# Patient Record
Sex: Female | Born: 1970
Health system: Southern US, Community
[De-identification: ages and names within clinical notes are randomized; demographics above are authoritative.]

## PROBLEM LIST (undated history)

## (undated) DIAGNOSIS — N390 Urinary tract infection, site not specified: Secondary | ICD-10-CM

## (undated) DIAGNOSIS — I10 Essential (primary) hypertension: Secondary | ICD-10-CM

## (undated) HISTORY — DX: Essential (primary) hypertension: I10

---

## 1999-06-07 ENCOUNTER — Inpatient Hospital Stay (HOSPITAL_COMMUNITY): Admission: AD | Admit: 1999-06-07 | Discharge: 1999-06-07 | Payer: Self-pay | Admitting: Obstetrics and Gynecology

## 1999-06-12 ENCOUNTER — Inpatient Hospital Stay (HOSPITAL_COMMUNITY): Admission: AD | Admit: 1999-06-12 | Discharge: 1999-06-14 | Payer: Self-pay | Admitting: Obstetrics and Gynecology

## 1999-07-17 ENCOUNTER — Other Ambulatory Visit: Admission: RE | Admit: 1999-07-17 | Discharge: 1999-07-17 | Payer: Self-pay | Admitting: Obstetrics and Gynecology

## 2000-05-20 ENCOUNTER — Encounter: Admission: RE | Admit: 2000-05-20 | Discharge: 2000-05-20 | Payer: Self-pay | Admitting: *Deleted

## 2000-05-20 ENCOUNTER — Encounter: Payer: Self-pay | Admitting: *Deleted

## 2001-07-16 ENCOUNTER — Other Ambulatory Visit: Admission: RE | Admit: 2001-07-16 | Discharge: 2001-07-16 | Payer: Self-pay | Admitting: Obstetrics and Gynecology

## 2002-08-05 ENCOUNTER — Other Ambulatory Visit: Admission: RE | Admit: 2002-08-05 | Discharge: 2002-08-05 | Payer: Self-pay | Admitting: Obstetrics & Gynecology

## 2003-09-19 ENCOUNTER — Other Ambulatory Visit: Admission: RE | Admit: 2003-09-19 | Discharge: 2003-09-19 | Payer: Self-pay | Admitting: Obstetrics and Gynecology

## 2004-06-15 ENCOUNTER — Ambulatory Visit: Payer: Self-pay | Admitting: Nurse Practitioner

## 2004-07-13 ENCOUNTER — Ambulatory Visit: Payer: Self-pay | Admitting: Family Medicine

## 2004-07-13 ENCOUNTER — Ambulatory Visit: Payer: Self-pay | Admitting: *Deleted

## 2012-03-21 ENCOUNTER — Emergency Department (HOSPITAL_COMMUNITY)
Admission: EM | Admit: 2012-03-21 | Discharge: 2012-03-21 | Disposition: A | Discharge status: Home / Self Care | Payer: BC Managed Care – PPO | Source: Home / Self Care

## 2012-03-21 ENCOUNTER — Encounter (HOSPITAL_COMMUNITY): Payer: Self-pay | Admitting: *Deleted

## 2012-03-21 DIAGNOSIS — N390 Urinary tract infection, site not specified: Secondary | ICD-10-CM

## 2012-03-21 DIAGNOSIS — M65839 Other synovitis and tenosynovitis, unspecified forearm: Secondary | ICD-10-CM

## 2012-03-21 HISTORY — DX: Urinary tract infection, site not specified: N39.0

## 2012-03-21 LAB — POCT URINALYSIS DIP (DEVICE)
Bilirubin Urine: NEGATIVE
Glucose, UA: NEGATIVE mg/dL
Ketones, ur: NEGATIVE mg/dL
Nitrite: POSITIVE — AB
Protein, ur: 30 mg/dL — AB
Specific Gravity, Urine: 1.02 (ref 1.005–1.030)
Urobilinogen, UA: 1 mg/dL (ref 0.0–1.0)
pH: 6.5 (ref 5.0–8.0)

## 2012-03-21 MED ORDER — CEPHALEXIN 500 MG PO CAPS: 500.0000 mg | ORAL_CAPSULE | Freq: Four times a day (QID) | ORAL | Status: DC

## 2012-03-21 NOTE — ED Provider Notes (Signed)
History     CSN: [id number]  Arrival date & time 03/21/12  1217   First MD Initiated Contact with Patient 03/21/12 1343      Chief Complaint  Patient presents with  . Hand Pain  . Urinary Tract Infection    (Consider location/radiation/quality/duration/timing/severity/associated sxs/prior treatment) HPI Comments: 42 year old female is complaining of pain in the right thumb. 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 These movements are repetitive on an everyday basis. The patient is located on the extensor surface of the and extends to the wrist. There is no history of blunt or other trauma. The pain began approximately 8 days ago  She has complained of dysuria and urinary frequency. She does have a history of urinary tract infections. Denies fever, chills, abdominal pain or pelvic pain.   Past Medical History  Diagnosis Date  . Frequent UTI     History reviewed. No pertinent past surgical history.  No family history on file.  History  Substance Use Topics  . Smoking status: Never Smoker   . Smokeless tobacco: Not on file  . Alcohol Use: No    OB History   Grav Para Term Preterm Abortions TAB SAB Ect Mult Living                  Review of Systems  Constitutional: Negative for fever, chills and activity change.  HENT: Negative.   Respiratory: Negative.   Cardiovascular: Negative.   Genitourinary: Positive for dysuria and frequency. Negative for vaginal bleeding and vaginal discharge.  Musculoskeletal:       As per HPI  Skin: Negative for color change, pallor and rash.  Neurological: Negative.     Allergies  Cyclobenzaprine  Home Medications   Current Outpatient Rx  Name  Route  Sig  Dispense  Refill  . UNKNOWN TO PATIENT      BCPs         . cephALEXin (KEFLEX) 500 MG capsule   Oral   Take 1 capsule (500 mg total) by mouth 4 (four) times daily.   28 capsule   0     BP 146/80  Pulse 77  Temp(Src)  98.9 F (37.2 C) (Oral)  SpO2 100%  LMP 02/29/2012  Physical Exam  Nursing note and vitals reviewed. Constitutional: She is oriented to person, place, and time. She appears well-developed and well-nourished. No distress.  Eyes: EOM are normal.  Neck: Normal range of motion. Neck supple.  Cardiovascular: Normal rate.   Pulmonary/Chest: Effort normal. No respiratory distress.  Musculoskeletal:  Tenderness over the extensor surface of the thumb. There is no deformity or swelling. Positive Finkelstein sign. Distal neurovascular motor sensory is intact. No other digits involved.  Neurological: She is alert and oriented to person, place, and time. No cranial nerve deficit.  Skin: Skin is warm and dry.  Psychiatric: She has a normal mood and affect.    ED Course  Procedures (including critical care time)  Labs Reviewed  POCT URINALYSIS DIP (DEVICE) - Abnormal; Notable for the following:    Hgb urine dipstick SMALL (*)    Protein, ur 30 (*)    Nitrite POSITIVE (*)    Leukocytes, UA TRACE (*)    All other components within normal limits  URINE CULTURE   No results found.   1. Tendinitis of thumb   2. UTI (lower urinary tract infection)       MDM  Keflex 500 mg 4  times a day for 7 days Drink plenty of fluids and stay well-hydrated Apply a splint to the extensor aspect of the right thumb. She is to wear this during work and home activities. Apply ice off and on during the day, before and after work. May take at DL 454 mg every 6-8 hours when necessary pain. The combination of splinting, ice and NSAIDs should help with the pain. Followup with your primary care doctor as needed or if worse may return.        Hayden Rasmussen, NP 03/21/12 1424

## 2012-03-21 NOTE — ED Provider Notes (Signed)
Medical screening examination/treatment/procedure(s) were performed by resident physician or non-physician practitioner and as supervising physician I was immediately available for consultation/collaboration.   Barkley Bruns MD.   Linna Hoff, MD 03/21/12 1539

## 2012-03-21 NOTE — ED Notes (Signed)
C/O right thumb pain extending into hand started 9 days ago; denies injury; c/o swelling to area.  Describes pain as constant "burning" with sharp pains if any movement.  Has taken Advil without much relief.  Also started with urinary urgency 2 days ago with slight dysuria.  Denies any pain at present, but concerned about possible UTI - has hx of frequent UTIs.

## 2012-03-23 LAB — URINE CULTURE
Colony Count: >=100000
Special Requests: NORMAL

## 2012-03-24 NOTE — ED Notes (Signed)
Urine culture: >100,000 colonies E. Coli.  Pt. adequately treated with Keflex. Vassie Moselle 03/24/2012

## 2012-04-15 ENCOUNTER — Other Ambulatory Visit: Payer: Self-pay | Admitting: Obstetrics and Gynecology

## 2012-04-15 DIAGNOSIS — R928 Other abnormal and inconclusive findings on diagnostic imaging of breast: Secondary | ICD-10-CM

## 2012-04-23 ENCOUNTER — Ambulatory Visit
Admission: RE | Admit: 2012-04-23 | Discharge: 2012-04-23 | Disposition: A | Discharge status: Home / Self Care | Payer: BC Managed Care – PPO | Source: Ambulatory Visit | Attending: Obstetrics and Gynecology | Admitting: Obstetrics and Gynecology

## 2012-04-23 DIAGNOSIS — R928 Other abnormal and inconclusive findings on diagnostic imaging of breast: Secondary | ICD-10-CM

## 2012-04-23 IMAGING — MG MM DIAGNOSTIC UNILATERAL R
3 series · 3 of 3 positions shown · non-contrast
Comparison: None.

CLINICAL DATA: The patient returns for evaluation of a possible
masses in the right breast noted on recent baseline screening
mammogram from [HOSPITAL] [facility] dated [DATE].

DIGITAL DIAGNOSTIC RIGHT MAMMOGRAM  AND RIGHT BREAST ULTRASOUND:

[R CC (1 of 2)]
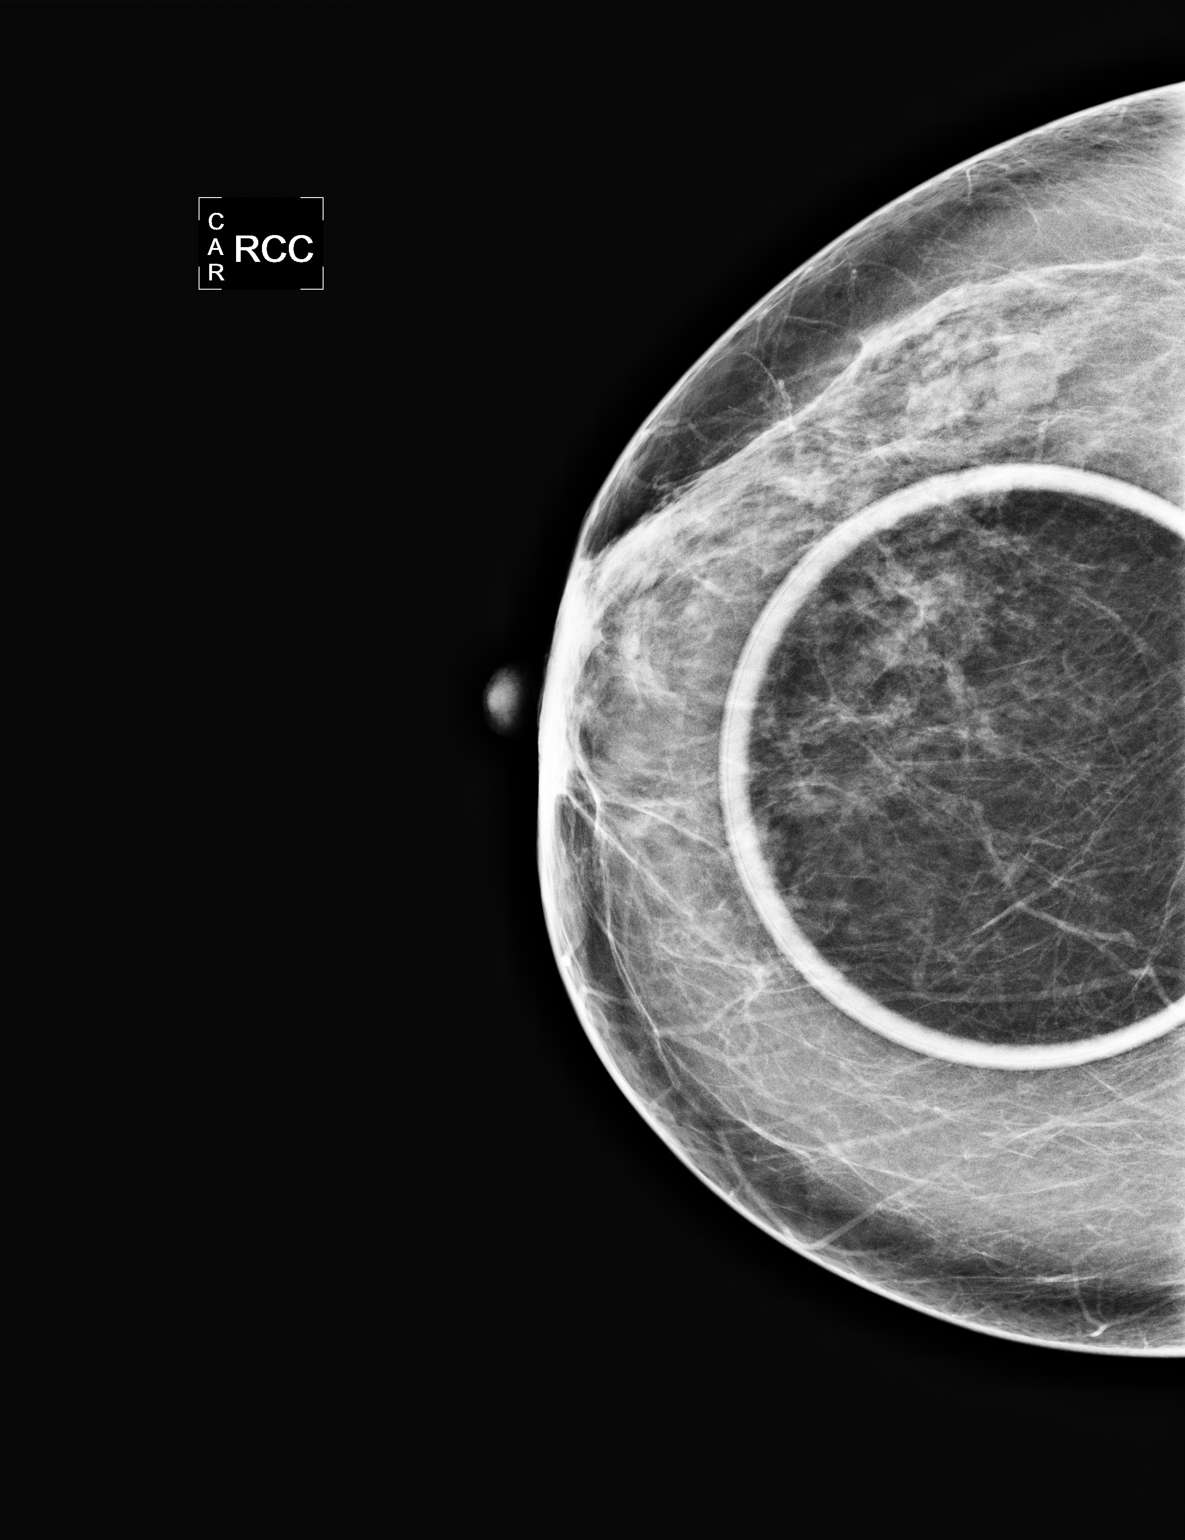

[R MLO]
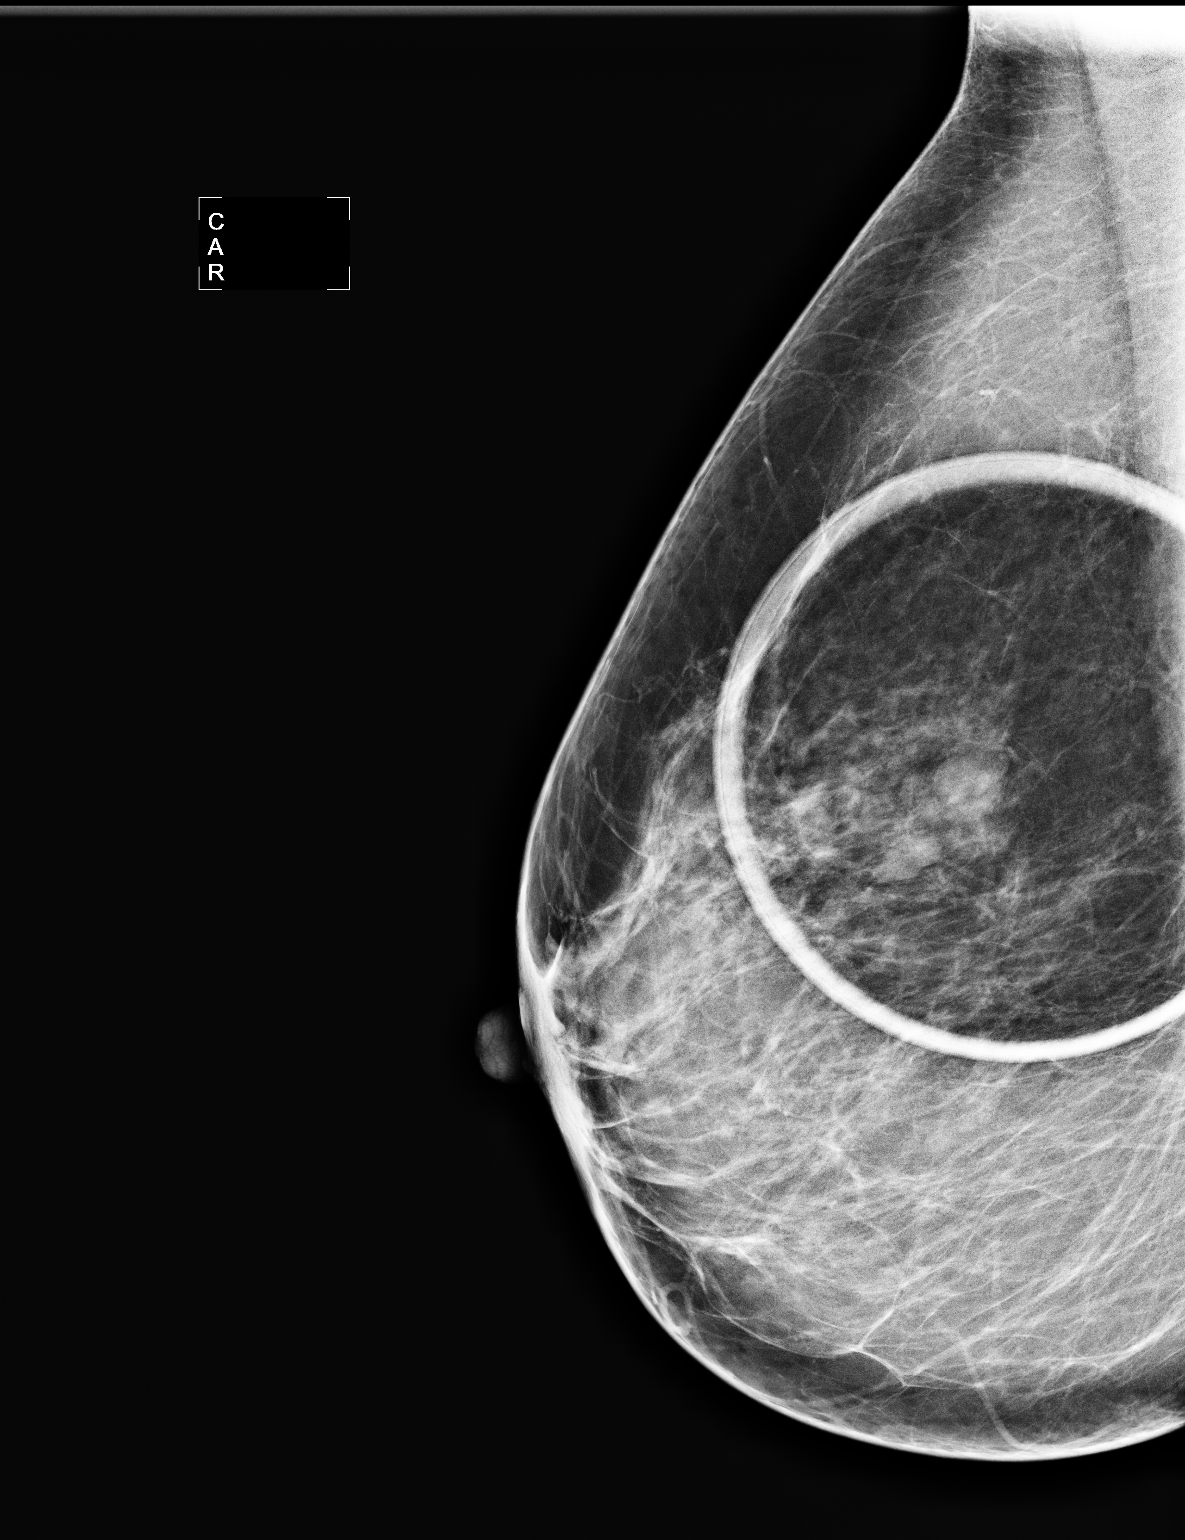

[R CC (2 of 2)]
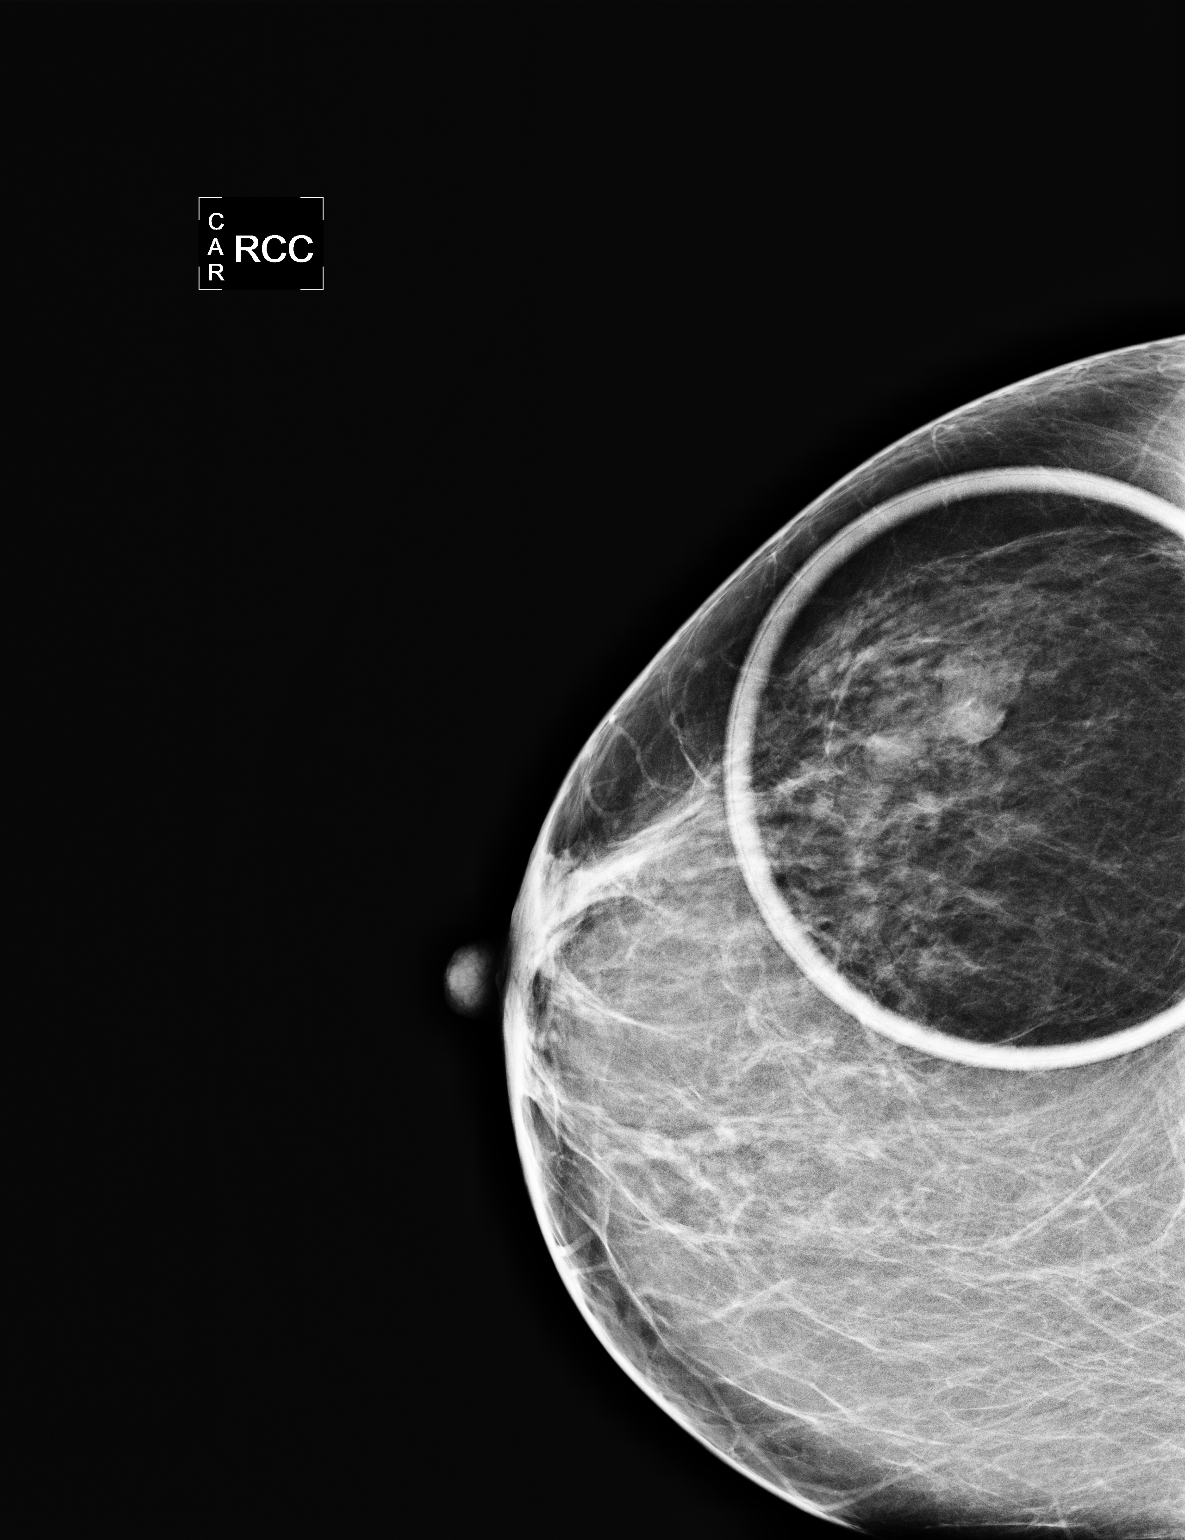

[3 of 3 positions shown; findings below may reference images not displayed]

FINDINGS: ACR Breast Density Category 2: There are scattered fibroglandular
densities.

No persistent worrisome mass is noted in the central portion of the
right breast on the CC view.  Additional views of the outer portion
of the right breast demonstrate multiple partially obscured
partially circumscribed round and oval masses in the mid to
posterior outer breast.

On physical exam, no mass is palpated in the outer portion of the
right breast.

Ultrasound is performed, showing multiple cysts at 9 o'clock in the
right breast with the largest being 6 cm from the nipple measuring
1.1 x 0.5 x 0.7 cm.
IMPRESSION: Right breast cysts.  No mammographic or sonographic evidence of
malignancy.

RECOMMENDATION:
Yearly screening mammography is suggested.

I have discussed the findings and recommendations with the patient.
Results were also provided in writing at the conclusion of the
visit.  If applicable, a reminder letter will be sent to the
patient regarding the next appointment.

BI-RADS CATEGORY 2:  Benign finding(s).

## 2012-04-23 IMAGING — US US BREAST*R*
1 series · 9 of 9 positions shown · non-contrast
Comparison: None.

CLINICAL DATA: The patient returns for evaluation of a possible
masses in the right breast noted on recent baseline screening
mammogram from [HOSPITAL] OB/[facility] dated [DATE].

DIGITAL DIAGNOSTIC RIGHT MAMMOGRAM  AND RIGHT BREAST ULTRASOUND:

[Series 1: us breast*right* · 9 of 9 slices shown]
[im 1/9]
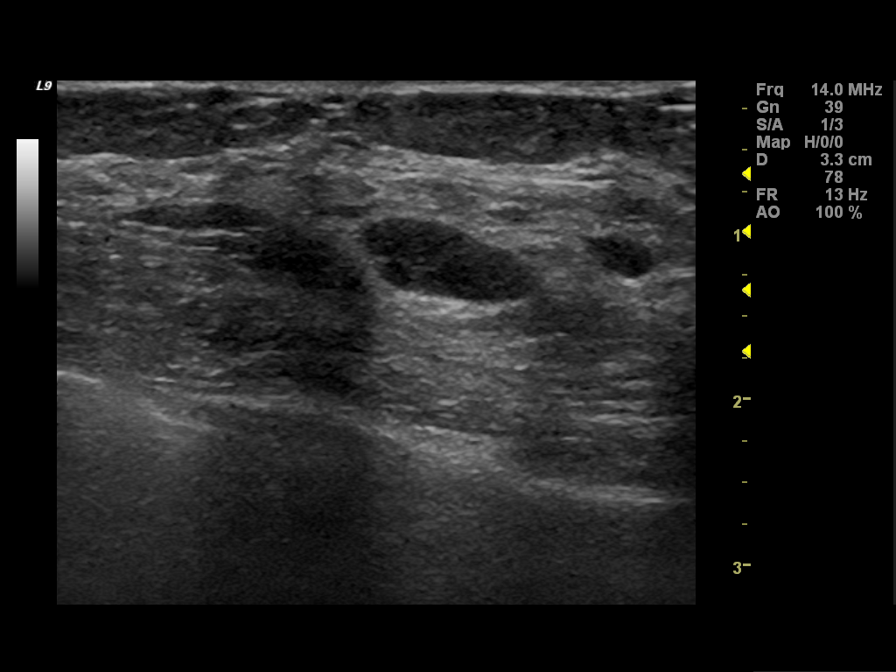
[im 2/9]
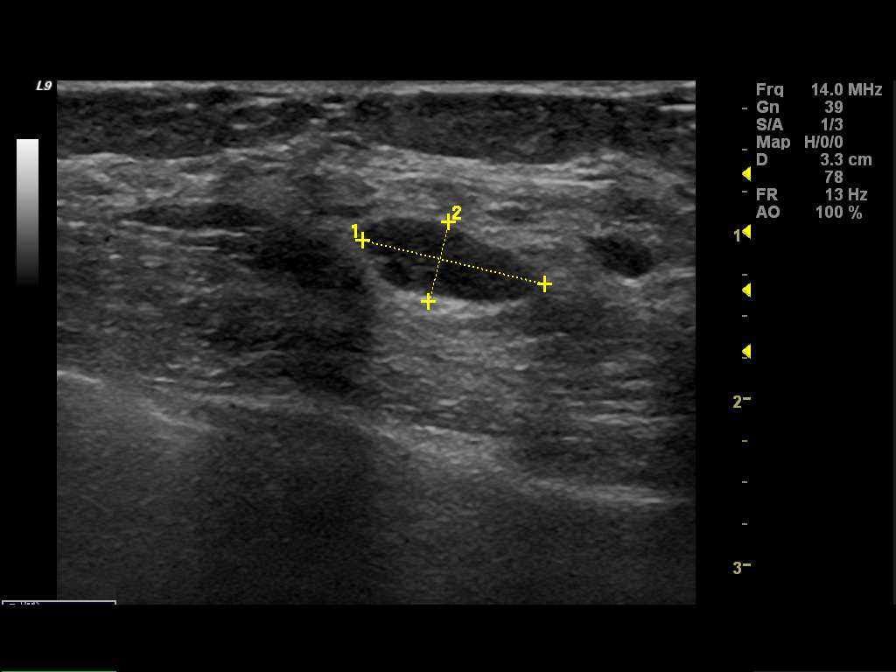
[im 3/9]
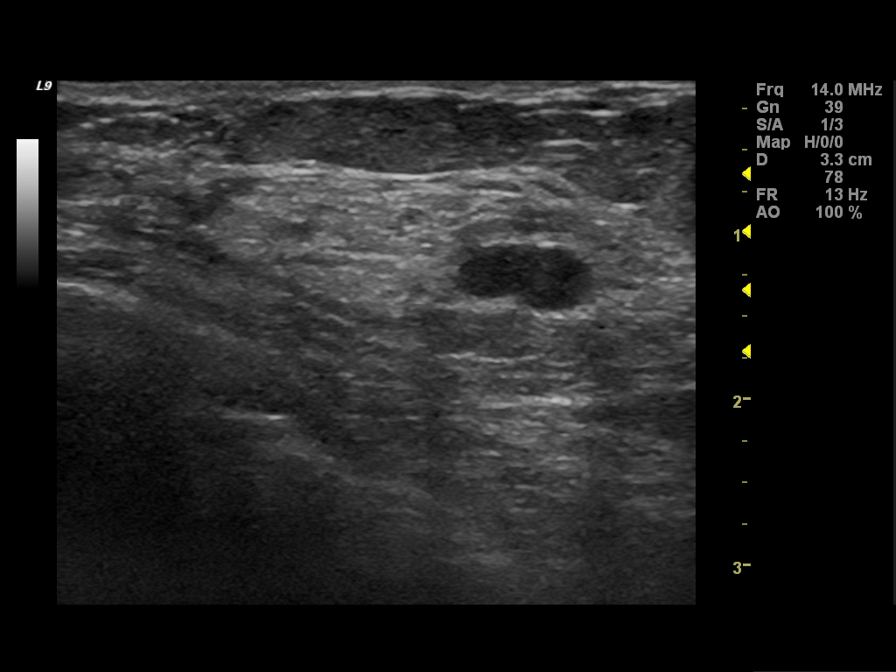
[im 4/9]
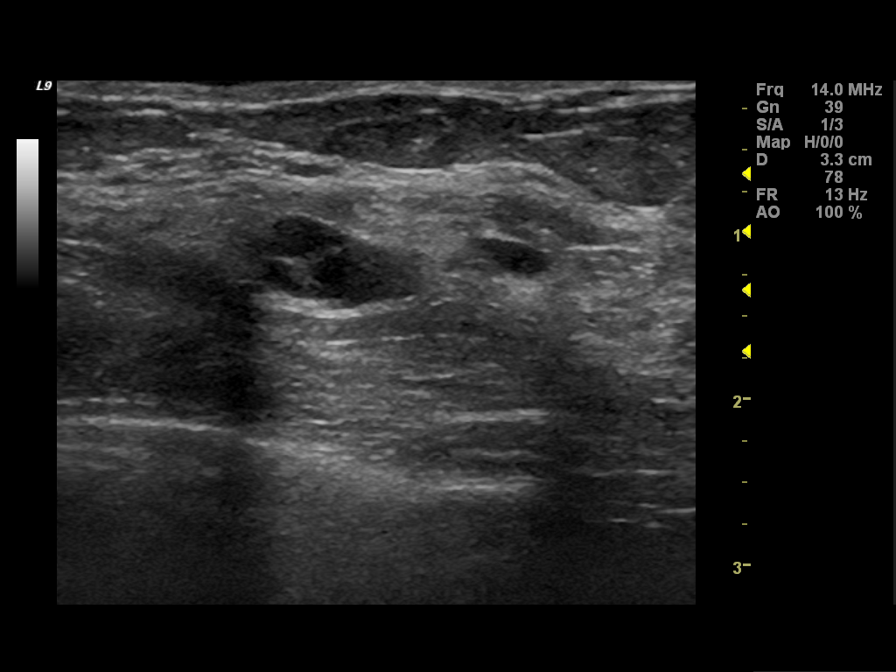
[im 5/9]
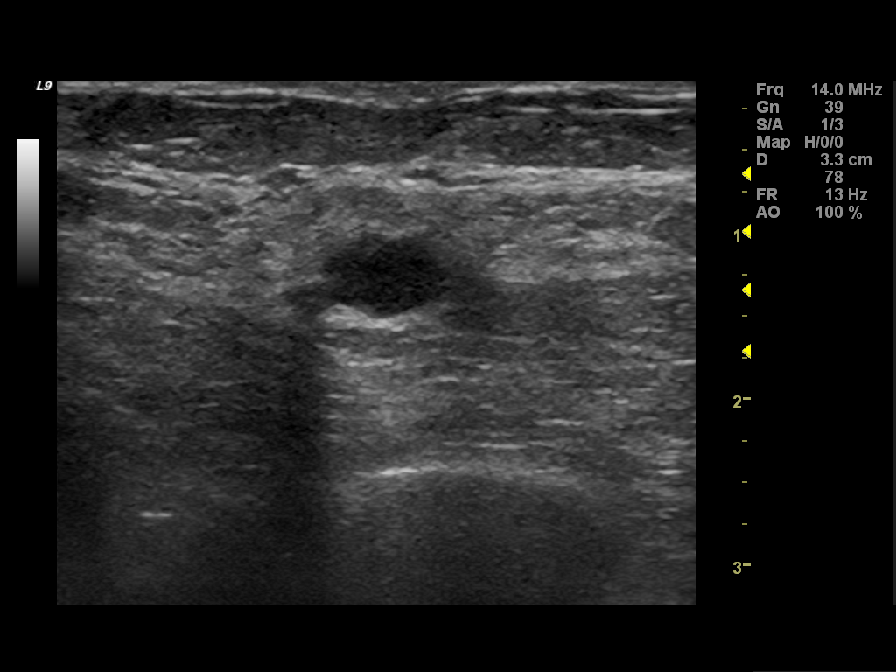
[im 6/9]
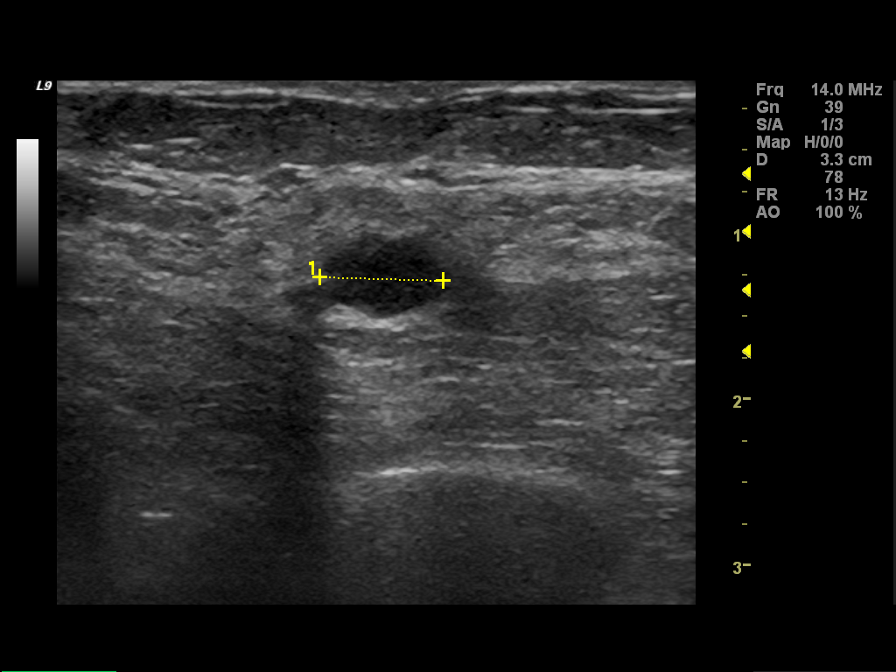
[im 7/9]
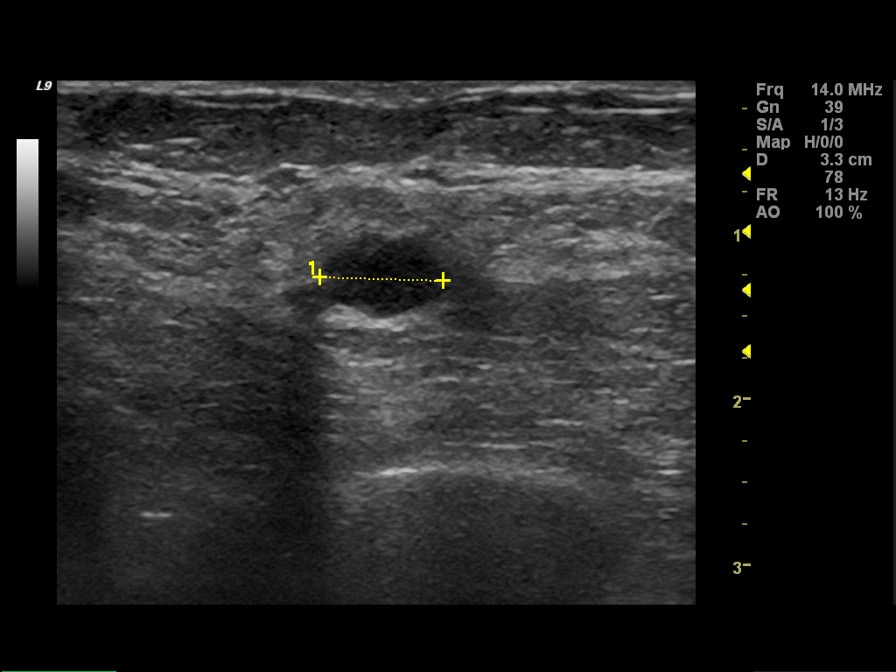
[im 8/9]
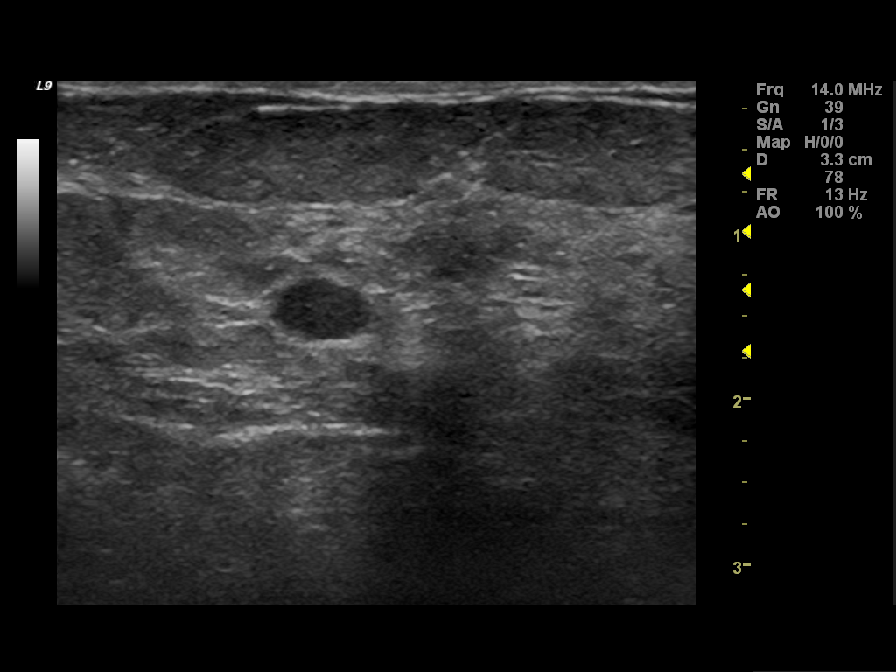
[im 9/9]
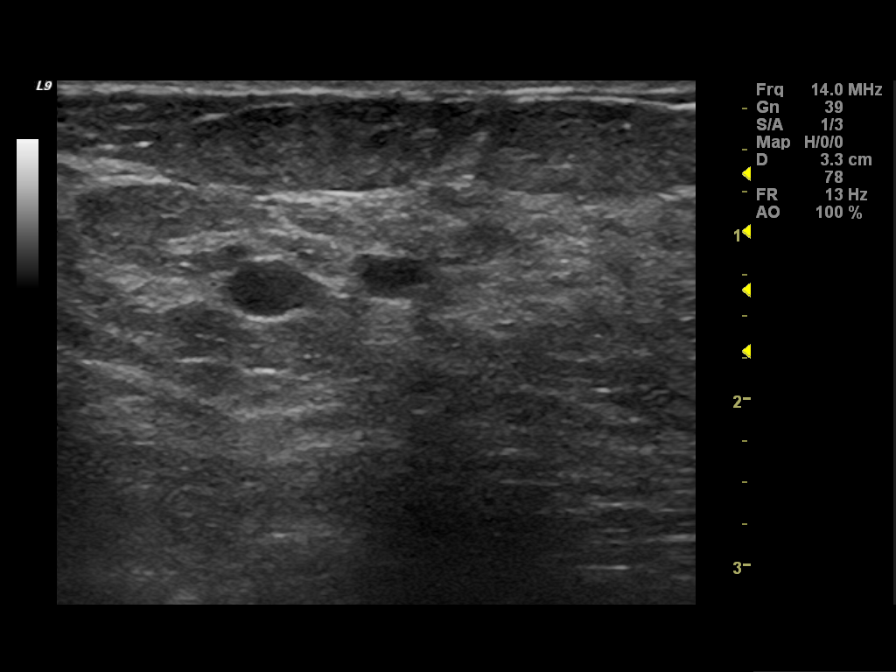

[9 of 9 positions shown; findings below may reference images not displayed]

FINDINGS: ACR Breast Density Category 2: There are scattered fibroglandular
densities.

No persistent worrisome mass is noted in the central portion of the
right breast on the CC view.  Additional views of the outer portion
of the right breast demonstrate multiple partially obscured
partially circumscribed round and oval masses in the mid to
posterior outer breast.

On physical exam, no mass is palpated in the outer portion of the
right breast.

Ultrasound is performed, showing multiple cysts at 9 o'clock in the
right breast with the largest being 6 cm from the nipple measuring
1.1 x 0.5 x 0.7 cm.
IMPRESSION: Right breast cysts.  No mammographic or sonographic evidence of
malignancy.

RECOMMENDATION:
Yearly screening mammography is suggested.

I have discussed the findings and recommendations with the patient.
Results were also provided in writing at the conclusion of the
visit.  If applicable, a reminder letter will be sent to the
patient regarding the next appointment.

BI-RADS CATEGORY 2:  Benign finding(s).

## 2012-10-09 ENCOUNTER — Telehealth: Payer: Self-pay | Admitting: *Deleted

## 2012-10-09 NOTE — Telephone Encounter (Signed)
rx sent

## 2013-03-26 DIAGNOSIS — I10 Essential (primary) hypertension: Secondary | ICD-10-CM | POA: Insufficient documentation

## 2013-04-13 LAB — HM PAP SMEAR: HM PAP: NEGATIVE

## 2014-12-23 DIAGNOSIS — F419 Anxiety disorder, unspecified: Secondary | ICD-10-CM | POA: Insufficient documentation

## 2015-05-17 ENCOUNTER — Other Ambulatory Visit: Payer: Self-pay | Admitting: Obstetrics and Gynecology

## 2015-05-17 DIAGNOSIS — R928 Other abnormal and inconclusive findings on diagnostic imaging of breast: Secondary | ICD-10-CM

## 2015-05-30 ENCOUNTER — Ambulatory Visit
Admission: RE | Admit: 2015-05-30 | Discharge: 2015-05-30 | Disposition: A | Discharge status: Home / Self Care | Payer: BLUE CROSS/BLUE SHIELD | Source: Ambulatory Visit | Attending: Obstetrics and Gynecology | Admitting: Obstetrics and Gynecology

## 2015-05-30 DIAGNOSIS — R928 Other abnormal and inconclusive findings on diagnostic imaging of breast: Secondary | ICD-10-CM

## 2015-05-30 IMAGING — MG MM DIAG BREAST TOMO UNI RIGHT
4 series · 4 of 12 positions shown · non-contrast
Comparison: Previous exam(s).

CLINICAL DATA: The patient was called back due to a growing right
breast mass.

EXAM:
2D DIGITAL DIAGNOSTIC RIGHT MAMMOGRAM WITH ADJUNCT TOMO
ULTRASOUND RIGHT BREAST

[R CC]
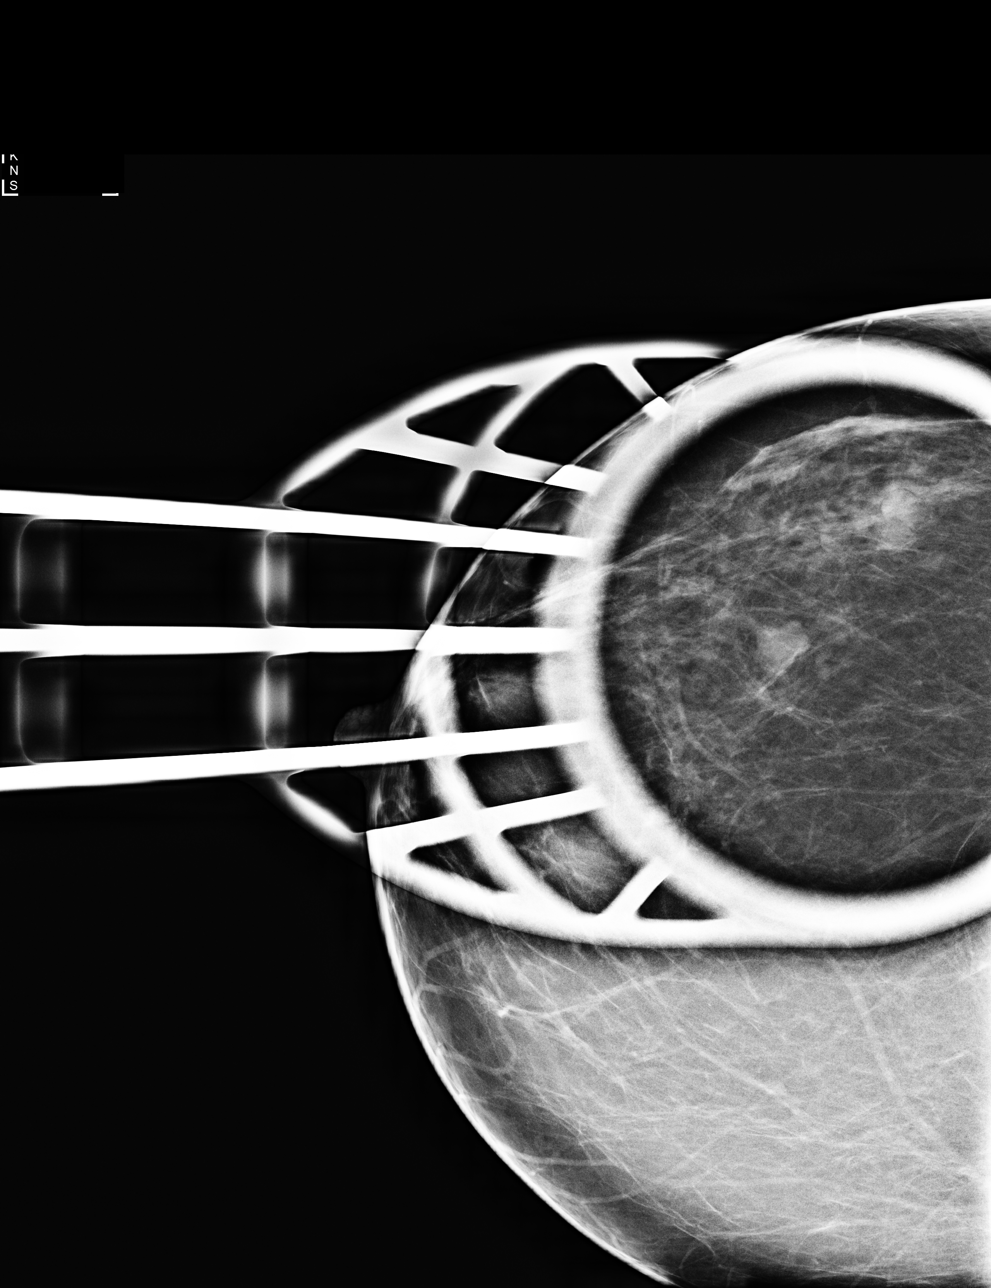

[R MLO]
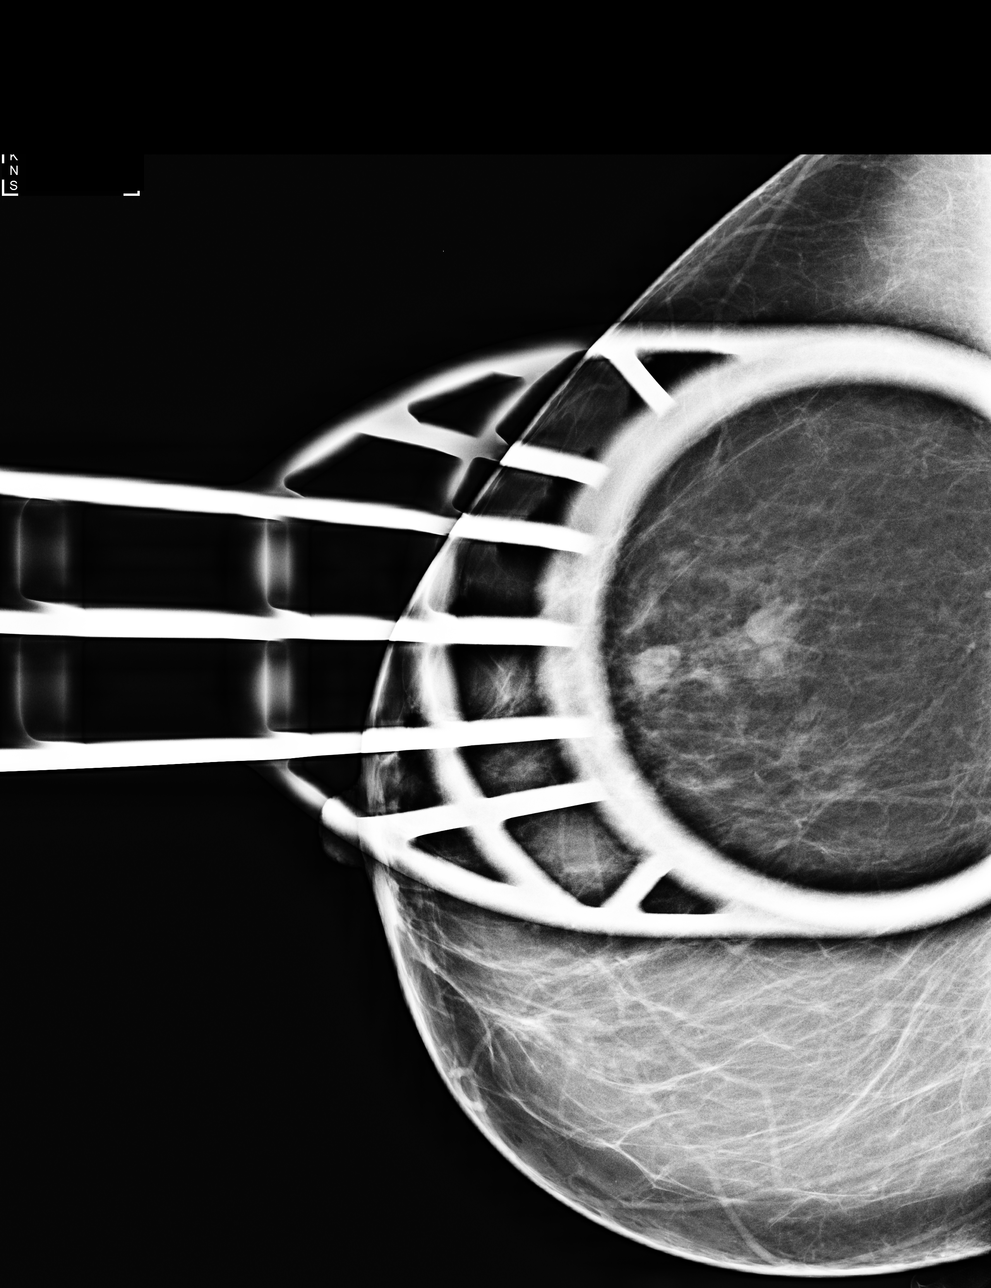

[R MLO tomo · tomo slice 31/61.0]
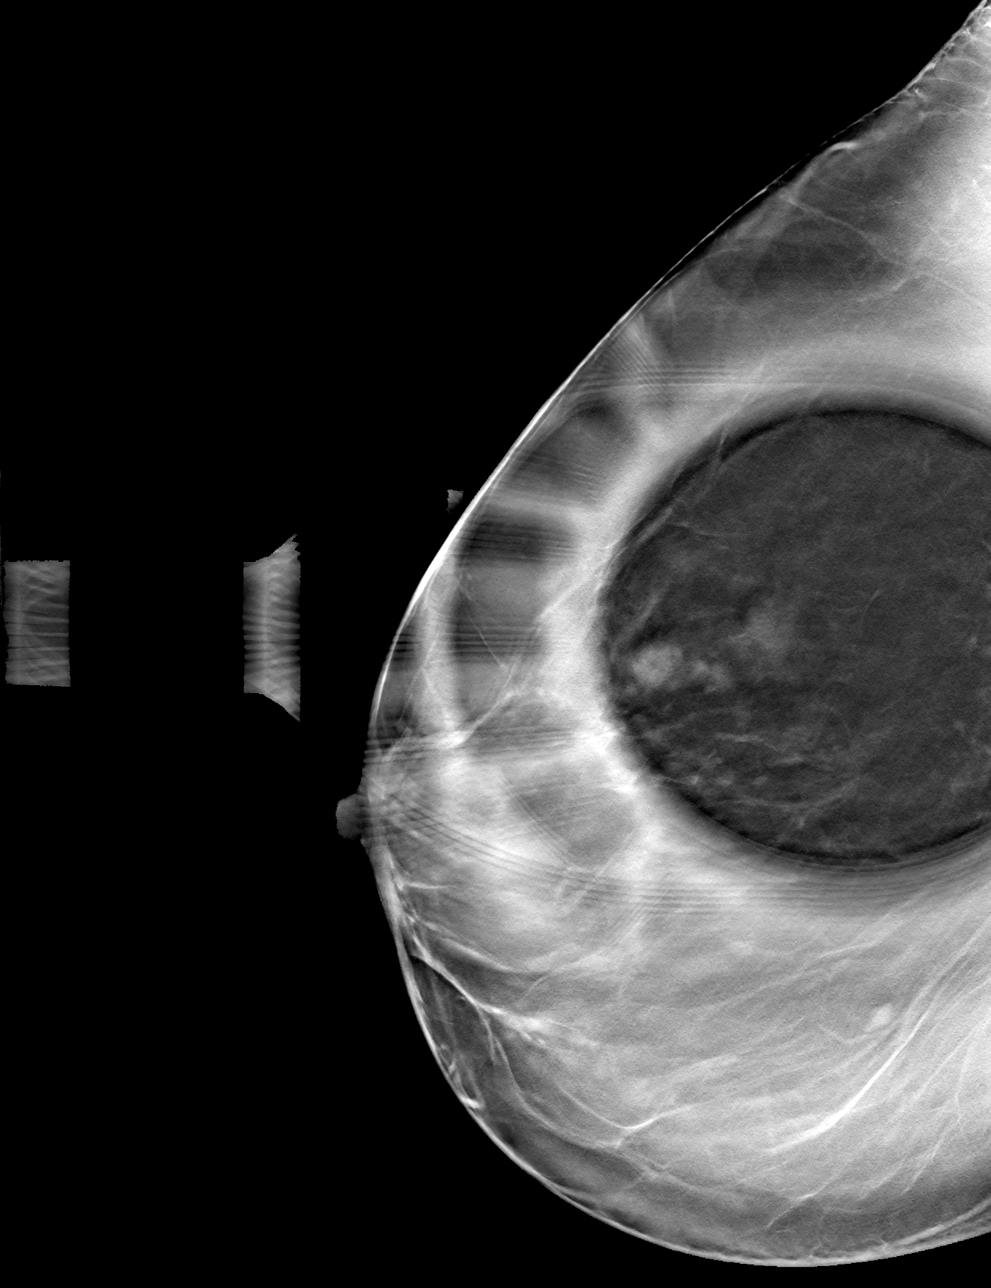

[R CC tomo · tomo slice 31/60.0]
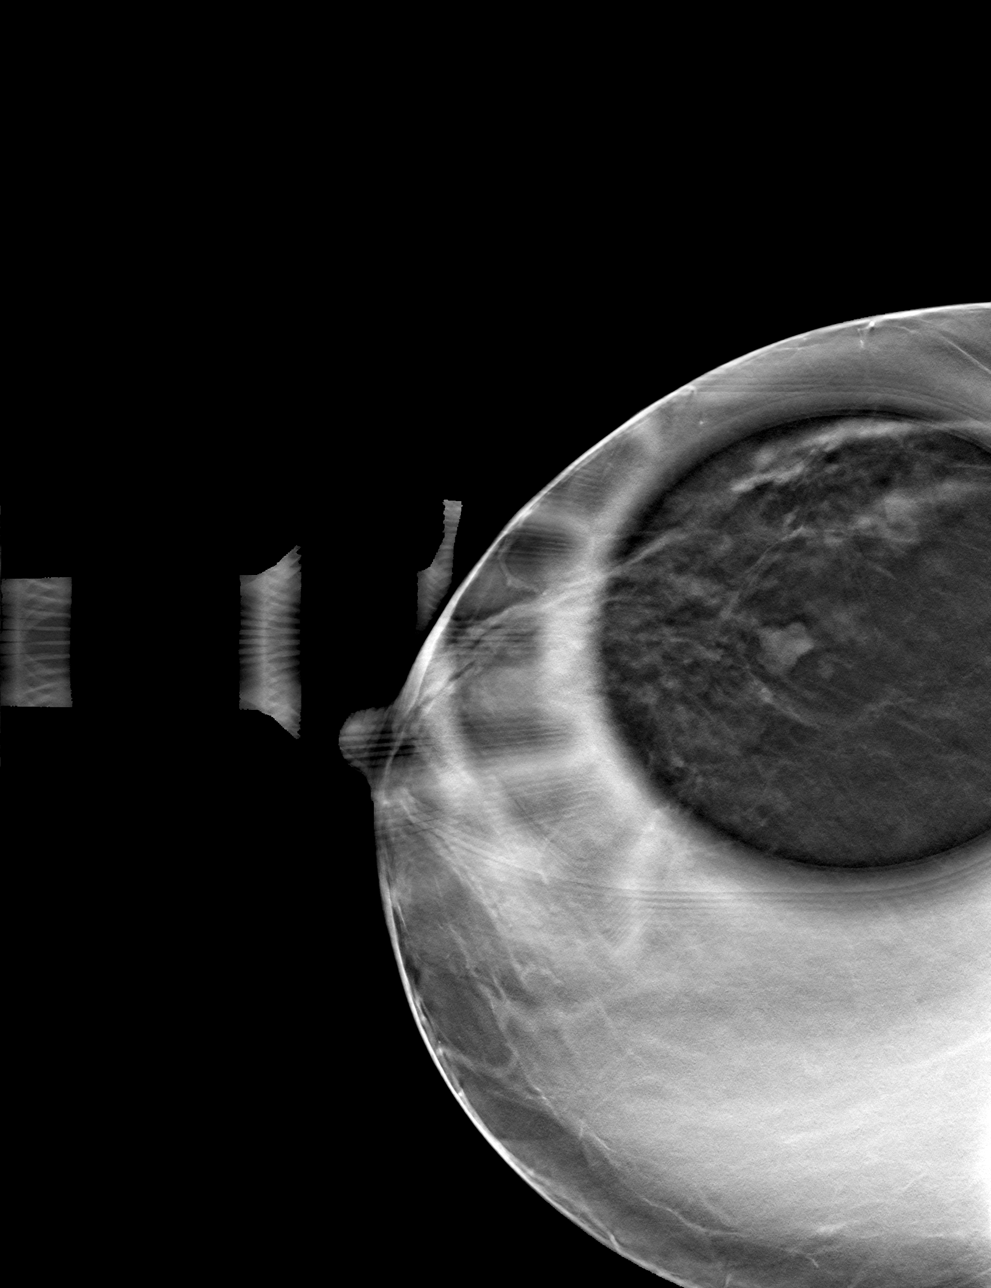

[4 of 12 positions shown; findings below may reference images not displayed]

ACR Breast Density Category b: There are scattered areas of
fibroglandular density.
FINDINGS: There is a cluster of masses in the lateral right breast. Several of
these are stable or smaller since the previous study and were
previously proven to represent cysts. However, there is a mass, a
little more anteriorly in the breast, which is larger in the
interval. No other suspicious findings.

On physical exam, no suspicious lumps are identified

Targeted ultrasound is performed, showing there are multiple cysts
in the lateral right breast. There is also a mass seen at 10
o'clock, 2 cm from the nipple, likely representing the new mass
which appears to represent a cluster of cysts. This mass measures 8
by 9 x 6 mm with increased through transmission.
IMPRESSION: The growing mass in the right breast is almost certainly a cluster
of cysts. No other suspicious findings.

RECOMMENDATION:
Six-month follow-up ultrasound and mammogram to ensure stability of
the probable cluster of cysts.

I have discussed the findings and recommendations with the patient.
Results were also provided in writing at the conclusion of the
visit. If applicable, a reminder letter will be sent to the patient
regarding the next appointment.

BI-RADS CATEGORY  3: Probably benign.

## 2015-07-08 ENCOUNTER — Other Ambulatory Visit: Payer: Self-pay | Admitting: Obstetrics and Gynecology

## 2015-07-08 DIAGNOSIS — N921 Excessive and frequent menstruation with irregular cycle: Secondary | ICD-10-CM

## 2015-07-14 ENCOUNTER — Ambulatory Visit
Admission: RE | Admit: 2015-07-14 | Discharge: 2015-07-14 | Disposition: A | Discharge status: Home / Self Care | Payer: BLUE CROSS/BLUE SHIELD | Source: Ambulatory Visit | Attending: Obstetrics and Gynecology | Admitting: Obstetrics and Gynecology

## 2015-07-14 DIAGNOSIS — N921 Excessive and frequent menstruation with irregular cycle: Secondary | ICD-10-CM

## 2015-07-14 IMAGING — US US PELVIS COMPLETE
1 series · 13 of 25 positions shown · non-contrast
Comparison: None

CLINICAL DATA: Abnormal vaginal bleeding.



[Series 1: us pelvis complete · 0.28mm/px · 13 of 58 slices shown]
[im 1/58]
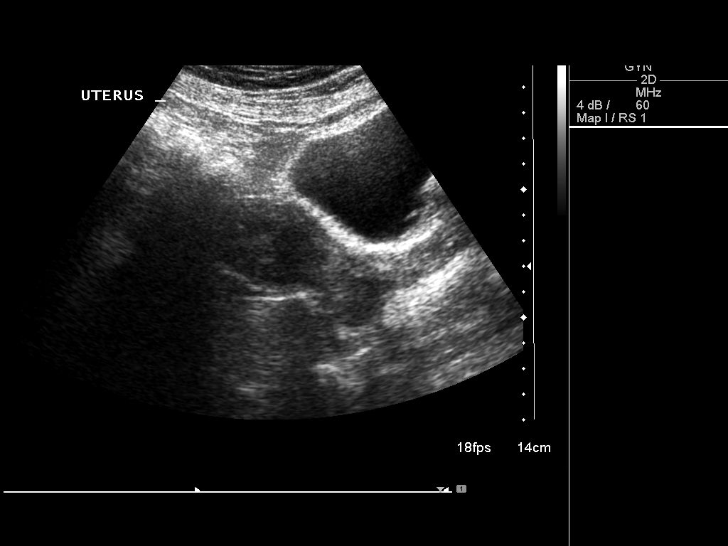
[im 5/58]
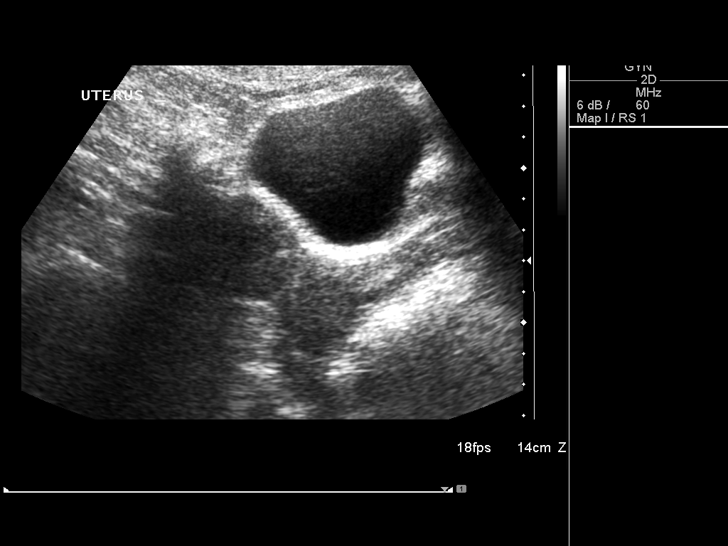
[im 10/58]
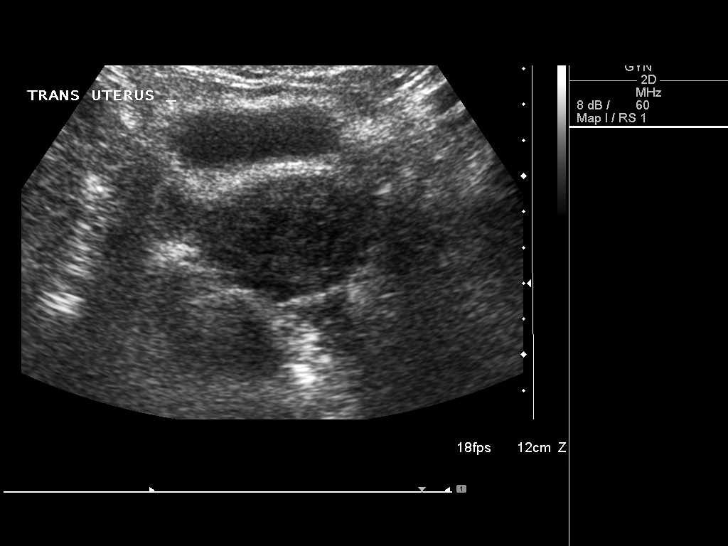
[im 15/58]
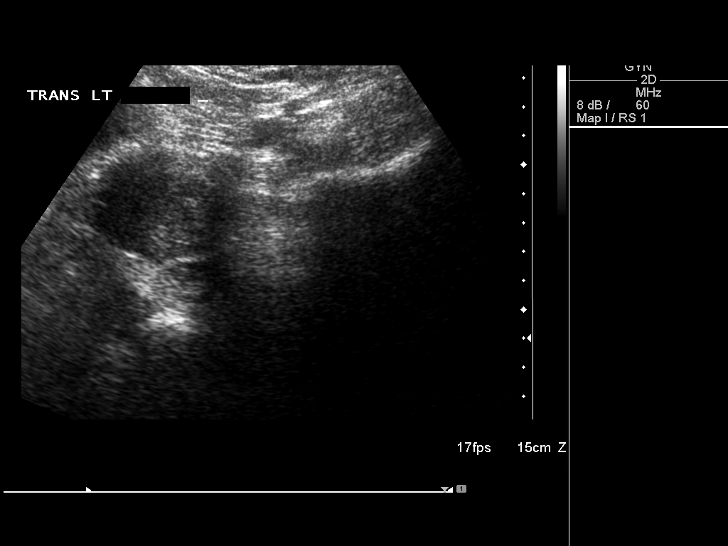
[im 20/58]
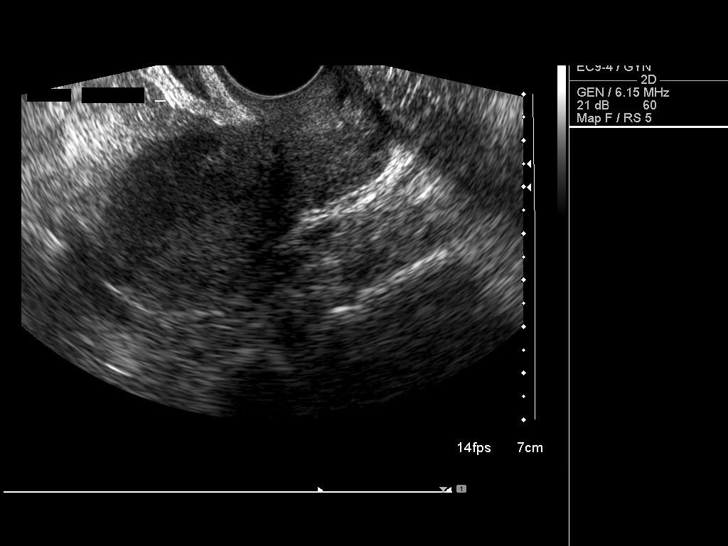
[im 24/58]
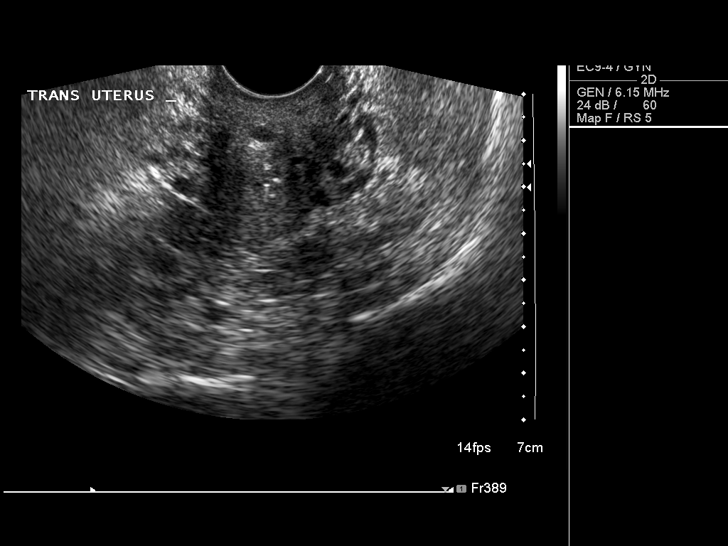
[im 29/58]
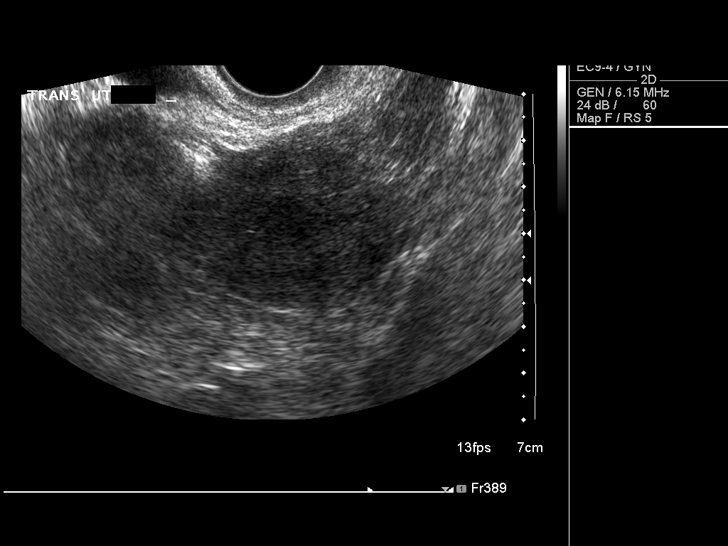
[im 34/58]
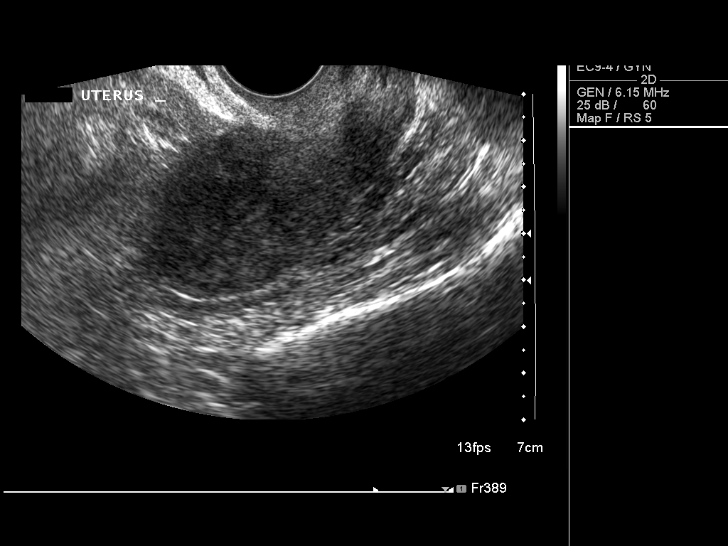
[im 39/58]
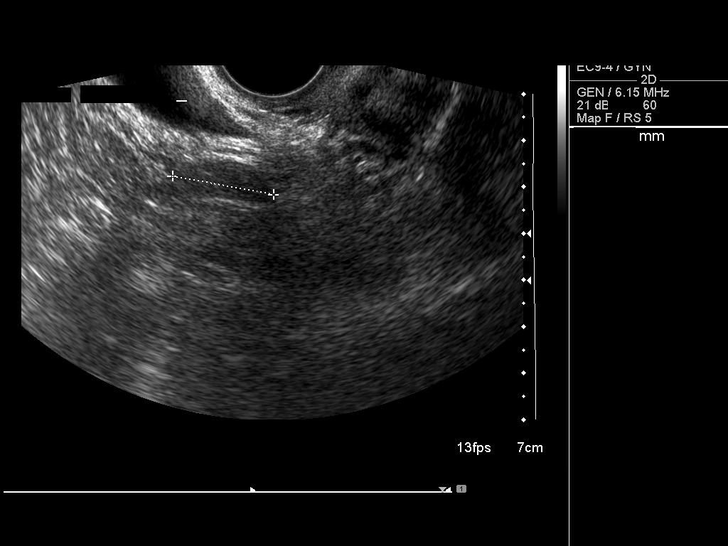
[im 43/58]
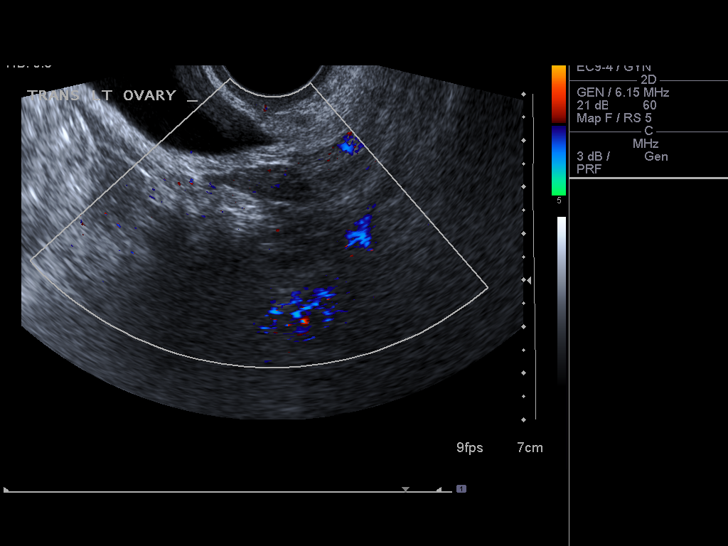
[im 48/58]
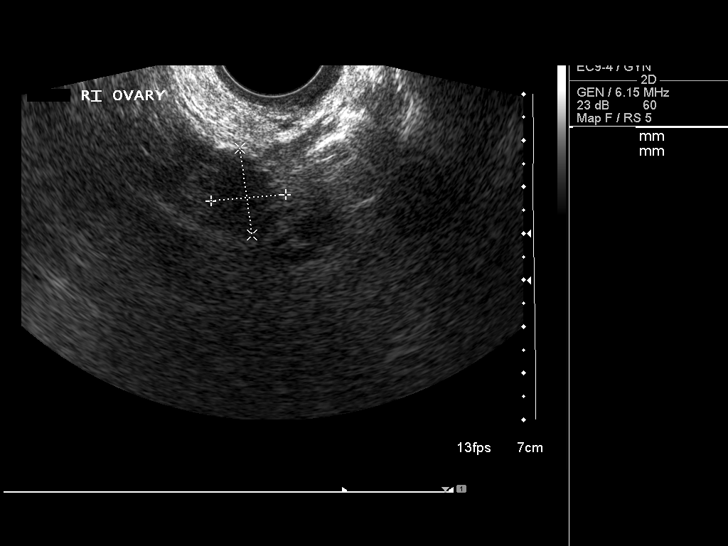
[im 53/58]
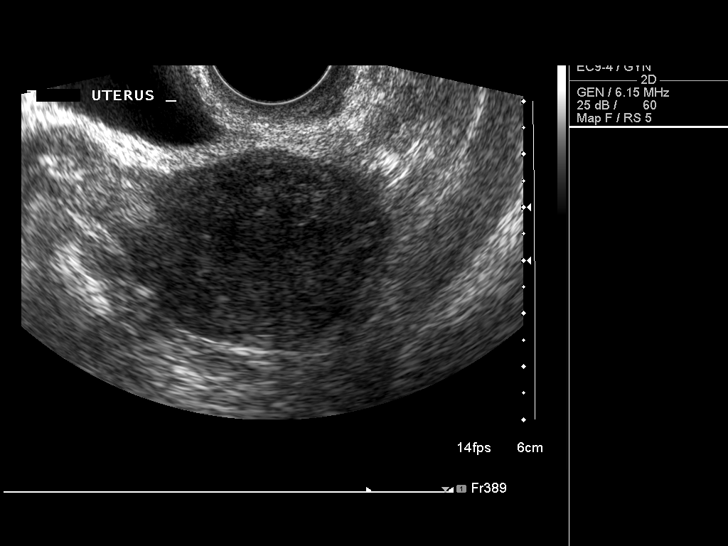
[im 58/58]
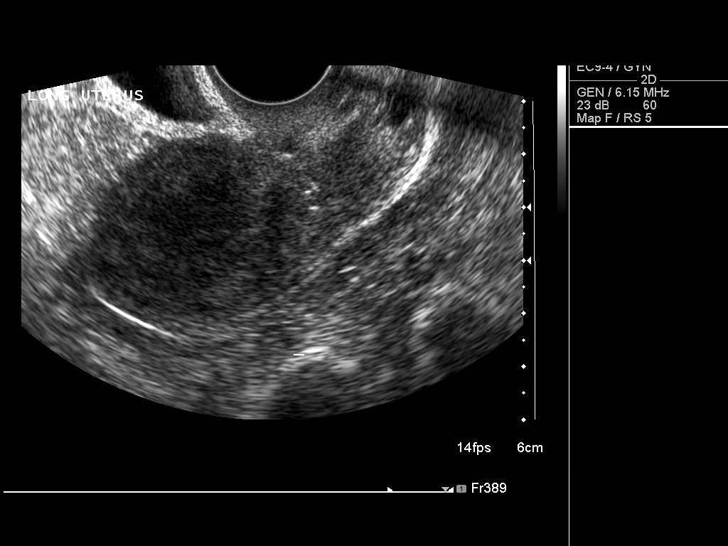

[13 of 25 positions shown; findings below may reference images not displayed]

FINDINGS: Uterus

Measurements: 7.8 x 3.6 x 5.3 cm. No discrete fibroids evident.
Myometrial echotexture is heterogeneous, raising the question of
adenomyosis.

Endometrium

Thickness: 7 mm.  No focal abnormality visualized.

Right ovary

Measurements: 1.6 x 1.9 x 2.4 cm. Normal appearance/no adnexal mass.

Left ovary

Measurements: 2.2 x 0.9 x 1.7 cm. Normal appearance/no adnexal mass.

Other findings

No abnormal free fluid.
IMPRESSION: Heterogeneous myometrial echotexture suggests adenomyosis. No
evidence for uterine fibroids.

Normal endometrial stripe thickness for premenopausal female. If
bleeding remains unresponsive to hormonal or medical therapy,
sonohysterogram should be considered for focal lesion work-up. (Ref:
Radiological Reasoning: Algorithmic Workup of Abnormal Vaginal
Bleeding with Endovaginal Sonography and Sonohysterography. AJR
[2F]; 191:S68-73)

## 2015-08-15 DIAGNOSIS — R350 Frequency of micturition: Secondary | ICD-10-CM | POA: Diagnosis not present

## 2015-08-15 DIAGNOSIS — Z6827 Body mass index (BMI) 27.0-27.9, adult: Secondary | ICD-10-CM | POA: Diagnosis not present

## 2015-11-09 ENCOUNTER — Other Ambulatory Visit: Payer: Self-pay | Admitting: Obstetrics and Gynecology

## 2015-11-09 DIAGNOSIS — N6001 Solitary cyst of right breast: Secondary | ICD-10-CM

## 2015-12-01 ENCOUNTER — Ambulatory Visit
Admission: RE | Admit: 2015-12-01 | Discharge: 2015-12-01 | Disposition: A | Discharge status: Home / Self Care | Payer: BLUE CROSS/BLUE SHIELD | Source: Ambulatory Visit | Attending: Obstetrics and Gynecology | Admitting: Obstetrics and Gynecology

## 2015-12-01 DIAGNOSIS — N6321 Unspecified lump in the left breast, upper outer quadrant: Secondary | ICD-10-CM | POA: Diagnosis not present

## 2015-12-01 DIAGNOSIS — N6001 Solitary cyst of right breast: Secondary | ICD-10-CM

## 2015-12-01 DIAGNOSIS — N6489 Other specified disorders of breast: Secondary | ICD-10-CM | POA: Diagnosis not present

## 2015-12-01 IMAGING — MG 2D DIGITAL DIAGNOSTIC UNILATERAL RIGHT MAMMOGRAM WITH CAD AND AD
6 series · 6 of 14 positions shown · non-contrast
Comparison: Previous exam(s).

CLINICAL DATA: Follow-up of probably benign right breast upper
outer quadrant mass.

EXAM:
2D DIGITAL DIAGNOSTIC RIGHT MAMMOGRAM WITH CAD AND ADJUNCT TOMO
ULTRASOUND RIGHT BREAST

[R CC synth-2D]
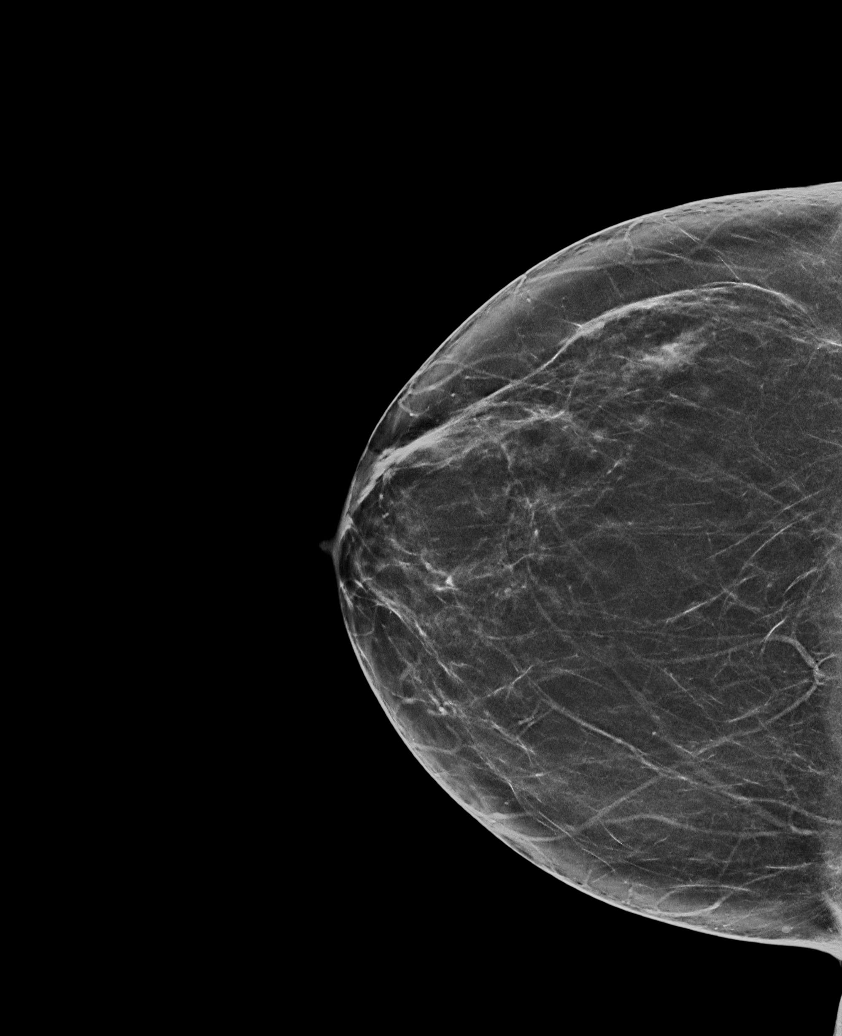

[R MLO synth-2D]
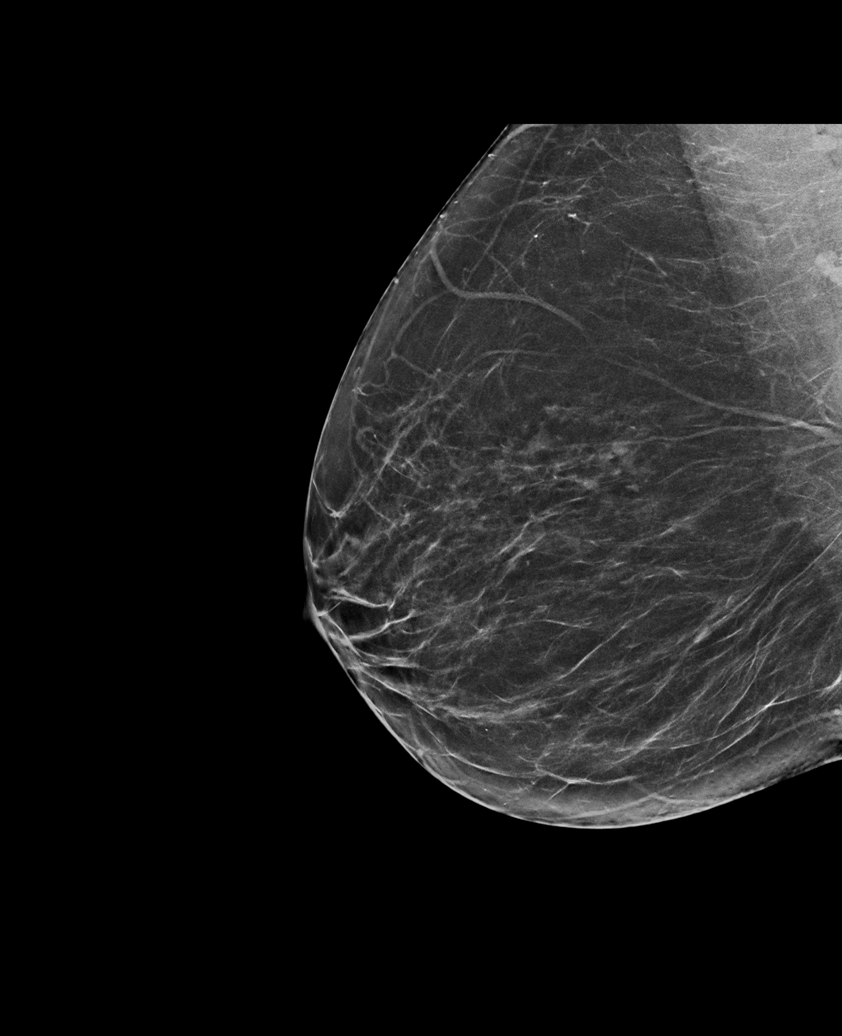

[R CC]
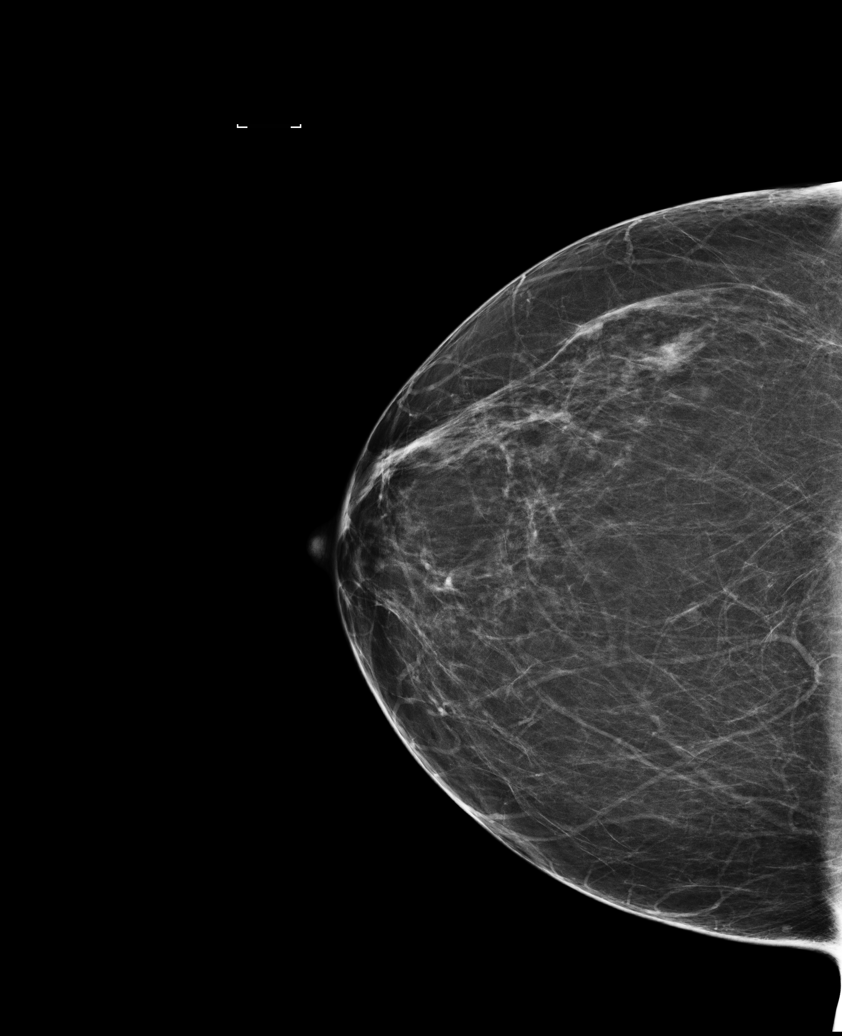

[R MLO]
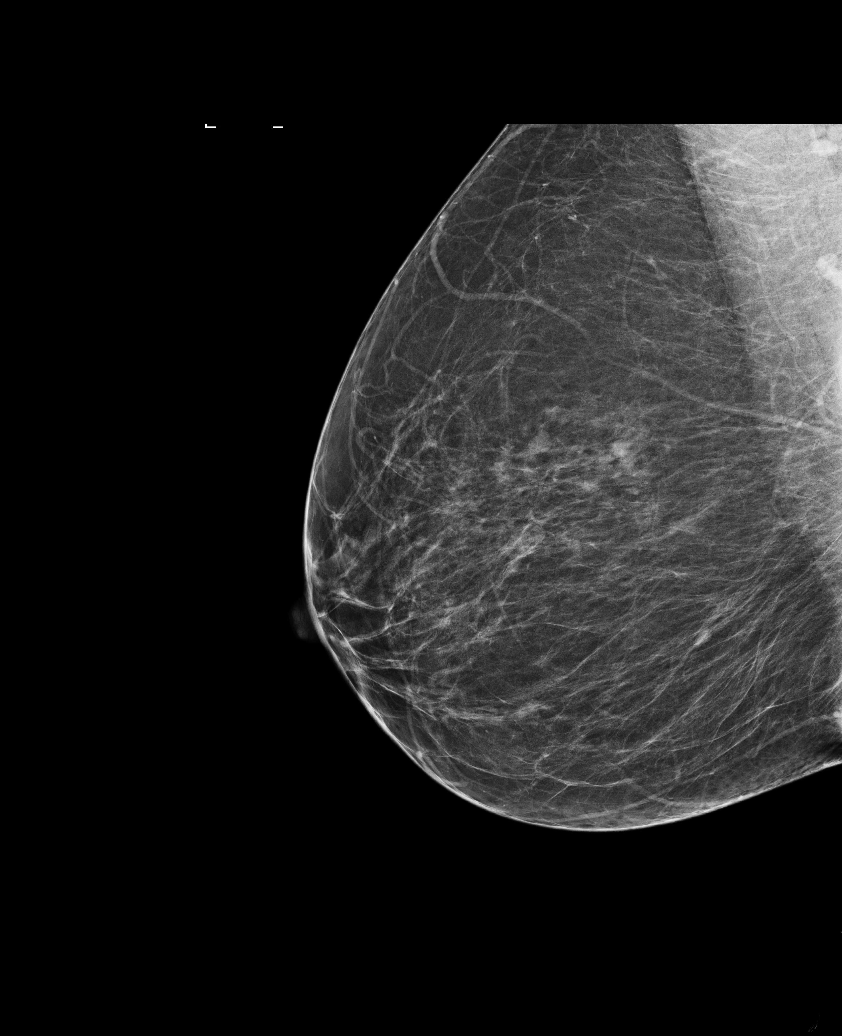

[R CC tomo · tomo slice 35/69.0]
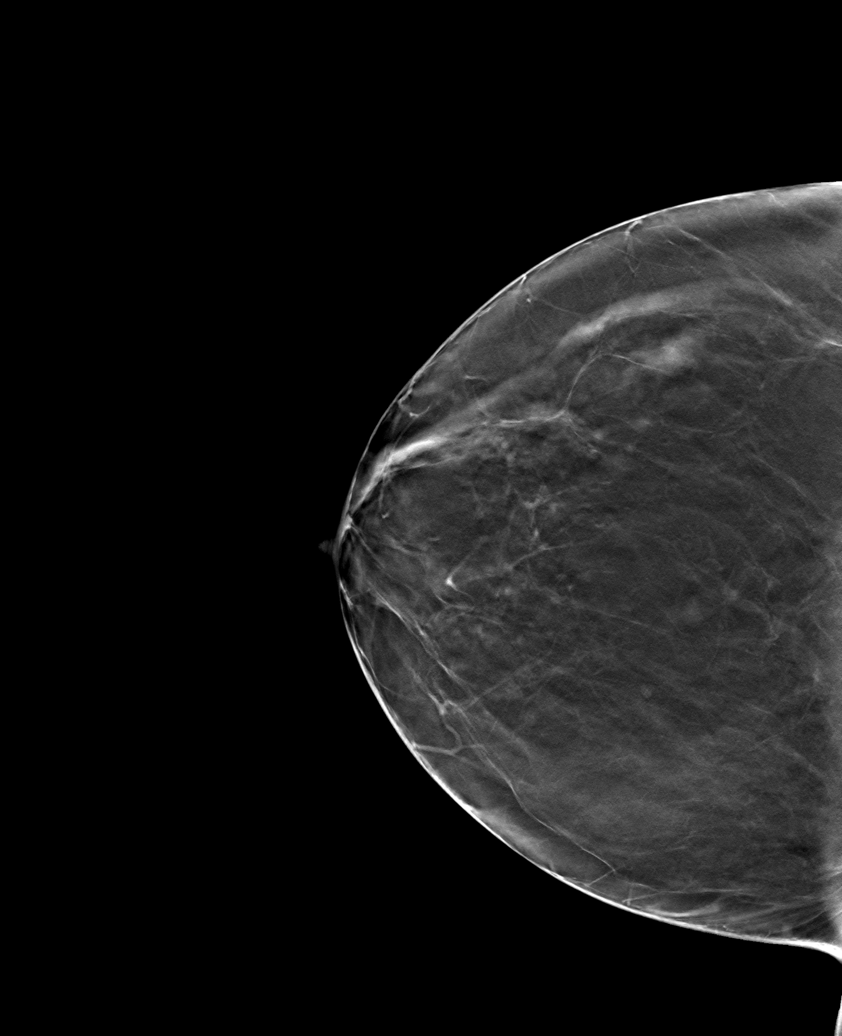

[R MLO tomo · tomo slice 35/68.0]
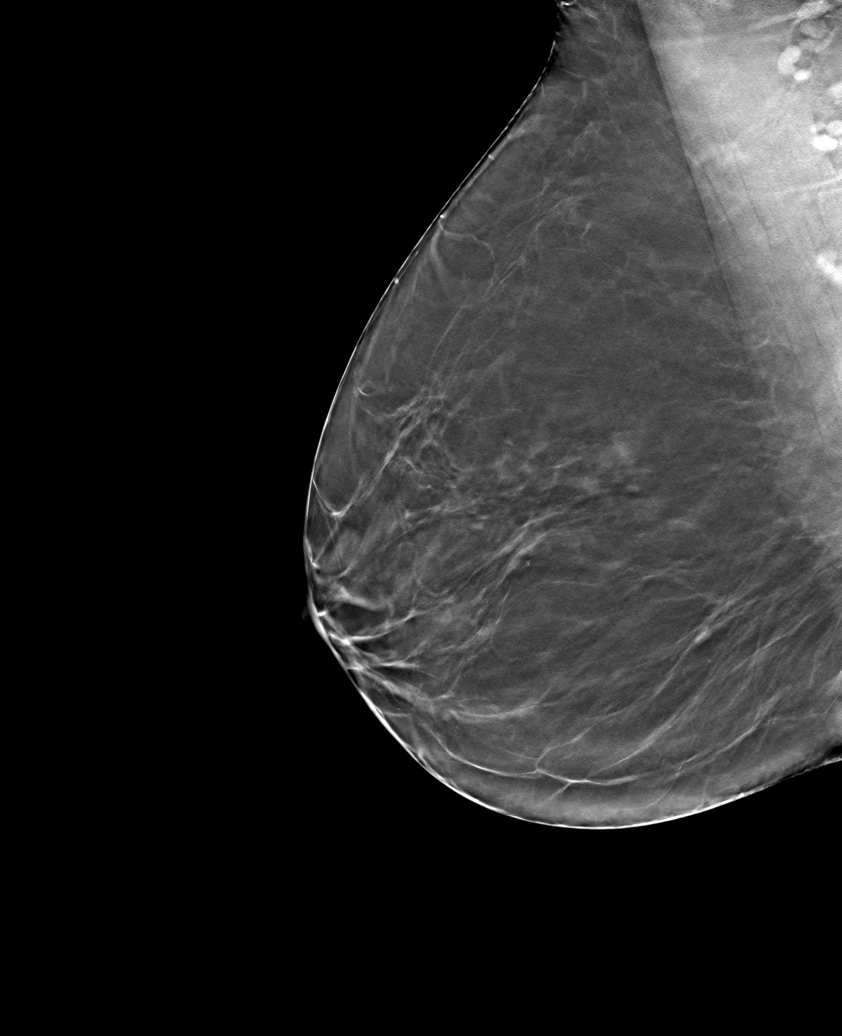

[6 of 14 positions shown; findings below may reference images not displayed]

ACR Breast Density Category b: There are scattered areas of
fibroglandular density.
FINDINGS: Mammographically, there are no suspicious masses, areas of
architectural distortion or microcalcifications in the right breast.
The previously seen gently lobulated low-density mass in the right
breast upper outer quadrant, middle depth, has resolved
mammographically, consistent with benign etiology.

Mammographic images were processed with CAD.

On physical exam, no suspicious masses are found.

Targeted ultrasound is performed, showing no suspicious masses or
shadowing lesions.
IMPRESSION: Resolved probably benign right breast nodule, likely correlating to
a benign cyst.

RECOMMENDATION:
Screening mammogram in one year.(Code:[96])

I have discussed the findings and recommendations with the patient.
Results were also provided in writing at the conclusion of the
visit. If applicable, a reminder letter will be sent to the patient
regarding the next appointment.

BI-RADS CATEGORY  2: Benign.

## 2016-03-18 DIAGNOSIS — G4700 Insomnia, unspecified: Secondary | ICD-10-CM | POA: Diagnosis not present

## 2016-03-18 DIAGNOSIS — F330 Major depressive disorder, recurrent, mild: Secondary | ICD-10-CM | POA: Diagnosis not present

## 2016-03-18 DIAGNOSIS — I10 Essential (primary) hypertension: Secondary | ICD-10-CM | POA: Diagnosis not present

## 2016-03-18 DIAGNOSIS — F411 Generalized anxiety disorder: Secondary | ICD-10-CM | POA: Diagnosis not present

## 2016-04-10 DIAGNOSIS — F330 Major depressive disorder, recurrent, mild: Secondary | ICD-10-CM | POA: Diagnosis not present

## 2016-04-10 DIAGNOSIS — R350 Frequency of micturition: Secondary | ICD-10-CM | POA: Diagnosis not present

## 2016-04-10 DIAGNOSIS — R319 Hematuria, unspecified: Secondary | ICD-10-CM | POA: Diagnosis not present

## 2016-04-10 DIAGNOSIS — Z13220 Encounter for screening for lipoid disorders: Secondary | ICD-10-CM | POA: Diagnosis not present

## 2016-04-10 DIAGNOSIS — Z0000 Encounter for general adult medical examination without abnormal findings: Secondary | ICD-10-CM | POA: Diagnosis not present

## 2016-04-10 DIAGNOSIS — I10 Essential (primary) hypertension: Secondary | ICD-10-CM | POA: Diagnosis not present

## 2016-04-10 DIAGNOSIS — E559 Vitamin D deficiency, unspecified: Secondary | ICD-10-CM | POA: Diagnosis not present

## 2016-04-10 DIAGNOSIS — Z136 Encounter for screening for cardiovascular disorders: Secondary | ICD-10-CM | POA: Diagnosis not present

## 2016-04-10 DIAGNOSIS — Z124 Encounter for screening for malignant neoplasm of cervix: Secondary | ICD-10-CM | POA: Diagnosis not present

## 2016-04-10 DIAGNOSIS — F411 Generalized anxiety disorder: Secondary | ICD-10-CM | POA: Diagnosis not present

## 2016-07-22 ENCOUNTER — Emergency Department (HOSPITAL_COMMUNITY): Payer: Self-pay

## 2016-07-22 ENCOUNTER — Emergency Department (HOSPITAL_COMMUNITY)
Admission: EM | Admit: 2016-07-22 | Discharge: 2016-07-22 | Disposition: A | Discharge status: Home / Self Care | Payer: Self-pay | Attending: Emergency Medicine | Admitting: Emergency Medicine

## 2016-07-22 ENCOUNTER — Encounter (HOSPITAL_COMMUNITY): Payer: Self-pay

## 2016-07-22 DIAGNOSIS — R1012 Left upper quadrant pain: Secondary | ICD-10-CM | POA: Insufficient documentation

## 2016-07-22 DIAGNOSIS — M545 Low back pain, unspecified: Secondary | ICD-10-CM

## 2016-07-22 LAB — URINALYSIS, MICROSCOPIC (REFLEX): BACTERIA UA: NONE SEEN

## 2016-07-22 LAB — URINALYSIS, ROUTINE W REFLEX MICROSCOPIC
Bilirubin Urine: NEGATIVE
Glucose, UA: 100 mg/dL — AB
Ketones, ur: NEGATIVE mg/dL
LEUKOCYTES UA: NEGATIVE
NITRITE: NEGATIVE
Protein, ur: NEGATIVE mg/dL
pH: 5.5 (ref 5.0–8.0)

## 2016-07-22 LAB — PREGNANCY, URINE: PREG TEST UR: NEGATIVE

## 2016-07-22 IMAGING — CT CT ABD-PELV W/O CM
2 of 4 series · 16 of 46 positions shown, 18 images · non-contrast
Comparison: None.

CLINICAL DATA: Left flank pain for 2 days

EXAM:
CT ABDOMEN AND PELVIS WITHOUT CONTRAST
TECHNIQUE: Multidetector CT imaging of the abdomen and pelvis was performed
following the standard protocol without IV contrast.

[Series 3: renal stone 5.0 · axial · 0.79mm/px · z∈[+590,+1000]mm · 13 of 90 slices shown, 15 images]
[im 4/90  soft-tissue]
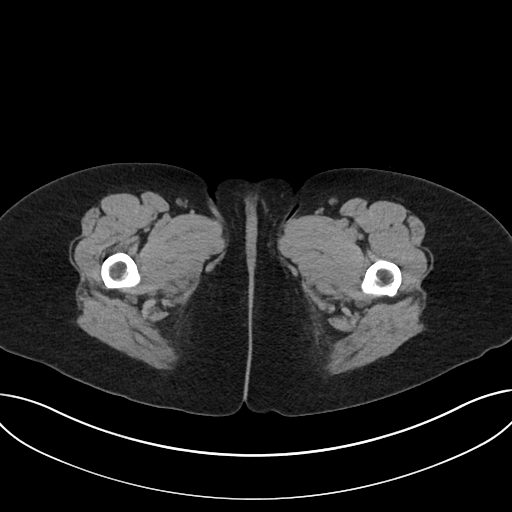
[im 4/90  bone]
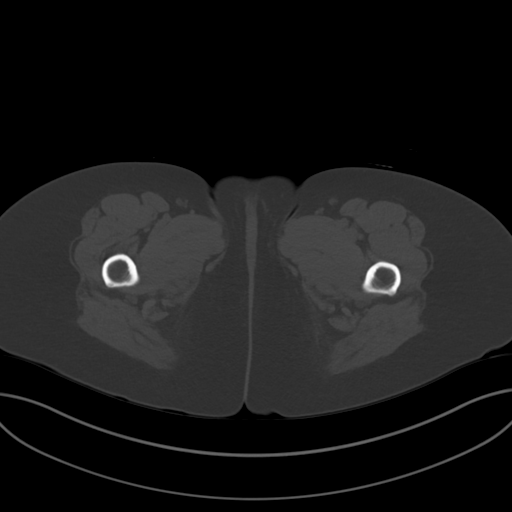
[im 11/90  soft-tissue]
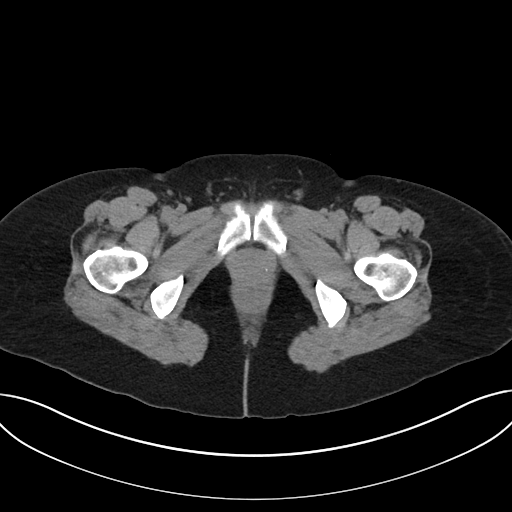
[im 18/90  soft-tissue]
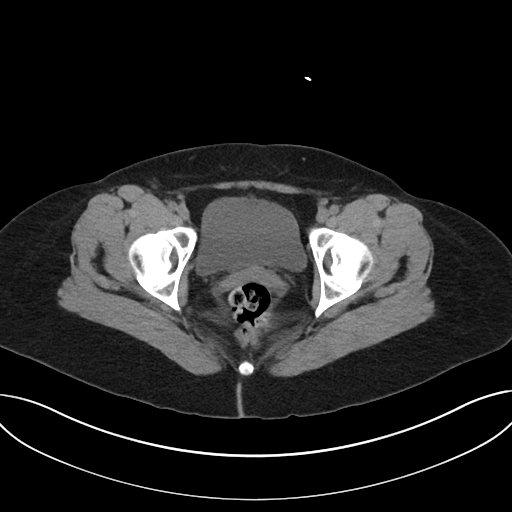
[im 25/90  soft-tissue]
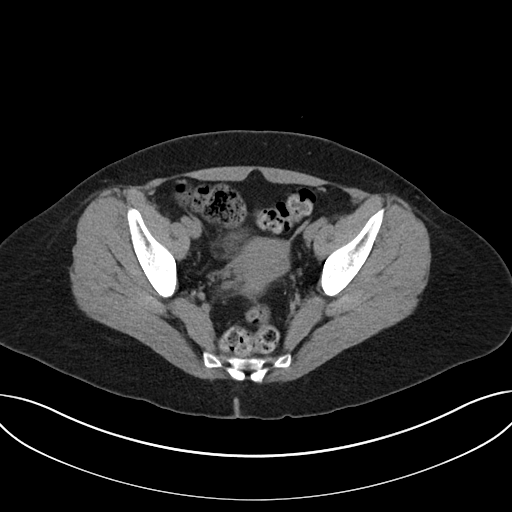
[im 33/90  soft-tissue]
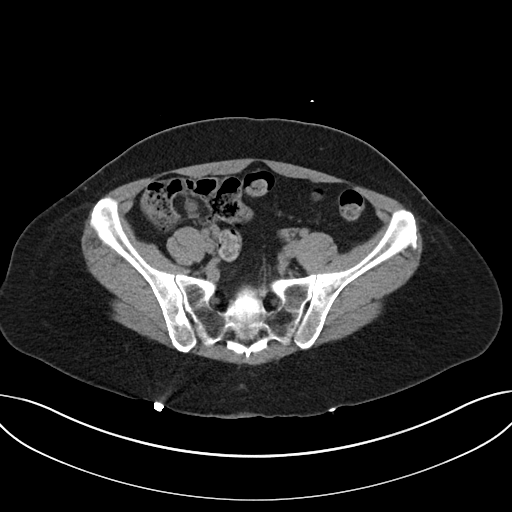
[im 40/90  soft-tissue]
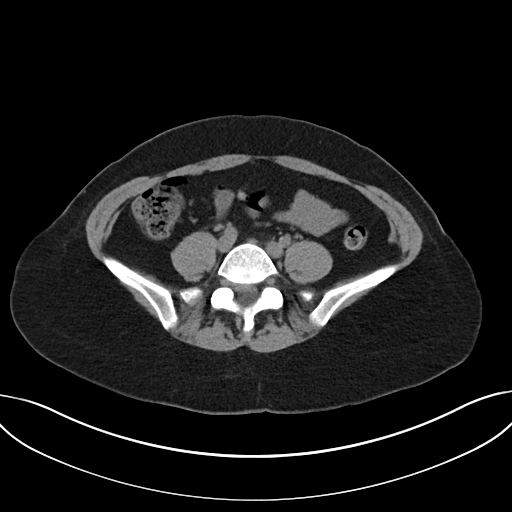
[im 47/90  soft-tissue]
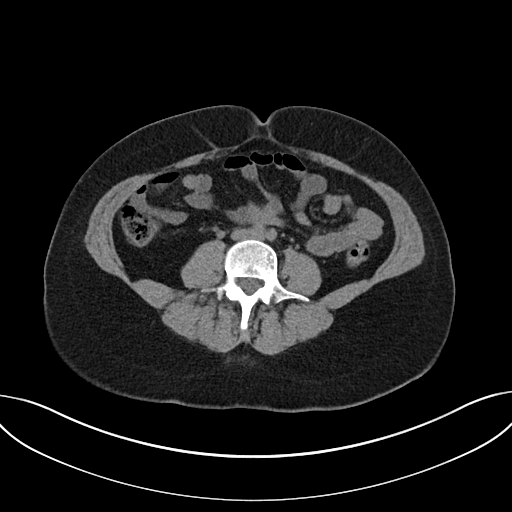
[im 50/90  soft-tissue]
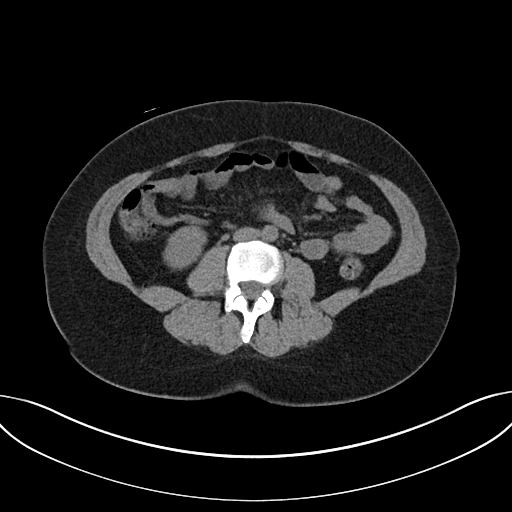
[im 57/90  soft-tissue]
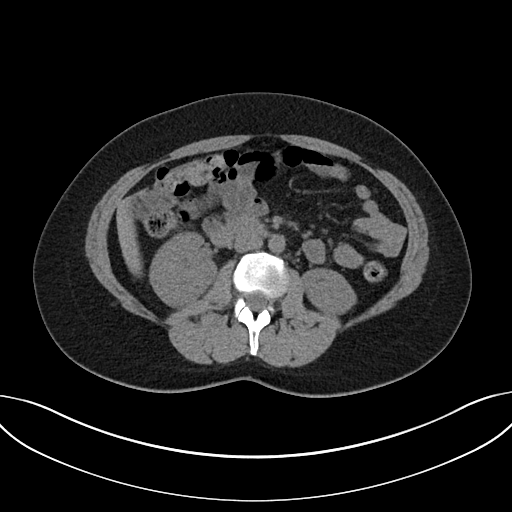
[im 57/90  bone]
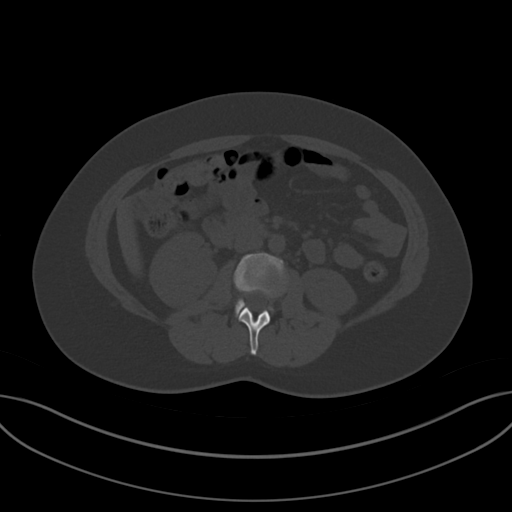
[im 65/90  soft-tissue]
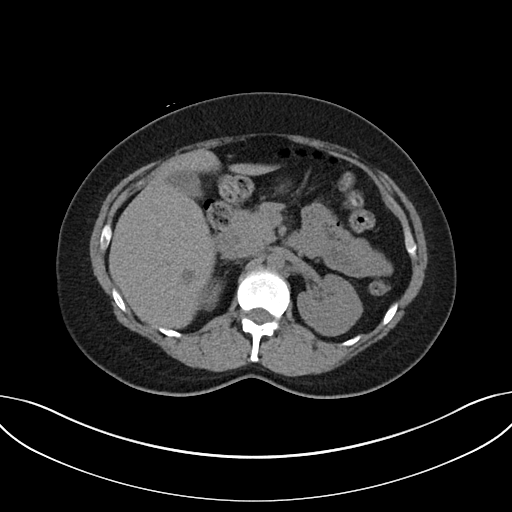
[im 72/90  soft-tissue]
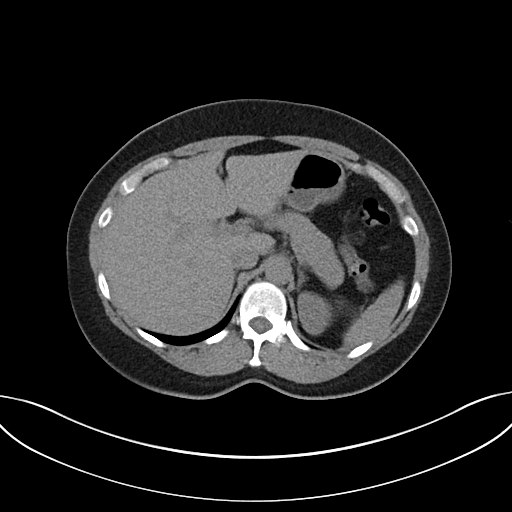
[im 79/90  soft-tissue]
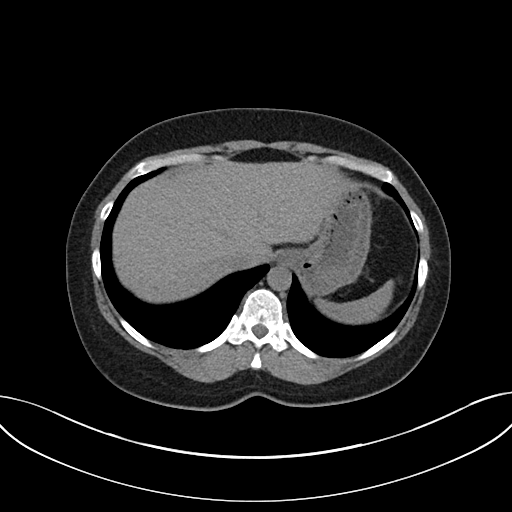
[im 86/90  soft-tissue]
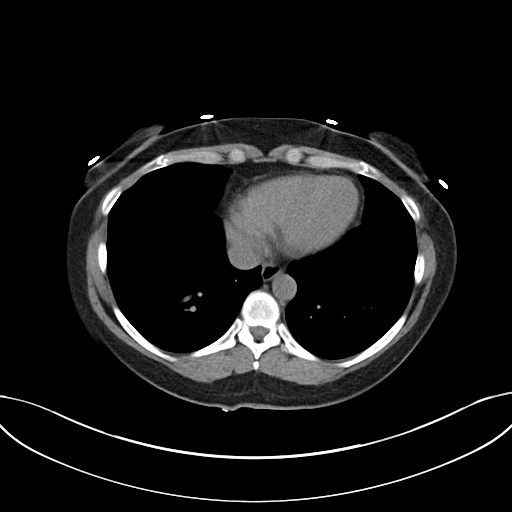

[Series 5: renal stone 3.0 cor · coronal · 0.76mm/px · 3 of 94 slices shown]
[im 32/94  soft-tissue]
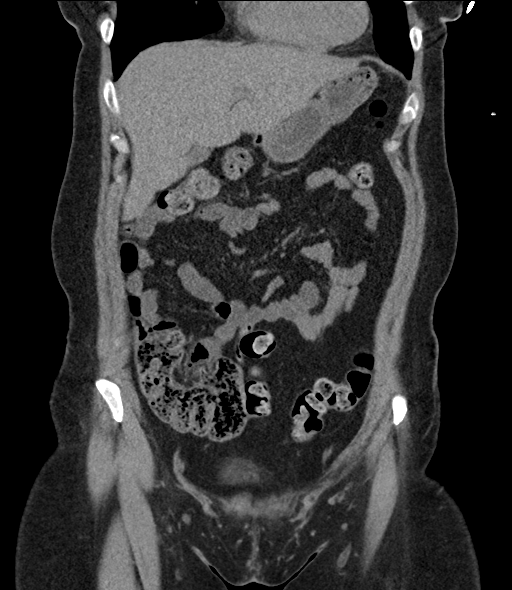
[im 42/94  soft-tissue]
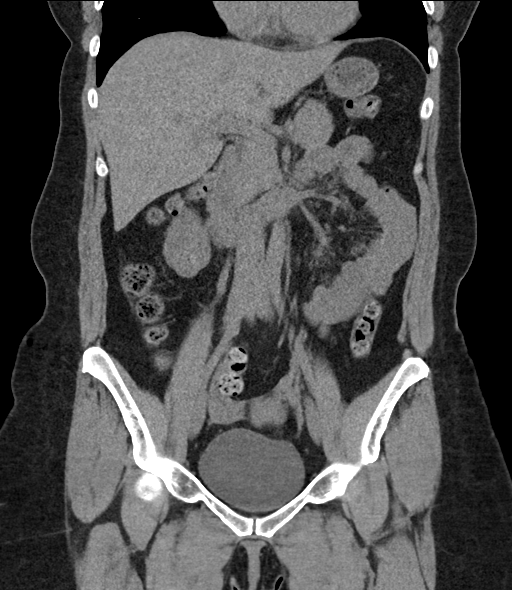
[im 52/94  soft-tissue]
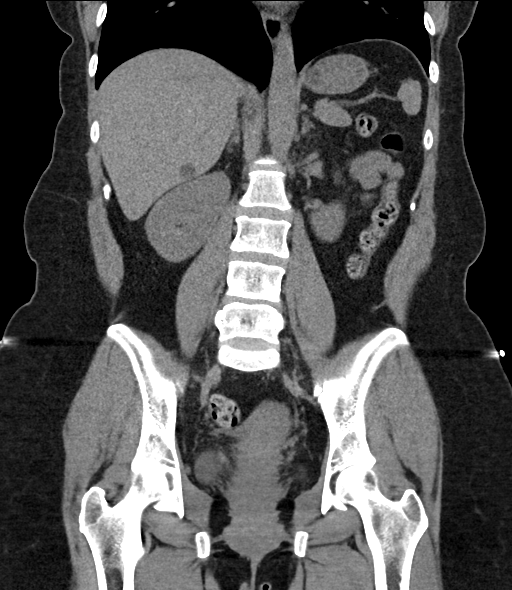

[16 of 46 positions shown; findings below may reference images not displayed]

FINDINGS: Lower chest: No acute abnormality.

Hepatobiliary: There is a 2 cm low-density lesion in the right lobe
liver. This is nonspecific on noncontrast exam. This may represent a
liver cyst. The gallbladder is normal. The biliary tree is normal.

Pancreas: Unremarkable. No pancreatic ductal dilatation or
surrounding inflammatory changes.

Spleen: Normal in size without focal abnormality.

Adrenals/Urinary Tract: Adrenal glands are unremarkable. Kidneys are
normal, without renal calculi, focal lesion, or hydronephrosis.
Bladder is unremarkable.

Stomach/Bowel: There is a small hiatal hernia. Stomach is otherwise
within normal limits. Appendix appears normal. No evidence of bowel
wall thickening, distention, or inflammatory changes.

Vascular/Lymphatic: No significant vascular findings are present. No
enlarged abdominal or pelvic lymph nodes.

Reproductive: Uterus and bilateral adnexa are unremarkable.

Other: There is small umbilical herniation of mesenteric fat. No
abdominal ascites is identified.

Musculoskeletal: No acute or significant osseous findings.
IMPRESSION: No acute abnormality identified in the abdomen and pelvis.No
nephrolithiasis or hydroureteronephrosis is identified bilaterally.

## 2016-07-22 MED ORDER — KETOROLAC TROMETHAMINE 30 MG/ML IJ SOLN
30.0000 mg | Freq: Once | INTRAMUSCULAR | Status: AC
  Administered 2016-07-22: 30 mg via INTRAMUSCULAR

## 2016-07-22 MED ORDER — IBUPROFEN 800 MG PO TABS: 800.0000 mg | ORAL_TABLET | Freq: Three times a day (TID) | ORAL | 0 refills | Status: DC

## 2016-07-22 MED ORDER — KETOROLAC TROMETHAMINE 30 MG/ML IJ SOLN
30.0000 mg | Freq: Once | INTRAMUSCULAR | Status: DC
  Filled 2016-07-22: qty 1

## 2016-07-22 MED ORDER — METHOCARBAMOL 500 MG PO TABS
500.0000 mg | ORAL_TABLET | Freq: Two times a day (BID) | ORAL | 0 refills | Status: DC
Start: 2016-07-22 — End: 2017-06-03

## 2016-07-22 NOTE — Discharge Instructions (Signed)
Take ibuprofen and muscle relaxants as needed for pain. You also use hot packs as needed for symptom control. You may try the back exercises described in this packet for further symptom control. You will continue to have some stiffness and soreness for the next several days, however the pain should not worsen. Return to the emergency department if you develop worsening pain, fever, chills, numbness, tingling, or any new or worsening symptoms.

## 2016-07-22 NOTE — ED Triage Notes (Signed)
Per Pt, Pt woke up this morning with left upper back pain. Pt reports worsens with movement and bending over. Denies SOB. Denies any other symptoms.

## 2016-07-22 NOTE — ED Notes (Signed)
Pt ambulated to restroom. 

## 2016-07-22 NOTE — ED Provider Notes (Signed)
[facility]-EMERGENCY DEPT Provider Note   CSN: [id number] Arrival date & time: 07/22/16  1055     History   Chief Complaint Chief Complaint  Patient presents with  . Back Pain    HPI Monica Reed is a 46 y.o. female presenting with three-day history of right-sided flank pain.  Patient states that when she woke up a few days ago she felt some mild left-sided flank/back pain. She assumed it was just muscular pain, however this morning she woke with worse pain, to the point where she had difficulty getting out of bed. Pain is constant, but worse with certain movements and palpation. She denies fever, chills, chest pain, shortness of breath, abdominal pain, nausea, vomiting, or urinary symptoms. She has had normal bowel movements. She denies numbness, tingling, or weakness. She denies traumas, falls or injuries.  HPI  Past Medical History:  Diagnosis Date  . Frequent UTI     There are no active problems to display for this patient.   History reviewed. No pertinent surgical history.  OB History    No data available       Home Medications    Prior to Admission medications   Medication Sig Start Date End Date Taking? Authorizing Provider  cephALEXin (KEFLEX) 500 MG capsule Take 1 capsule (500 mg total) by mouth 4 (four) times daily. 03/21/12   Hayden Rasmussen, NP  ibuprofen (ADVIL,MOTRIN) 800 MG tablet Take 1 tablet (800 mg total) by mouth 3 (three) times daily. 07/22/16   Leiland Mihelich, PA-C  methocarbamol (ROBAXIN) 500 MG tablet Take 1 tablet (500 mg total) by mouth 2 (two) times daily. 07/22/16   Calene Paradiso, PA-C  UNKNOWN TO PATIENT BCPs    [provider]    Family History No family history on file.  Social History Social History  Substance Use Topics  . Smoking status: Never Smoker  . Smokeless tobacco: Never Used  . Alcohol use No     Allergies   Cyclobenzaprine   Review of Systems Review of Systems  All other systems reviewed and are  negative.    Physical Exam Updated Vital Signs BP 138/77 (BP Location: Right Arm)   Pulse 65   Temp 98.6 F (37 C) (Oral)   Resp 12   Ht 5' (1.524 m)   Wt 68 kg (150 lb)   SpO2 99%   BMI 29.29 kg/m   Physical Exam  Constitutional: She is oriented to person, place, and time. She appears well-developed and well-nourished. No distress.  HENT:  Head: Normocephalic and atraumatic.  Eyes: Pupils are equal, round, and reactive to light.  Neck: Normal range of motion.  Cardiovascular: Normal rate and regular rhythm.   Pulmonary/Chest: Effort normal and breath sounds normal.  Abdominal: Soft. She exhibits no distension. There is no tenderness.  Musculoskeletal: She exhibits tenderness.  Tenderness to palpation of the left flank. No tenderness to palpation of the neck, spine, or spinous processes. No tenderness on the right side. Decreased ROM of the back due to pain, especially bending forward and twisting.   Neurological: She is alert and oriented to person, place, and time. She has normal strength. No cranial nerve deficit or sensory deficit. She displays a negative Romberg sign. GCS eye subscore is 4. GCS verbal subscore is 5. GCS motor subscore is 6.  Skin: Skin is warm and dry. She is not diaphoretic.  Psychiatric: She has a normal mood and affect.     ED Treatments / Results  Labs (all labs  ordered are listed, but only abnormal results are displayed) Labs Reviewed  URINALYSIS, ROUTINE W REFLEX MICROSCOPIC - Abnormal; Notable for the following:       Result Value   Specific Gravity, Urine <1.005 (*)    Glucose, UA 100 (*)    Hgb urine dipstick TRACE (*)    All other components within normal limits  URINALYSIS, MICROSCOPIC (REFLEX) - Abnormal; Notable for the following:    Squamous Epithelial / LPF 0-5 (*)    All other components within normal limits  PREGNANCY, URINE    EKG  EKG Interpretation None       Radiology Ct Abdomen Pelvis Wo Contrast  Result Date:  07/22/2016 CLINICAL DATA:  Left flank pain for 2 days EXAM: CT ABDOMEN AND PELVIS WITHOUT CONTRAST TECHNIQUE: Multidetector CT imaging of the abdomen and pelvis was performed following the standard protocol without IV contrast. COMPARISON:  None. FINDINGS: Lower chest: No acute abnormality. Hepatobiliary: There is a 2 cm low-density lesion in the right lobe liver. This is nonspecific on noncontrast exam. This may represent a liver cyst. The gallbladder is normal. The biliary tree is normal. Pancreas: Unremarkable. No pancreatic ductal dilatation or surrounding inflammatory changes. Spleen: Normal in size without focal abnormality. Adrenals/Urinary Tract: Adrenal glands are unremarkable. Kidneys are normal, without renal calculi, focal lesion, or hydronephrosis. Bladder is unremarkable. Stomach/Bowel: There is a small hiatal hernia. Stomach is otherwise within normal limits. Appendix appears normal. No evidence of bowel wall thickening, distention, or inflammatory changes. Vascular/Lymphatic: No significant vascular findings are present. No enlarged abdominal or pelvic lymph nodes. Reproductive: Uterus and bilateral adnexa are unremarkable. Other: There is small umbilical herniation of mesenteric fat. No abdominal ascites is identified. Musculoskeletal: No acute or significant osseous findings. IMPRESSION: No acute abnormality identified in the abdomen and pelvis.No nephrolithiasis or hydroureteronephrosis is identified bilaterally. Electronically Signed   By: Sherian Rein M.D.   On: 07/22/2016 17:44    Procedures Procedures (including critical care time)  Medications Ordered in ED Medications  ketorolac (TORADOL) 30 MG/ML injection 30 mg (30 mg Intramuscular Given 07/22/16 1635)     Initial Impression / Assessment and Plan / ED Course  I have reviewed the triage vital signs and the nursing notes.  Pertinent labs & imaging results that were available during my care of the patient were reviewed by me and  considered in my medical decision making (see chart for details).     Pt with L sided flank pain x 3 days. Pain is worse with movements and palpation. No TTP of spine or spinous process. No other sxs indicating infection. Pt with h/o frequent UTIs. Concern for possible kidney etiology vs MSK. Will order UA to r/o infection and to see if there is blood in the urine. Will tx pain with ketorolac IM.   UA shows trace blood and no signs of infection. Will order CT to r/o kidney stone.   CT negative for kidney stone or other pathology, kidney appears healthy. Discussed findings with pt. Will dc pt with rx for NSAIDs and muscle relaxant for sx control. Return precautions given. Pt states she understands and agrees to plan.  Final Clinical Impressions(s) / ED Diagnoses   Final diagnoses:  Acute left-sided low back pain without sciatica    New Prescriptions Discharge Medication List as of 07/22/2016  6:01 PM    START taking these medications   Details  ibuprofen (ADVIL,MOTRIN) 800 MG tablet Take 1 tablet (800 mg total) by mouth 3 (three) times daily.,  Starting Mon 07/22/2016, Print    methocarbamol (ROBAXIN) 500 MG tablet Take 1 tablet (500 mg total) by mouth 2 (two) times daily., Starting Mon 07/22/2016, Print         Goliadaccavale, HomedaleSophia, PA-C 07/23/16 Addison [id number]    Knapp, Jon, MD 07/24/16 [phone]

## 2017-04-24 DIAGNOSIS — F330 Major depressive disorder, recurrent, mild: Secondary | ICD-10-CM | POA: Insufficient documentation

## 2017-04-24 DIAGNOSIS — E559 Vitamin D deficiency, unspecified: Secondary | ICD-10-CM | POA: Diagnosis not present

## 2017-04-24 DIAGNOSIS — R5383 Other fatigue: Secondary | ICD-10-CM | POA: Diagnosis not present

## 2017-04-24 DIAGNOSIS — I10 Essential (primary) hypertension: Secondary | ICD-10-CM | POA: Diagnosis not present

## 2017-04-24 DIAGNOSIS — F419 Anxiety disorder, unspecified: Secondary | ICD-10-CM | POA: Diagnosis not present

## 2017-05-29 DIAGNOSIS — R319 Hematuria, unspecified: Secondary | ICD-10-CM | POA: Insufficient documentation

## 2017-05-29 DIAGNOSIS — G43909 Migraine, unspecified, not intractable, without status migrainosus: Secondary | ICD-10-CM | POA: Insufficient documentation

## 2017-06-03 ENCOUNTER — Encounter: Payer: Self-pay | Admitting: Adult Health

## 2017-06-03 ENCOUNTER — Ambulatory Visit (INDEPENDENT_AMBULATORY_CARE_PROVIDER_SITE_OTHER): Payer: BLUE CROSS/BLUE SHIELD | Admitting: Adult Health

## 2017-06-03 VITALS — BP 113/69 | HR 74 | Ht 60.0 in | Wt 154.6 lb

## 2017-06-03 DIAGNOSIS — F419 Anxiety disorder, unspecified: Secondary | ICD-10-CM

## 2017-06-03 DIAGNOSIS — I10 Essential (primary) hypertension: Secondary | ICD-10-CM | POA: Diagnosis not present

## 2017-06-03 DIAGNOSIS — Z6830 Body mass index (BMI) 30.0-30.9, adult: Secondary | ICD-10-CM

## 2017-06-03 DIAGNOSIS — Z0000 Encounter for general adult medical examination without abnormal findings: Secondary | ICD-10-CM

## 2017-06-03 NOTE — Assessment & Plan Note (Signed)
TLC first and if some wt loss achieved at f/u will start on Phentermine

## 2017-06-03 NOTE — Assessment & Plan Note (Signed)
BP at goal 113/69 HR 74 Continue on Prinzide 20/12.5mg  QD

## 2017-06-03 NOTE — Progress Notes (Signed)
Subjective:    Patient ID: Monica Reed, female    DOB: 03/30/1970, 47 y.o.   MRN: [id number]  HPI:  Monica Reed is here to establish as new pt.  She is a pleasant 47 year old female.  PMH: HTN, HLD, migraine, anxiety/depression, and obesity. She estimates to drink 16-20 oz water/day and prefers to drink cool-aid and soda. 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 When she is working (6-10 hr shifts) she is constantly bending/pushing/pulling/lifting. She denies regular exercise outside of work. She lives with her husband, three daughters, and one granddaughter She is interested in medical wt and states "I am really ready to make a permanent change". She reports anxiety/depression is well managed on Fluoxetine  QD, and she reports strong support system of family/friends. She denies tobacco/ETOH use  Patient Care Team    Relationship Specialty Notifications Start End  Julaine Fusi, NP PCP - General Family Medicine  06/03/17     Patient Active Problem List   Diagnosis Date Noted  . BMI 30.0-30.9,adult 06/03/2017  . Healthcare maintenance 06/03/2017  . Blood in urine 05/29/2017  . Migraine 05/29/2017  . Anxiety 12/23/2014  . Hypertension 03/26/2013     Past Medical History:  Diagnosis Date  . Frequent UTI   . Hypertension      History reviewed. No pertinent surgical history.   Family History  Problem Relation Age of Onset  . Arthritis Mother   . Cancer Father        prostate  . Heart attack Father   . Healthy Sister   . Healthy Brother   . Hypertension Daughter   . Healthy Brother   . Healthy Daughter   . Healthy Daughter      Social History   Substance and Sexual Activity  Drug Use No     Social History   Substance and Sexual Activity  Alcohol Use No     Social History   Tobacco Use  Smoking Status Never Smoker  Smokeless Tobacco Never Used     Outpatient  Encounter Medications as of 06/03/2017  Medication Sig  . FLUoxetine (PROZAC) 40 MG capsule Take 1 capsule by mouth daily.  Marland Kitchen ibuprofen (ADVIL,MOTRIN) 800 MG tablet Take 1 tablet (800 mg total) by mouth 3 (three) times daily.  Marland Kitchen lisinopril-hydrochlorothiazide (PRINZIDE,ZESTORETIC) 20-12.5 MG tablet Take 1 tablet by mouth daily.  Marland Kitchen loratadine (CLARITIN) 10 MG tablet Take 10 mg by mouth daily.  . norethindrone-ethinyl estradiol (JUNEL FE 1/20) 1-20 MG-MCG tablet Take 1 tablet by mouth daily.  . [DISCONTINUED] cephALEXin (KEFLEX) 500 MG capsule Take 1 capsule (500 mg total) by mouth 4 (four) times daily.  . [DISCONTINUED] methocarbamol (ROBAXIN) 500 MG tablet Take 1 tablet (500 mg total) by mouth 2 (two) times daily.  . [DISCONTINUED] UNKNOWN TO PATIENT BCPs   No facility-administered encounter medications on file as of 06/03/2017.     Allergies: Cyclobenzaprine  Body mass index is 30.19 kg/m.  Blood pressure 113/69, pulse 74, height 5' (1.524 m), weight 154 lb 9.6 oz (70.1 kg), SpO2 99 %.     Review of Systems  Constitutional: Positive for fatigue. Negative for activity change, appetite change, chills, diaphoresis, fever and unexpected weight change.  HENT: Negative for congestion.   Eyes: Negative for visual disturbance.  Respiratory: Negative for cough, chest tightness, shortness of breath, wheezing and stridor.   Cardiovascular: Negative for chest pain, palpitations and leg  swelling.  Gastrointestinal: Negative for abdominal distention, abdominal pain, blood in stool, constipation, diarrhea, nausea and vomiting.  Genitourinary: Negative for difficulty urinating and flank pain.  Musculoskeletal: Negative for arthralgias, back pain, gait problem, joint swelling, myalgias, neck pain and neck stiffness.  Neurological: Positive for headaches. Negative for dizziness.  Hematological: Does not bruise/bleed easily.  Psychiatric/Behavioral: Positive for dysphoric mood. Negative for  confusion, decreased concentration, hallucinations, self-injury, sleep disturbance and suicidal ideas. The patient is nervous/anxious. The patient is not hyperactive.        Objective:   Physical Exam  Constitutional: She is oriented to person, place, and time. She appears well-developed and well-nourished. No distress.  HENT:  Head: Normocephalic and atraumatic.  Right Ear: External ear normal.  Left Ear: External ear normal.  Eyes: Pupils are equal, round, and reactive to light. Conjunctivae and EOM are normal.  Cardiovascular: Normal rate, regular rhythm, normal heart sounds and intact distal pulses.  No murmur heard. Pulmonary/Chest: Effort normal and breath sounds normal. No stridor. No respiratory distress. She has no wheezes. She has no rales. She exhibits no tenderness.  Neurological: She is alert and oriented to person, place, and time.  Skin: Skin is warm and dry. Capillary refill takes less than 2 seconds. No rash noted. She is not diaphoretic. No erythema. No pallor.  Psychiatric: She has a normal mood and affect. Her behavior is normal. Judgment and thought content normal.          Assessment & Plan:   1. Healthcare maintenance   2. Anxiety   3. BMI 30.0-30.9,adult   4. Essential hypertension     Healthcare maintenance Please continue all medications as directed. Increase water intake, strive for at least 80 ounces/day.   Follow Heart Healthy diet Increase regular exercise.  Recommend at least 30 minutes daily, 5 days per week of walking, jogging, biking, swimming, YouTube/Pinterest workout videos. Follow-up in 4 weeks, re: medical weight loss. If your lifestyle changes have been successful in some weight loss, we will start you on a medication called Phentermine.   BMI 30.0-30.9,adult TLC first and if some wt loss achieved at f/u will start on Phentermine  Hypertension BP at goal 113/69 HR 74 Continue on Prinzide 20/12.5mg  QD    FOLLOW-UP:  Return in  about 1 month (around 07/01/2017) for Regular Follow Up, Medical Weight Loss.

## 2017-06-03 NOTE — Assessment & Plan Note (Signed)
Please continue all medications as directed. Increase water intake, strive for at least 80 ounces/day.   Follow Heart Healthy diet Increase regular exercise.  Recommend at least 30 minutes daily, 5 days per week of walking, jogging, biking, swimming, YouTube/Pinterest workout videos. Follow-up in 4 weeks, re: medical weight loss. If your lifestyle changes have been successful in some weight loss, we will start you on a medication called Phentermine.

## 2017-06-03 NOTE — Patient Instructions (Signed)
Mediterranean Diet A Mediterranean diet refers to food and lifestyle choices that are based on the traditions of countries located on the [place]. This way of eating has been shown to help prevent certain conditions and improve outcomes for people who have chronic diseases, like kidney disease and heart disease. What are tips for following this plan? Lifestyle  Cook and eat meals together with your family, when possible.  Drink enough fluid to keep your urine clear or pale yellow.  Be physically active every day. This includes: ? Aerobic exercise like running or swimming. ? Leisure activities like gardening, walking, or housework.  Get 7-8 hours of sleep each night.  If recommended by your health care provider, drink red wine in moderation. This means 1 glass a day for nonpregnant women and 2 glasses a day for men. A glass of wine equals 5 oz (150 mL). Reading food labels  Check the serving size of packaged foods. For foods such as rice and pasta, the serving size refers to the amount of cooked product, not dry.  Check the total fat in packaged foods. Avoid foods that have saturated fat or trans fats.  Check the ingredients list for added sugars, such as corn syrup. Shopping  At the grocery store, buy most of your food from the areas near the walls of the store. This includes: ? Fresh fruits and vegetables (produce). ? Grains, beans, nuts, and seeds. Some of these may be available in unpackaged forms or large amounts (in bulk). ? Fresh seafood. ? Poultry and eggs. ? Low-fat dairy products.  Buy whole ingredients instead of prepackaged foods.  Buy fresh fruits and vegetables in-season from local farmers markets.  Buy frozen fruits and vegetables in resealable bags.  If you do not have access to quality fresh seafood, buy precooked frozen shrimp or canned fish, such as tuna, salmon, or sardines.  Buy small amounts of raw or cooked vegetables, salads, or olives from the  deli or salad bar at your store.  Stock your pantry so you always have certain foods on hand, such as olive oil, canned tuna, canned tomatoes, rice, pasta, and beans. Cooking  Cook foods with extra-virgin olive oil instead of using butter or other vegetable oils.  Have meat as a side dish, and have vegetables or grains as your main dish. This means having meat in small portions or adding small amounts of meat to foods like pasta or stew.  Use beans or vegetables instead of meat in common dishes like chili or lasagna.  Experiment with different cooking methods. Try roasting or broiling vegetables instead of steaming or sauteing them.  Add frozen vegetables to soups, stews, pasta, or rice.  Add nuts or seeds for added healthy fat at each meal. You can add these to yogurt, salads, or vegetable dishes.  Marinate fish or vegetables using olive oil, lemon juice, garlic, and fresh herbs. Meal planning  Plan to eat 1 vegetarian meal one day each week. Try to work up to 2 vegetarian meals, if possible.  Eat seafood 2 or more times a week.  Have healthy snacks readily available, such as: ? Vegetable sticks with hummus. ? Greek yogurt. ? Fruit and nut trail mix.  Eat balanced meals throughout the week. This includes: ? Fruit: 2-3 servings a day ? Vegetables: 4-5 servings a day ? Low-fat dairy: 2 servings a day ? Fish, poultry, or lean meat: 1 serving a day ? Beans and legumes: 2 or more servings a week ? Nuts   and seeds: 1-2 servings a day ? Whole grains: 6-8 servings a day ? Extra-virgin olive oil: 3-4 servings a day  Limit red meat and sweets to only a few servings a month What are my food choices?  Mediterranean diet ? Recommended ? Grains: Whole-grain pasta. Brown rice. Bulgar wheat. Polenta. Couscous. Whole-wheat bread. [place]. ? Vegetables: Artichokes. Beets. Broccoli. Cabbage. Carrots. Eggplant. Green beans. Chard. Kale. Spinach. Onions. Leeks. Peas. Squash.  Tomatoes. Peppers. Radishes. ? Fruits: Apples. Apricots. Avocado. Berries. Bananas. Cherries. Dates. Figs. Grapes. Lemons. Melon. Oranges. Peaches. Plums. Pomegranate. ? Meats and other protein foods: Beans. Almonds. Sunflower seeds. Pine nuts. Peanuts. [place]. Salmon. Scallops. Shrimp. [place]. Tilapia. Clams. Oysters. Eggs. ? Dairy: Low-fat milk. Cheese. Greek yogurt. ? Beverages: Water. Red wine. Herbal tea. ? Fats and oils: Extra virgin olive oil. Avocado oil. Grape seed oil. ? Sweets and desserts: [place] yogurt with honey. Baked apples. Poached pears. Trail mix. ? Seasoning and other foods: Basil. Cilantro. Coriander. Cumin. Mint. Parsley. Sage. Rosemary. Tarragon. Garlic. Oregano. Thyme. Pepper. Balsalmic vinegar. Tahini. Hummus. Tomato sauce. Olives. Mushrooms. ? Limit these ? Grains: Prepackaged pasta or rice dishes. Prepackaged cereal with added sugar. ? Vegetables: Deep fried potatoes (french fries). ? Fruits: Fruit canned in syrup. ? Meats and other protein foods: Beef. Pork. Lamb. Poultry with skin. Hot dogs. Berniece Salines. ? Dairy: Ice cream. Sour cream. Whole milk. ? Beverages: Juice. Sugar-sweetened soft drinks. Beer. Liquor and spirits. ? Fats and oils: Butter. Canola oil. Vegetable oil. Beef fat (tallow). Lard. ? Sweets and desserts: Cookies. Cakes. Pies. Candy. ? Seasoning and other foods: Mayonnaise. Premade sauces and marinades. ? The items listed may not be a complete list. Talk with your dietitian about what dietary choices are right for you. Summary  The Mediterranean diet includes both food and lifestyle choices.  Eat a variety of fresh fruits and vegetables, beans, nuts, seeds, and whole grains.  Limit the amount of red meat and sweets that you eat.  Talk with your health care provider about whether it is safe for you to drink red wine in moderation. This means 1 glass a day for nonpregnant women and 2 glasses a day for men. A glass of wine equals 5 oz (150 mL). This information  is not intended to replace advice given to you by your health care provider. Make sure you discuss any questions you have with your health care provider. Document Released: 08/31/2015 Document Revised: 10/03/2015 Document Reviewed: 08/31/2015 Elsevier Interactive Patient Education  2018 [employer].    Exercising to [employer] Exercising can help you to lose weight. In order to lose weight through exercise, you need to do vigorous-intensity exercise. You can tell that you are exercising with vigorous intensity if you are breathing very hard and fast and cannot hold a conversation while exercising. Moderate-intensity exercise helps to maintain your current weight. You can tell that you are exercising at a moderate level if you have a higher heart rate and faster breathing, but you are still able to hold a conversation. How often should I exercise? Choose an activity that you enjoy and set realistic goals. Your health care provider can help you to make an activity plan that works for you. Exercise regularly as directed by your health care provider. This may include:  Doing resistance training twice each week, such as: ? Push-ups. ? Sit-ups. ? Lifting weights. ? Using resistance bands.  Doing a given intensity of exercise for a given amount of time. Choose from these options: ?  150 minutes of moderate-intensity exercise every week. ? 75 minutes of vigorous-intensity exercise every week. ? A mix of moderate-intensity and vigorous-intensity exercise every week.  Children, pregnant women, people who are out of shape, people who are overweight, and older adults may need to consult a health care provider for individual recommendations. If you have any sort of medical condition, be sure to consult your health care provider before starting a new exercise program. What are some activities that can help me to lose weight?  Walking at a rate of at least 4.5 miles an hour.  Jogging or running at a  rate of 5 miles per hour.  Biking at a rate of at least 10 miles per hour.  Lap swimming.  Roller-skating or in-line skating.  Cross-country skiing.  Vigorous competitive sports, such as football, basketball, and soccer.  Jumping rope.  Aerobic dancing. How can I be more active in my day-to-day activities?  Use the stairs instead of the elevator.  Take a walk during your lunch break.  If you drive, park your car farther away from work or school.  If you take public transportation, get off one stop early and walk the rest of the way.  Make all of your phone calls while standing up and walking around.  Get up, stretch, and walk around every 30 minutes throughout the day. What guidelines should I follow while exercising?  Do not exercise so much that you hurt yourself, feel dizzy, or get very short of breath.  Consult your health care provider prior to starting a new exercise program.  Wear comfortable clothes and shoes with good support.  Drink plenty of water while you exercise to prevent dehydration or heat stroke. Body water is lost during exercise and must be replaced.  Work out until you breathe faster and your heart beats faster. This information is not intended to replace advice given to you by your health care provider. Make sure you discuss any questions you have with your health care provider. Document Released: 02/09/2010 Document Revised: 06/15/2015 Document Reviewed: 06/10/2013 Elsevier Interactive Patient Education  [employer].  Please continue all medications as directed. Increase water intake, strive for at least 80 ounces/day.   Follow Heart Healthy diet Increase regular exercise.  Recommend at least 30 minutes daily, 5 days per week of walking, jogging, biking, swimming, YouTube/Pinterest workout videos. Follow-up in 4 weeks, re: medical weight loss. If your lifestyle changes have been successful in some weight loss, we will start you on a  medication called Phentermine.  WELCOME TO THE PRACTICE!

## 2017-09-02 ENCOUNTER — Encounter: Payer: Self-pay | Admitting: Adult Health

## 2017-09-02 ENCOUNTER — Ambulatory Visit (INDEPENDENT_AMBULATORY_CARE_PROVIDER_SITE_OTHER): Payer: BLUE CROSS/BLUE SHIELD | Admitting: Adult Health

## 2017-09-02 VITALS — BP 101/67 | HR 68 | Ht 60.0 in | Wt 150.7 lb

## 2017-09-02 DIAGNOSIS — Z6827 Body mass index (BMI) 27.0-27.9, adult: Secondary | ICD-10-CM

## 2017-09-02 DIAGNOSIS — E663 Overweight: Secondary | ICD-10-CM | POA: Insufficient documentation

## 2017-09-02 DIAGNOSIS — Z6829 Body mass index (BMI) 29.0-29.9, adult: Secondary | ICD-10-CM

## 2017-09-02 DIAGNOSIS — I10 Essential (primary) hypertension: Secondary | ICD-10-CM | POA: Diagnosis not present

## 2017-09-02 DIAGNOSIS — Z6826 Body mass index (BMI) 26.0-26.9, adult: Secondary | ICD-10-CM | POA: Insufficient documentation

## 2017-09-02 MED ORDER — PHENTERMINE HCL 37.5 MG PO TABS: 37.5000 mg | ORAL_TABLET | Freq: Every day | ORAL | 0 refills | Status: DC

## 2017-09-02 NOTE — Assessment & Plan Note (Signed)
BP at goal 101/67, HR 68 Continue Lisinopril/HCTZ 20/12.5mg  QD

## 2017-09-02 NOTE — Assessment & Plan Note (Signed)
>>  ASSESSMENT AND PLAN FOR BMI 26.0-26.9,ADULT WRITTEN ON 09/02/2017  3:00 PM BY DANFORD, KATY D, NP  She has lost 4 lbs since last OV 06/03/17 [facility] Controlled Substance Database reviewed- no aberrancies noted. Please start phentermine 37.5mg  once daily. Continue to drink plenty of water and follow Mediterranean diet. Increase regular exercise.  Recommend at least 30 minutes daily, 5 days per week of walking, jogging, biking, swimming, YouTube/Pinterest workout videos. Follow-up in 3 weeks

## 2017-09-02 NOTE — Assessment & Plan Note (Signed)
She has lost 4 lbs since last OV 06/03/17 [facility]- no aberrancies noted. Please start phentermine 37.5mg  once daily. Continue to drink plenty of water and follow Mediterranean diet. Increase regular exercise.  Recommend at least 30 minutes daily, 5 days per week of walking, jogging, biking, swimming, YouTube/Pinterest workout videos. Follow-up in 3 weeks

## 2017-09-02 NOTE — Progress Notes (Signed)
Subjective:    Patient ID: Luiz IronMary E Frye, female    DOB: Dec 28, 1970, 47 y.o.   MRN: [id number]  HPI:  Ms. [name] is here for medical wt loss. She has dramatically reduced sugar/cho intake and started once weekly gym session (cardio and wt training) with her daughter. She continues to abstain from tobacco/ETOH She has lost 4 lbs since last OV 05/2017 Current wt 150 Goal wt 130  Patient Care Team    Relationship Specialty Notifications Start End  William Hamburgeranford, Penni Penado D, NP PCP - General Family Medicine  06/03/17   Carrington ClampHorvath, Michelle, MD Consulting Physician Obstetrics and Gynecology  06/03/17     Patient Active Problem List   Diagnosis Date Noted  . BMI 30.0-30.9,adult 06/03/2017  . Healthcare maintenance 06/03/2017  . Blood in urine 05/29/2017  . Migraine 05/29/2017  . Anxiety 12/23/2014  . Hypertension 03/26/2013     Past Medical History:  Diagnosis Date  . Frequent UTI   . Hypertension      History reviewed. No pertinent surgical history.   Family History  Problem Relation Age of Onset  . Arthritis Mother   . Cancer Father        prostate  . Heart attack Father   . Healthy Sister   . Healthy Brother   . Hypertension Daughter   . Healthy Brother   . Healthy Daughter   . Healthy Daughter      Social History   Substance and Sexual Activity  Drug Use No     Social History   Substance and Sexual Activity  Alcohol Use No     Social History   Tobacco Use  Smoking Status Never Smoker  Smokeless Tobacco Never Used     Outpatient Encounter Medications as of 09/02/2017  Medication Sig  . FLUoxetine (PROZAC) 40 MG capsule Take 1 capsule by mouth daily.  Marland Kitchen. ibuprofen (ADVIL,MOTRIN) 800 MG tablet Take 1 tablet (800 mg total) by mouth 3 (three) times daily.  Marland Kitchen. lisinopril-hydrochlorothiazide (PRINZIDE,ZESTORETIC) 20-12.5 MG tablet Take 1 tablet by mouth daily.  Marland Kitchen. loratadine (CLARITIN) 10 MG tablet Take 10 mg by mouth daily.  . norethindrone-ethinyl estradiol  (JUNEL FE 1/20) 1-20 MG-MCG tablet Take 1 tablet by mouth daily.  . phentermine (ADIPEX-P) 37.5 MG tablet Take 1 tablet (37.5 mg total) by mouth daily before breakfast.   No facility-administered encounter medications on file as of 09/02/2017.     Allergies: Cyclobenzaprine  Body mass index is 29.43 kg/m.  Blood pressure 101/67, pulse 68, height 5' (1.524 m), weight 150 lb 11.2 oz (68.4 kg), SpO2 98 %.  Review of Systems  Constitutional: Positive for fatigue. Negative for activity change, appetite change, chills, diaphoresis, fever and unexpected weight change.  Eyes: Negative for visual disturbance.  Respiratory: Negative for chest tightness, shortness of breath, wheezing and stridor.   Cardiovascular: Negative for chest pain, palpitations and leg swelling.  Gastrointestinal: Negative for abdominal distention, abdominal pain, blood in stool, constipation, diarrhea, nausea and vomiting.  Endocrine: Negative for cold intolerance, heat intolerance, polydipsia, polyphagia and polyuria.  Genitourinary: Negative for difficulty urinating and flank pain.  Musculoskeletal: Negative for arthralgias, back pain, gait problem, joint swelling, myalgias, neck pain and neck stiffness.  Neurological: Negative for dizziness and headaches.  Hematological: Does not bruise/bleed easily.  Psychiatric/Behavioral: Negative for behavioral problems, confusion, decreased concentration, dysphoric mood, hallucinations, self-injury, sleep disturbance and suicidal ideas. The patient is not nervous/anxious and is not hyperactive.        Objective:   Physical  Exam  Constitutional: She is oriented to person, place, and time. She appears well-developed and well-nourished. No distress.  HENT:  Head: Normocephalic and atraumatic.  Right Ear: External ear normal.  Left Ear: External ear normal.  Nose: Nose normal.  Mouth/Throat: Oropharynx is clear and moist.  Eyes: Pupils are equal, round, and reactive to light.  Conjunctivae and EOM are normal.  Cardiovascular: Normal rate, regular rhythm, normal heart sounds and intact distal pulses.  No murmur heard. Pulmonary/Chest: Effort normal and breath sounds normal. No stridor. No respiratory distress. She has no wheezes. She has no rales. She exhibits no tenderness.  Neurological: She is alert and oriented to person, place, and time.  Skin: Skin is warm and dry. Capillary refill takes less than 2 seconds. No rash noted. She is not diaphoretic. No erythema. No pallor.  Psychiatric: She has a normal mood and affect. Her behavior is normal. Judgment and thought content normal.  Nursing note and vitals reviewed.     Assessment & Plan:   1. Essential hypertension   2. BMI 29.0-29.9,adult     BMI 29.0-29.9,adult She has lost 4 lbs since last OV [facility]- no aberrancies noted. Please start phentermine 37.5mg  once daily. Continue to drink plenty of water and follow Mediterranean diet. Increase regular exercise.  Recommend at least 30 minutes daily, 5 days per week of walking, jogging, biking, swimming, YouTube/Pinterest workout videos. Follow-up in 3 weeks  Hypertension BP at goal 101/67, HR 68 Continue Lisinopril/HCTZ 20/12.5mg  QD    FOLLOW-UP:  Return in about 3 weeks (around 09/23/2017) for Regular Follow Up, Medical Weight Loss.

## 2017-09-02 NOTE — Patient Instructions (Signed)
Mediterranean Diet A Mediterranean diet refers to food and lifestyle choices that are based on the traditions of countries located on the [place]. This way of eating has been shown to help prevent certain conditions and improve outcomes for people who have chronic diseases, like kidney disease and heart disease. What are tips for following this plan? Lifestyle  Cook and eat meals together with your family, when possible.  Drink enough fluid to keep your urine clear or pale yellow.  Be physically active every day. This includes: ? Aerobic exercise like running or swimming. ? Leisure activities like gardening, walking, or housework.  Get 7-8 hours of sleep each night.  If recommended by your health care provider, drink red wine in moderation. This means 1 glass a day for nonpregnant women and 2 glasses a day for men. A glass of wine equals 5 oz (150 mL). Reading food labels  Check the serving size of packaged foods. For foods such as rice and pasta, the serving size refers to the amount of cooked product, not dry.  Check the total fat in packaged foods. Avoid foods that have saturated fat or trans fats.  Check the ingredients list for added sugars, such as corn syrup. Shopping  At the grocery store, buy most of your food from the areas near the walls of the store. This includes: ? Fresh fruits and vegetables (produce). ? Grains, beans, nuts, and seeds. Some of these may be available in unpackaged forms or large amounts (in bulk). ? Fresh seafood. ? Poultry and eggs. ? Low-fat dairy products.  Buy whole ingredients instead of prepackaged foods.  Buy fresh fruits and vegetables in-season from local farmers markets.  Buy frozen fruits and vegetables in resealable bags.  If you do not have access to quality fresh seafood, buy precooked frozen shrimp or canned fish, such as tuna, salmon, or sardines.  Buy small amounts of raw or cooked vegetables, salads, or olives from the  deli or salad bar at your store.  Stock your pantry so you always have certain foods on hand, such as olive oil, canned tuna, canned tomatoes, rice, pasta, and beans. Cooking  Cook foods with extra-virgin olive oil instead of using butter or other vegetable oils.  Have meat as a side dish, and have vegetables or grains as your main dish. This means having meat in small portions or adding small amounts of meat to foods like pasta or stew.  Use beans or vegetables instead of meat in common dishes like chili or lasagna.  Experiment with different cooking methods. Try roasting or broiling vegetables instead of steaming or sauteing them.  Add frozen vegetables to soups, stews, pasta, or rice.  Add nuts or seeds for added healthy fat at each meal. You can add these to yogurt, salads, or vegetable dishes.  Marinate fish or vegetables using olive oil, lemon juice, garlic, and fresh herbs. Meal planning  Plan to eat 1 vegetarian meal one day each week. Try to work up to 2 vegetarian meals, if possible.  Eat seafood 2 or more times a week.  Have healthy snacks readily available, such as: ? Vegetable sticks with hummus. ? Greek yogurt. ? Fruit and nut trail mix.  Eat balanced meals throughout the week. This includes: ? Fruit: 2-3 servings a day ? Vegetables: 4-5 servings a day ? Low-fat dairy: 2 servings a day ? Fish, poultry, or lean meat: 1 serving a day ? Beans and legumes: 2 or more servings a week ? Nuts   and seeds: 1-2 servings a day ? Whole grains: 6-8 servings a day ? Extra-virgin olive oil: 3-4 servings a day  Limit red meat and sweets to only a few servings a month What are my food choices?  Mediterranean diet ? Recommended ? Grains: Whole-grain pasta. Brown rice. Bulgar wheat. Polenta. Couscous. Whole-wheat bread. [place]. ? Vegetables: Artichokes. Beets. Broccoli. Cabbage. Carrots. Eggplant. Green beans. Chard. Kale. Spinach. Onions. Leeks. Peas. Squash.  Tomatoes. Peppers. Radishes. ? Fruits: Apples. Apricots. Avocado. Berries. Bananas. Cherries. Dates. Figs. Grapes. Lemons. Melon. Oranges. Peaches. Plums. Pomegranate. ? Meats and other protein foods: Beans. Almonds. Sunflower seeds. Pine nuts. Peanuts. [place]. Salmon. Scallops. Shrimp. [place]. Tilapia. Clams. Oysters. Eggs. ? Dairy: Low-fat milk. Cheese. Greek yogurt. ? Beverages: Water. Red wine. Herbal tea. ? Fats and oils: Extra virgin olive oil. Avocado oil. Grape seed oil. ? Sweets and desserts: [place] yogurt with honey. Baked apples. Poached pears. Trail mix. ? Seasoning and other foods: Basil. Cilantro. Coriander. Cumin. Mint. Parsley. Sage. Rosemary. Tarragon. Garlic. Oregano. Thyme. Pepper. Balsalmic vinegar. Tahini. Hummus. Tomato sauce. Olives. Mushrooms. ? Limit these ? Grains: Prepackaged pasta or rice dishes. Prepackaged cereal with added sugar. ? Vegetables: Deep fried potatoes (french fries). ? Fruits: Fruit canned in syrup. ? Meats and other protein foods: Beef. Pork. Lamb. Poultry with skin. Hot dogs. Bernie[place] Salines. ? Dairy: Ice cream. Sour cream. Whole milk. ? Beverages: Juice. Sugar-sweetened soft drinks. Beer. Liquor and spirits. ? Fats and oils: Butter. Canola oil. Vegetable oil. Beef fat (tallow). Lard. ? Sweets and desserts: Cookies. Cakes. Pies. Candy. ? Seasoning and other foods: Mayonnaise. Premade sauces and marinades. ? The items listed may not be a complete list. Talk with your dietitian about what dietary choices are right for you. Summary  The Mediterranean diet includes both food and lifestyle choices.  Eat a variety of fresh fruits and vegetables, beans, nuts, seeds, and whole grains.  Limit the amount of red meat and sweets that you eat.  Talk with your health care provider about whether it is safe for you to drink red wine in moderation. This means 1 glass a day for nonpregnant women and 2 glasses a day for men. A glass of wine equals 5 oz (150 mL). This information  is not intended to replace advice given to you by your health care provider. Make sure you discuss any questions you have with your health care provider. Document Released: 08/31/2015 Document Revised: 10/03/2015 Document Reviewed: 08/31/2015 Elsevier Interactive Patient Education  2018 [employer].   Exercising to [employer] Exercising can help you to lose weight. In order to lose weight through exercise, you need to do vigorous-intensity exercise. You can tell that you are exercising with vigorous intensity if you are breathing very hard and fast and cannot hold a conversation while exercising. Moderate-intensity exercise helps to maintain your current weight. You can tell that you are exercising at a moderate level if you have a higher heart rate and faster breathing, but you are still able to hold a conversation. How often should I exercise? Choose an activity that you enjoy and set realistic goals. Your health care provider can help you to make an activity plan that works for you. Exercise regularly as directed by your health care provider. This may include:  Doing resistance training twice each week, such as: ? Push-ups. ? Sit-ups. ? Lifting weights. ? Using resistance bands.  Doing a given intensity of exercise for a given amount of time. Choose from these options: ? 150  minutes of moderate-intensity exercise every week. ? 75 minutes of vigorous-intensity exercise every week. ? A mix of moderate-intensity and vigorous-intensity exercise every week.  Children, pregnant women, people who are out of shape, people who are overweight, and older adults may need to consult a health care provider for individual recommendations. If you have any sort of medical condition, be sure to consult your health care provider before starting a new exercise program. What are some activities that can help me to lose weight?  Walking at a rate of at least 4.5 miles an hour.  Jogging or running at a  rate of 5 miles per hour.  Biking at a rate of at least 10 miles per hour.  Lap swimming.  Roller-skating or in-line skating.  Cross-country skiing.  Vigorous competitive sports, such as football, basketball, and soccer.  Jumping rope.  Aerobic dancing. How can I be more active in my day-to-day activities?  Use the stairs instead of the elevator.  Take a walk during your lunch break.  If you drive, park your car farther away from work or school.  If you take public transportation, get off one stop early and walk the rest of the way.  Make all of your phone calls while standing up and walking around.  Get up, stretch, and walk around every 30 minutes throughout the day. What guidelines should I follow while exercising?  Do not exercise so much that you hurt yourself, feel dizzy, or get very short of breath.  Consult your health care provider prior to starting a new exercise program.  Wear comfortable clothes and shoes with good support.  Drink plenty of water while you exercise to prevent dehydration or heat stroke. Body water is lost during exercise and must be replaced.  Work out until you breathe faster and your heart beats faster. This information is not intended to replace advice given to you by your health care provider. Make sure you discuss any questions you have with your health care provider. Document Released: 02/09/2010 Document Revised: 06/15/2015 Document Reviewed: 06/10/2013 Elsevier Interactive Patient Education  2018 [employer]  Please start phentermine 37.5mg  once daily. Continue to drink plenty of water and follow Mediterranean diet. Increase regular exercise.  Recommend at least 30 minutes daily, 5 days per week of walking, jogging, biking, swimming, YouTube/Pinterest workout videos. Follow-up in 3 weeks. GREAT JOB SO FAR! NICE TO SEE YOU!

## 2017-09-15 ENCOUNTER — Other Ambulatory Visit: Payer: Self-pay

## 2017-09-15 ENCOUNTER — Emergency Department
Admission: EM | Admit: 2017-09-15 | Discharge: 2017-09-15 | Disposition: A | Discharge status: Home / Self Care | Payer: BLUE CROSS/BLUE SHIELD | Attending: Emergency Medicine | Admitting: Emergency Medicine

## 2017-09-15 ENCOUNTER — Encounter: Payer: Self-pay | Admitting: Emergency Medicine

## 2017-09-15 ENCOUNTER — Emergency Department: Payer: BLUE CROSS/BLUE SHIELD

## 2017-09-15 DIAGNOSIS — Y33XXXA Other specified events, undetermined intent, initial encounter: Secondary | ICD-10-CM | POA: Diagnosis not present

## 2017-09-15 DIAGNOSIS — Y9389 Activity, other specified: Secondary | ICD-10-CM | POA: Diagnosis not present

## 2017-09-15 DIAGNOSIS — Z79899 Other long term (current) drug therapy: Secondary | ICD-10-CM | POA: Insufficient documentation

## 2017-09-15 DIAGNOSIS — I10 Essential (primary) hypertension: Secondary | ICD-10-CM | POA: Diagnosis not present

## 2017-09-15 DIAGNOSIS — Y929 Unspecified place or not applicable: Secondary | ICD-10-CM | POA: Insufficient documentation

## 2017-09-15 DIAGNOSIS — Y998 Other external cause status: Secondary | ICD-10-CM | POA: Diagnosis not present

## 2017-09-15 DIAGNOSIS — M79605 Pain in left leg: Secondary | ICD-10-CM | POA: Diagnosis not present

## 2017-09-15 DIAGNOSIS — S86812A Strain of other muscle(s) and tendon(s) at lower leg level, left leg, initial encounter: Secondary | ICD-10-CM

## 2017-09-15 DIAGNOSIS — M79662 Pain in left lower leg: Secondary | ICD-10-CM | POA: Diagnosis not present

## 2017-09-15 DIAGNOSIS — M79606 Pain in leg, unspecified: Secondary | ICD-10-CM

## 2017-09-15 DIAGNOSIS — S8992XA Unspecified injury of left lower leg, initial encounter: Secondary | ICD-10-CM | POA: Diagnosis not present

## 2017-09-15 DIAGNOSIS — M7989 Other specified soft tissue disorders: Secondary | ICD-10-CM | POA: Diagnosis not present

## 2017-09-15 DIAGNOSIS — S86912A Strain of unspecified muscle(s) and tendon(s) at lower leg level, left leg, initial encounter: Secondary | ICD-10-CM | POA: Diagnosis not present

## 2017-09-15 IMAGING — DX DG TIBIA/FIBULA 2V*L*
2 series · 2 of 2 positions shown · non-contrast
Comparison: None.

CLINICAL DATA: Left leg pain.  Heard pop in the calf.

EXAM:
LEFT TIBIA AND FIBULA - 2 VIEW

[tibia ap (1 of 2)]
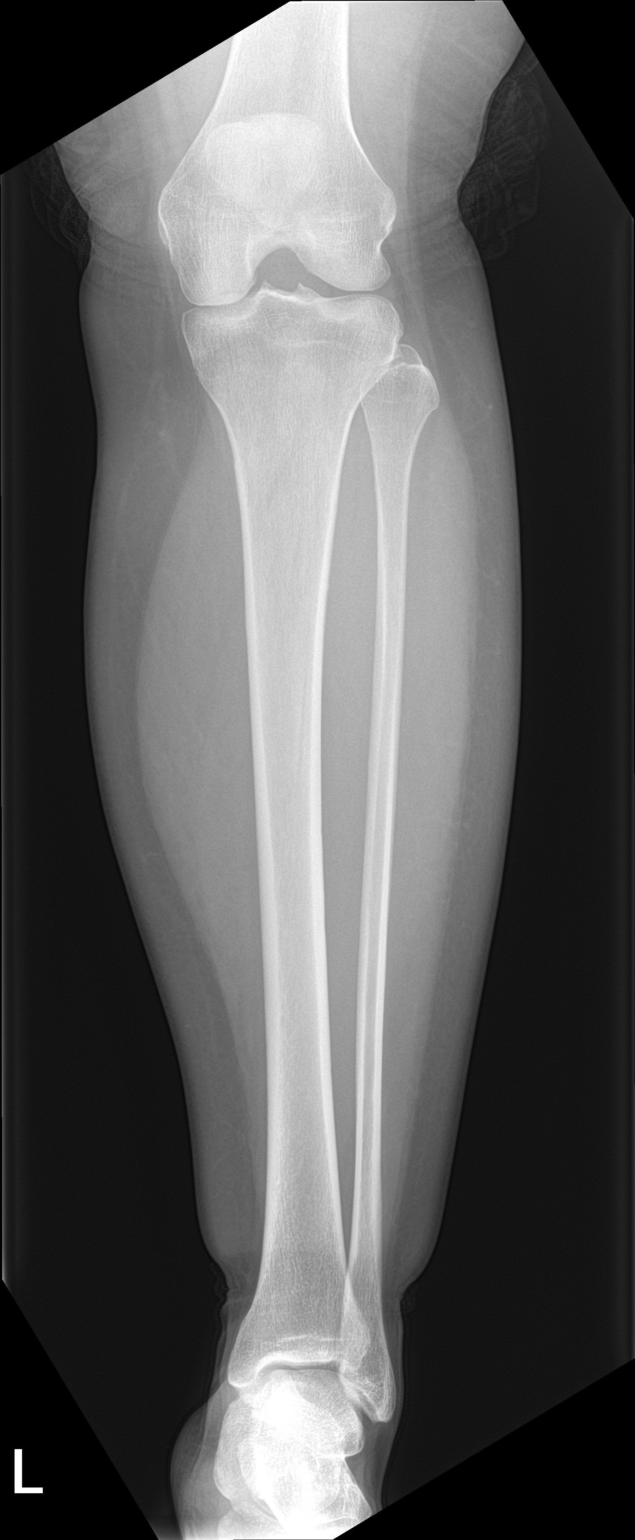

[tibia ap (2 of 2)]
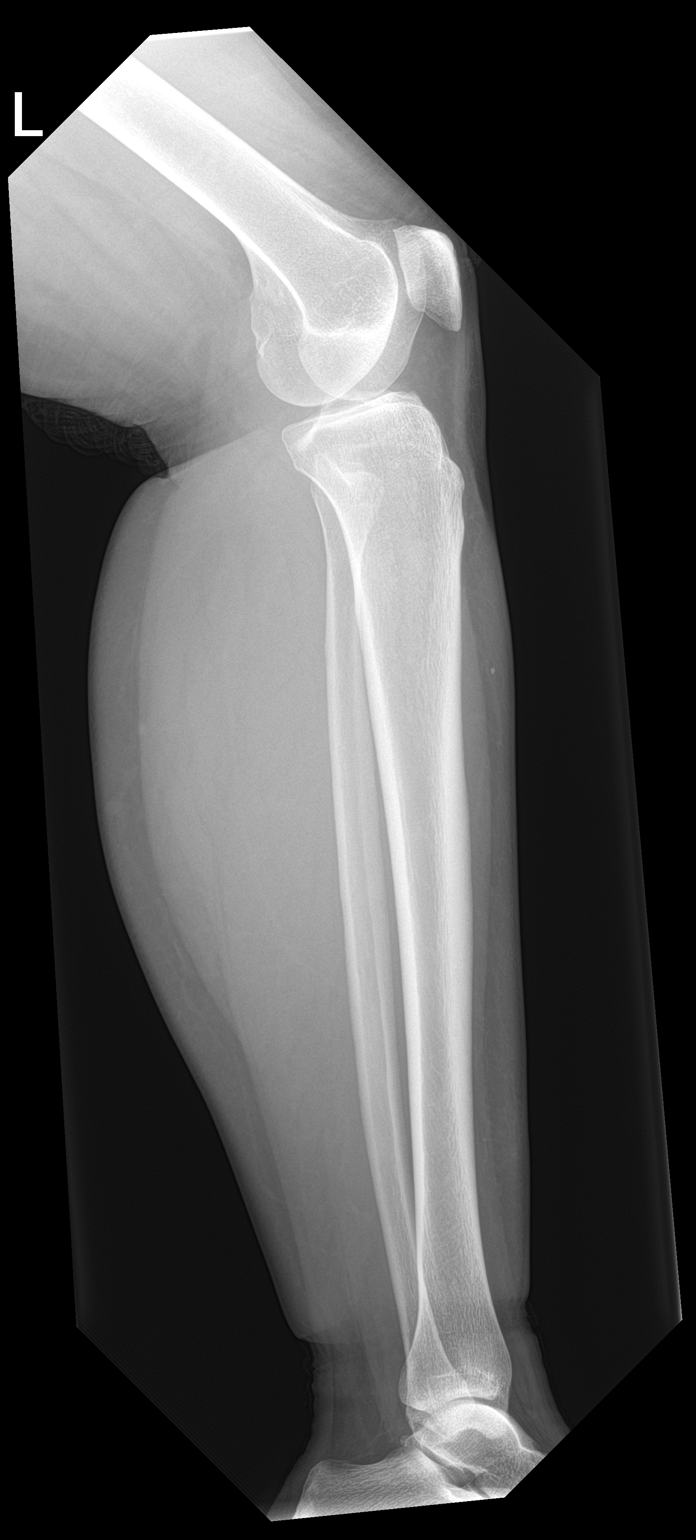

[2 of 2 positions shown; findings below may reference images not displayed]

FINDINGS: There is no evidence of fracture or other focal bone lesions. Soft
tissues are unremarkable.
IMPRESSION: Negative.

## 2017-09-15 IMAGING — US US EXTREM LOW VENOUS*L*
1 series · 13 of 24 positions shown · non-contrast
Comparison: None

CLINICAL DATA: LEFT leg/calf pain, swelling, onset at [9G] hours
this morning



[Series 1: us extrem low venous*left* · 13 of 34 slices shown]
[im 1/34]
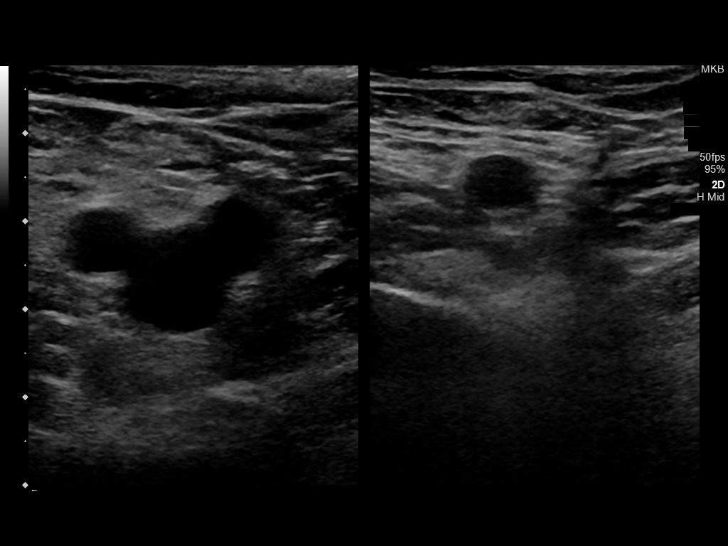
[im 3/34]
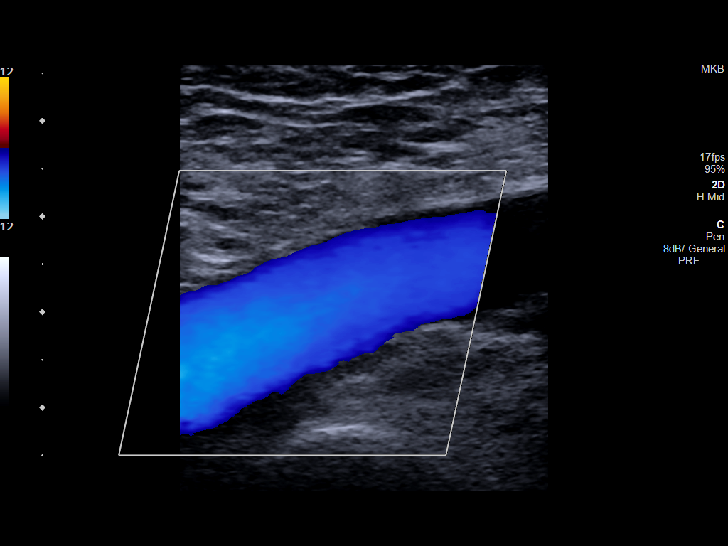
[im 6/34]
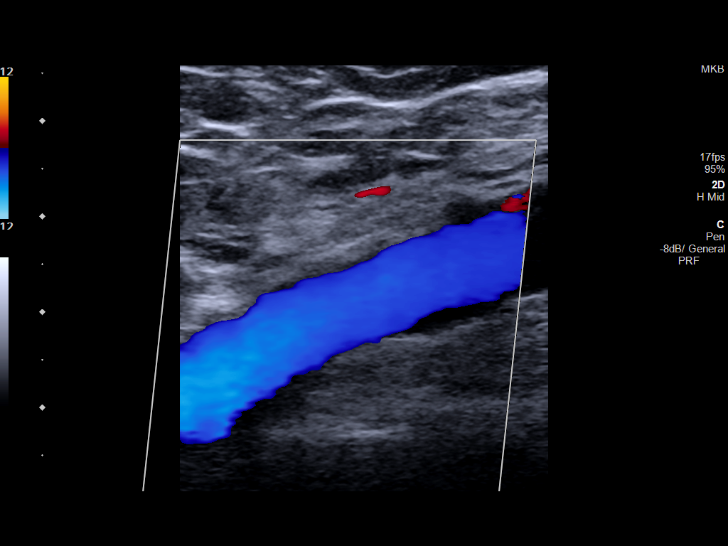
[im 9/34]
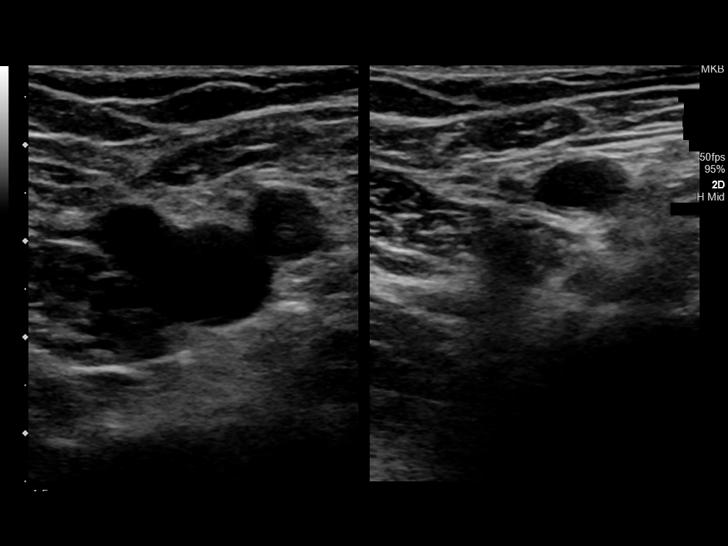
[im 12/34]
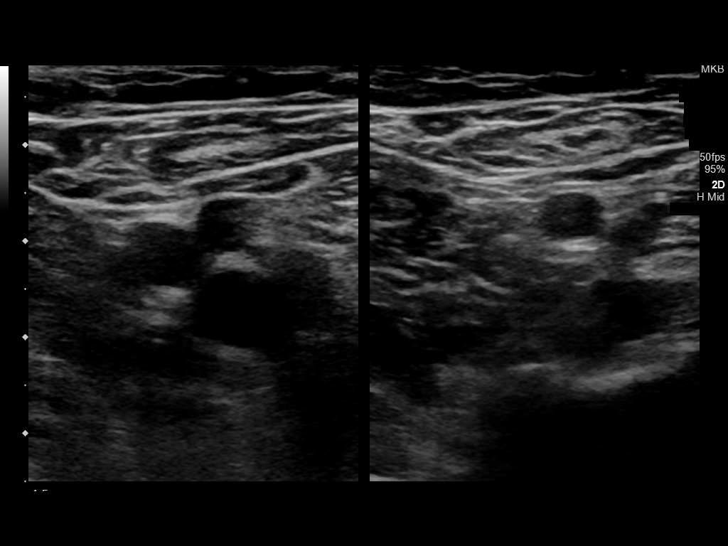
[im 15/34]
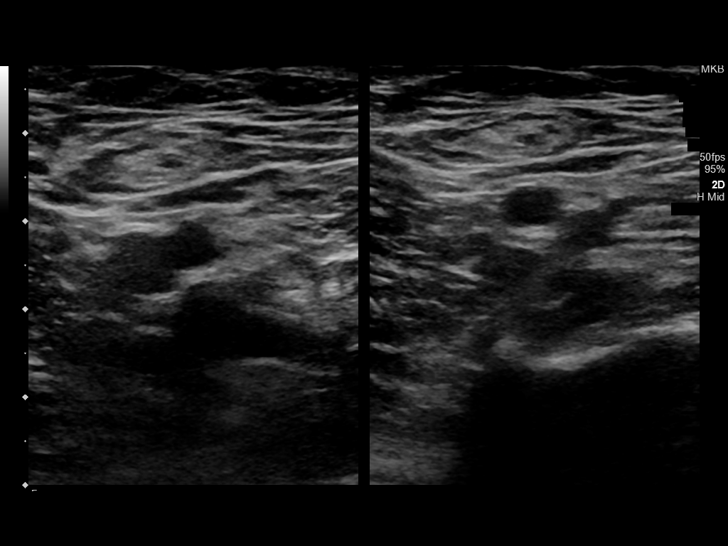
[im 18/34]
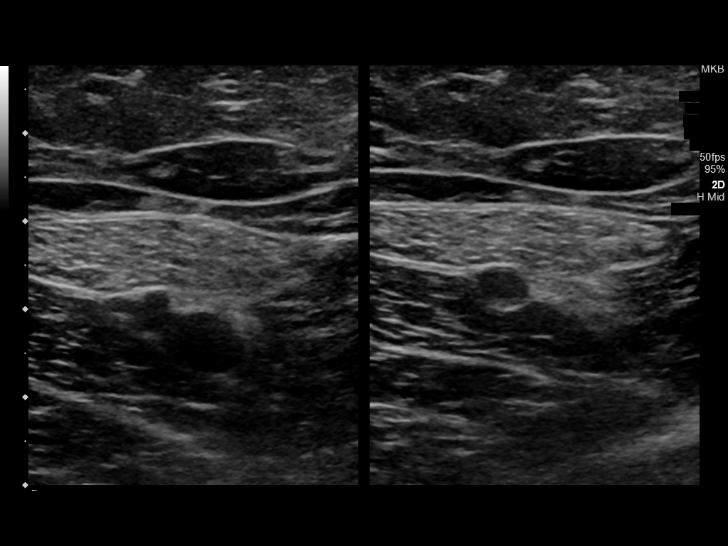
[im 19/34]
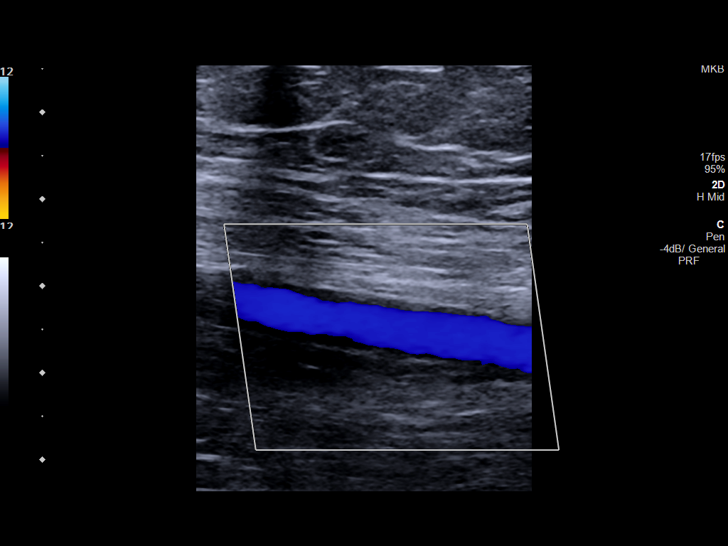
[im 22/34]
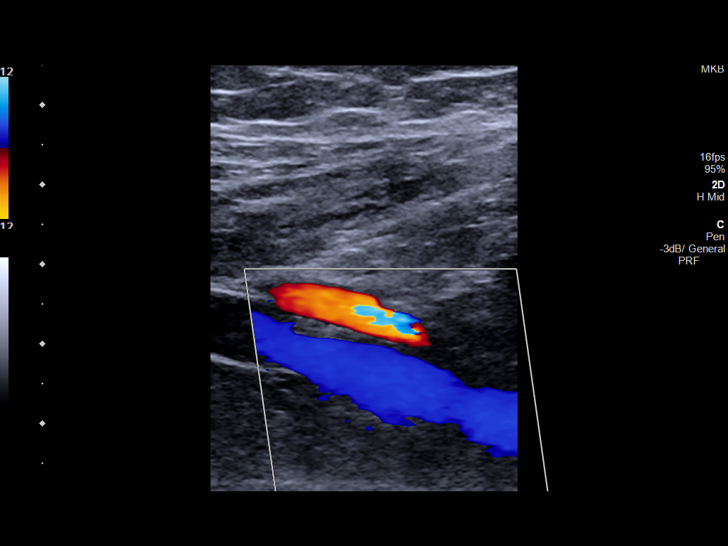
[im 25/34]
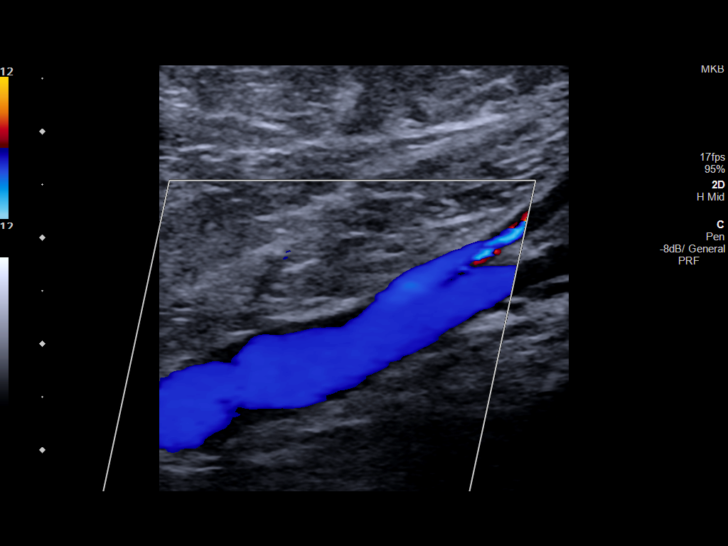
[im 28/34]
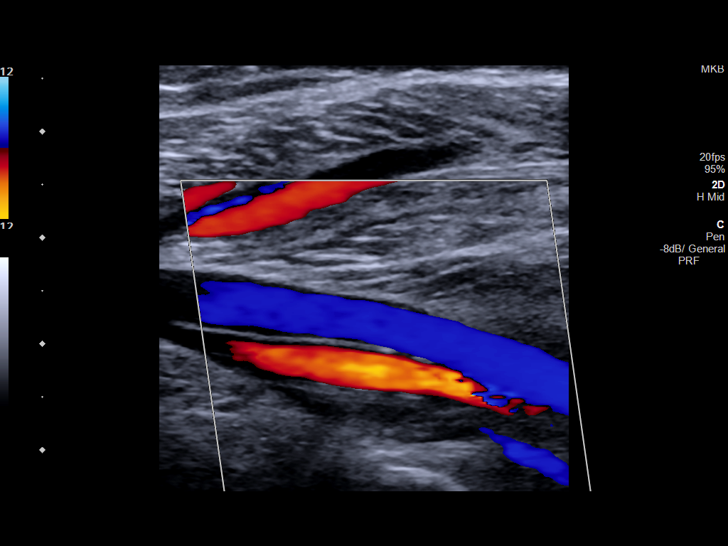
[im 31/34]
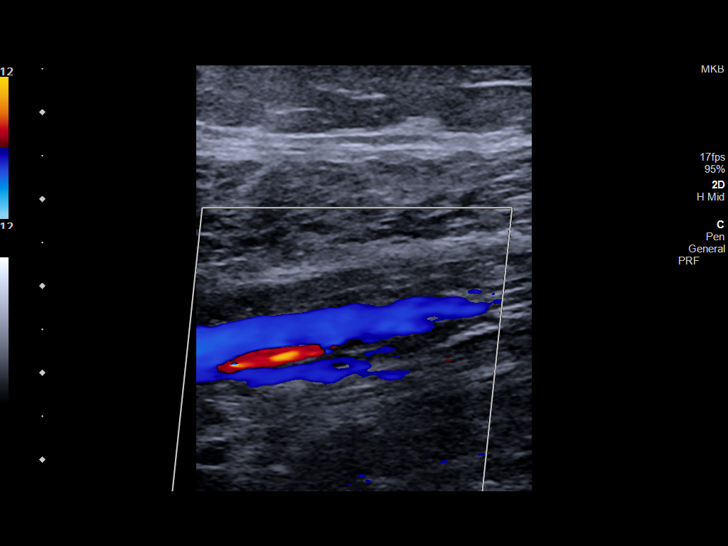
[im 34/34]
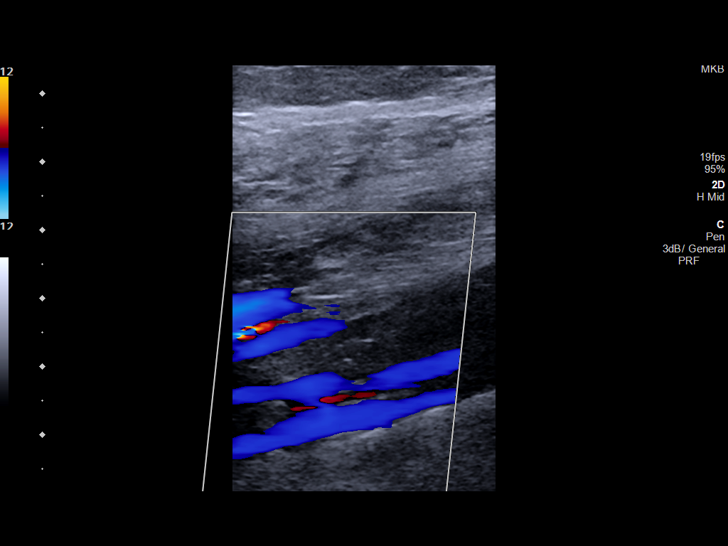

[13 of 24 positions shown; findings below may reference images not displayed]

FINDINGS: Contralateral Common Femoral Vein: Respiratory phasicity is normal
and symmetric with the symptomatic side. No evidence of thrombus.
Normal compressibility.

Common Femoral Vein: No evidence of thrombus. Normal
compressibility, respiratory phasicity and response to augmentation.

Saphenofemoral Junction: No evidence of thrombus. Normal
compressibility and flow on color Doppler imaging.

Profunda Femoral Vein: No evidence of thrombus. Normal
compressibility and flow on color Doppler imaging.

Femoral Vein: No evidence of thrombus. Normal compressibility,
respiratory phasicity and response to augmentation.

Popliteal Vein: No evidence of thrombus. Normal compressibility,
respiratory phasicity and response to augmentation.

Calf Veins: No evidence of thrombus. Normal compressibility and flow
on color Doppler imaging.

Superficial Great Saphenous Vein: No evidence of thrombus. Normal
compressibility.

Venous Reflux:  None.

Other Findings:  None.
IMPRESSION: No evidence of deep venous thrombosis in the LEFT lower extremity.

## 2017-09-15 MED ORDER — MELOXICAM 15 MG PO TABS: 15.0000 mg | ORAL_TABLET | Freq: Every day | ORAL | 0 refills | Status: DC

## 2017-09-15 NOTE — ED Provider Notes (Addendum)
[facility] EMERGENCY DEPARTMENT Provider Note   CSN: [id number] Arrival date & time: 09/15/17  1617     History   Chief Complaint Chief Complaint  Patient presents with  . Leg Pain    HPI Monica Reed is a 47 y.o. female.  Presents to the emergency department today for evaluation of left leg pain.  Patient states she was pulling a heavy cart and as she was pulling the car she felt a sharp pop in her left calf.  She had a hard time walking after feeling the pop and throughout the day she is had some mild swelling throughout the left calf.  Patient can ambulate but with significant limping.  She has not had any medications for pain.  Pain is 9 out of 10 with standing 0 out of 10 with sitting.  Accident occurred at work this morning, 7:30 AM.  HPI  Past Medical History:  Diagnosis Date  . Frequent UTI   . Hypertension     Patient Active Problem List   Diagnosis Date Noted  . BMI 29.0-29.9,adult 09/02/2017  . Healthcare maintenance 06/03/2017  . Blood in urine 05/29/2017  . Migraine 05/29/2017  . Anxiety 12/23/2014  . Hypertension 03/26/2013    History reviewed. No pertinent surgical history.   OB History   None      Home Medications    Prior to Admission medications   Medication Sig Start Date End Date Taking? Authorizing Provider  FLUoxetine (PROZAC) 40 MG capsule Take 1 capsule by mouth daily. 04/24/17   [provider]  lisinopril-hydrochlorothiazide (PRINZIDE,ZESTORETIC) 20-12.5 MG tablet Take 1 tablet by mouth daily. 04/24/17   [provider]  loratadine (CLARITIN) 10 MG tablet Take 10 mg by mouth daily.    [provider]  meloxicam (MOBIC) 15 MG tablet Take 1 tablet (15 mg total) by mouth daily. 09/15/17 09/15/18  Evon Slack, PA-C  norethindrone-ethinyl estradiol (JUNEL FE 1/20) 1-20 MG-MCG tablet Take 1 tablet by mouth daily. 01/31/17   [provider]  phentermine (ADIPEX-P) 37.5 MG tablet Take 1  tablet (37.5 mg total) by mouth daily before breakfast. 09/02/17   Danford, Jinny Blossom, NP    Family History Family History  Problem Relation Age of Onset  . Arthritis Mother   . Cancer Father        prostate  . Heart attack Father   . Healthy Sister   . Healthy Brother   . Hypertension Daughter   . Healthy Brother   . Healthy Daughter   . Healthy Daughter     Social History Social History   Tobacco Use  . Smoking status: Never Smoker  . Smokeless tobacco: Never Used  Substance Use Topics  . Alcohol use: No  . Drug use: No     Allergies   Cyclobenzaprine   Review of Systems Review of Systems  Constitutional: Negative for activity change.  Respiratory: Negative for shortness of breath.   Cardiovascular: Positive for leg swelling.  Musculoskeletal: Positive for gait problem and myalgias. Negative for arthralgias, joint swelling, neck pain and neck stiffness.  Skin: Negative for wound.  Neurological: Negative for dizziness, syncope, weakness, light-headedness, numbness and headaches.  Psychiatric/Behavioral: Negative for confusion and decreased concentration.     Physical Exam Updated Vital Signs BP 122/72   Pulse 85   Temp 98.6 F (37 C) (Oral)   Resp 18   Ht 5' (1.524 m)   Wt 66.2 kg   SpO2 98%  BMI 28.51 kg/m   Physical Exam  Constitutional: She is oriented to person, place, and time. She appears well-developed and well-nourished.  HENT:  Head: Normocephalic and atraumatic.  Eyes: Conjunctivae are normal.  Neck: Normal range of motion.  Cardiovascular: Normal rate.  Pulmonary/Chest: Effort normal. No respiratory distress.  Musculoskeletal: Normal range of motion.  Examination of the left lower extremity shows full range of motion of the hip knee and ankle.  She has a negative Thompson sign.  She has mild tenderness throughout the belly of the calf, moderate tenderness with compression of the calf.  She has minimal swelling noted throughout the calf and  lower leg.  Sensations is intact distally.  2+ dorsalis pedis pulse.  She is able to actively ankle plantarflex and dorsiflex.  She has moderate pain with passive dorsiflexion of the ankle reproducing calf pain.  Compartments are soft.  Neurological: She is alert and oriented to person, place, and time.  Skin: Skin is warm. No rash noted.  Psychiatric: She has a normal mood and affect. Her behavior is normal. Thought content normal.     ED Treatments / Results  Labs (all labs ordered are listed, but only abnormal results are displayed) Labs Reviewed - No data to display  EKG None  Radiology Dg Tibia/fibula Left  Result Date: 09/15/2017 CLINICAL DATA:  Left leg pain.  Heard pop in the calf. EXAM: LEFT TIBIA AND FIBULA - 2 VIEW COMPARISON:  None. FINDINGS: There is no evidence of fracture or other focal bone lesions. Soft tissues are unremarkable. IMPRESSION: Negative. Electronically Signed   By: Tollie Eth M.D.   On: 09/15/2017 18:02   US Venous Img Lower Unilateral Left  Result Date: 09/15/2017 CLINICAL DATA:  LEFT leg/calf pain, swelling, onset at 0730 hours this morning EXAM: LEFT LOWER EXTREMITY VENOUS DOPPLER ULTRASOUND TECHNIQUE: Gray-scale sonography with graded compression, as well as color Doppler and duplex ultrasound were performed to evaluate the lower extremity deep venous systems from the level of the common femoral vein and including the common femoral, femoral, profunda femoral, popliteal and calf veins including the posterior tibial, peroneal and gastrocnemius veins when visible. The superficial great saphenous vein was also interrogated. Spectral Doppler was utilized to evaluate flow at rest and with distal augmentation maneuvers in the common femoral, femoral and popliteal veins. COMPARISON:  None FINDINGS: Contralateral Common Femoral Vein: Respiratory phasicity is normal and symmetric with the symptomatic side. No evidence of thrombus. Normal compressibility. Common Femoral  Vein: No evidence of thrombus. Normal compressibility, respiratory phasicity and response to augmentation. Saphenofemoral Junction: No evidence of thrombus. Normal compressibility and flow on color Doppler imaging. Profunda Femoral Vein: No evidence of thrombus. Normal compressibility and flow on color Doppler imaging. Femoral Vein: No evidence of thrombus. Normal compressibility, respiratory phasicity and response to augmentation. Popliteal Vein: No evidence of thrombus. Normal compressibility, respiratory phasicity and response to augmentation. Calf Veins: No evidence of thrombus. Normal compressibility and flow on color Doppler imaging. Superficial Great Saphenous Vein: No evidence of thrombus. Normal compressibility. Venous Reflux:  None. Other Findings:  None. IMPRESSION: No evidence of deep venous thrombosis in the LEFT lower extremity. Electronically Signed   By: Ulyses Southward M.D.   On: 09/15/2017 18:12    Procedures .Splint Application Date/Time: 09/15/2017 6:52 PM Performed by: Evon Slack, PA-C Authorized by: Evon Slack, PA-C   Consent:    Consent obtained:  Verbal   Consent given by:  Patient   Alternatives discussed:  No treatment Pre-procedure  details:    Sensation:  Normal Procedure details:    Laterality:  Left   Location:  Leg   Leg:  L lower leg   Strapping: no     Splint type:  Short leg   Supplies:  Elastic bandage, Ortho-Glass and cotton padding Post-procedure details:    Pain:  Improved   Sensation:  Normal   Patient tolerance of procedure:  Tolerated well, no immediate complications   (including critical care time)  Medications Ordered in ED Medications - No data to display   Initial Impression / Assessment and Plan / ED Course  I have reviewed the triage vital signs and the nursing notes.  Pertinent labs & imaging results that were available during my care of the patient were reviewed by me and considered in my medical decision making (see chart for  details).     47 year old female with work injury at work at 7:30 AM this morning with pulling a heavy cart.  She felt a pop in her left calf.  She developed some pain swelling and difficulty with ambulation.  Patient reported to urgent care facility who sent her to the emergency department for ultrasound for DVT.  Ultrasound obtained and was negative for DVT.  X-ray of tib-fib region showed no evidence of acute bony ab normality.  Physical exam and history consistent with calf strain, no sign of Achilles tendon rupture.  She is placed in a posterior splint with plantarflexion.  She is given a walker to help with ambulation.  She will take meloxicam daily for 2 weeks.  She does not return to work until 09/19/2017 and is requested to follow-up with orthopedics before returning to work for recheck.  She understands signs and symptoms return to the ED for such as numbness, increasing pain swelling or severe pain consistent with compartment syndrome.  Final Clinical Impressions(s) / ED Diagnoses   Final diagnoses:  Leg pain  Strain of calf muscle, left, initial encounter    ED Discharge Orders         Ordered    meloxicam (MOBIC) 15 MG tablet  Daily     09/15/17 1845           Evon SlackGaines, Thomas C, PA-C 09/15/17 1854    Evon SlackGaines, Thomas C, PA-C 09/15/17 1854    Jeanmarie PlantMcShane, James A, MD 09/16/17 [phone]

## 2017-09-15 NOTE — Discharge Instructions (Addendum)
Please use walker to help with ambulation.  Avoid putting weight on the left leg.  Keep splint on left lower leg clean and dry at all times.  Do not remove splint until you follow-up with orthopedics.  Return to the ER for any numbness, tingling, increasing pain swelling, worsening symptoms or urgent changes in health.

## 2017-09-15 NOTE — ED Triage Notes (Signed)
Was pulling something at work about 0730 this am and felt pain L leg.

## 2017-09-18 DIAGNOSIS — S86112A Strain of other muscle(s) and tendon(s) of posterior muscle group at lower leg level, left leg, initial encounter: Secondary | ICD-10-CM | POA: Diagnosis not present

## 2017-09-18 DIAGNOSIS — S86819A Strain of other muscle(s) and tendon(s) at lower leg level, unspecified leg, initial encounter: Secondary | ICD-10-CM | POA: Insufficient documentation

## 2017-09-23 NOTE — Progress Notes (Signed)
Subjective:    Patient ID: Monica Reed, female    DOB: [birth year], 47 y.o.   MRN: [id number]  HPI:  09/02/17 OV: Monica Reed is here for medical wt loss. She has dramatically reduced sugar/cho intake and started once weekly gym session (cardio and wt training) with her daughter. She continues to abstain from tobacco/ETOH She has lost 4 lbs since last OV 05/2017 Current wt 150 Goal wt 130  09/24/17 OV: Monica Reed is here for medical wt loss f/u: She has been on Phentermine 37.5mg  QD for four weeks and has lost 10 lbs since last OV 09/02/17 She denies CP/dyspnea/dizziness/HA/palpitations/insomnia She has eliminated all soda and sugar from diet and has increased vegetables and lean protein intake. She has been unable to exercise the last few weeks due L gastrocnemius strain r/t work injury on 09/15/17 She was treated with posterior splint with plantarflexion and Meloxicam, f/u on Sept 11, 2019 She denies current pain and only minor difficulty ambulating.  Patient Care Team    Relationship Specialty Notifications Start End  William Hamburger D, NP PCP - General Family Medicine  06/03/17   Carrington Clamp, MD Consulting Physician Obstetrics and Gynecology  06/03/17     Patient Active Problem List   Diagnosis Date Noted  . Strain of gastrocnemius muscle of left lower extremity 09/24/2017  . BMI 27.0-27.9,adult 09/02/2017  . Healthcare maintenance 06/03/2017  . Blood in urine 05/29/2017  . Migraine 05/29/2017  . Anxiety 12/23/2014  . Hypertension 03/26/2013     Past Medical History:  Diagnosis Date  . Frequent UTI   . Hypertension      History reviewed. No pertinent surgical history.   Family History  Problem Relation Age of Onset  . Arthritis Mother   . Cancer Father        prostate  . Heart attack Father   . Healthy Sister   . Healthy Brother   . Hypertension Daughter   . Healthy Brother   . Healthy Daughter   . Healthy Daughter      Social History    Substance and Sexual Activity  Drug Use No     Social History   Substance and Sexual Activity  Alcohol Use No     Social History   Tobacco Use  Smoking Status Never Smoker  Smokeless Tobacco Never Used     Outpatient Encounter Medications as of 09/24/2017  Medication Sig  . FLUoxetine (PROZAC) 40 MG capsule Take 1 capsule by mouth daily.  Marland Kitchen lisinopril-hydrochlorothiazide (PRINZIDE,ZESTORETIC) 20-12.5 MG tablet Take 1 tablet by mouth daily.  Marland Kitchen loratadine (CLARITIN) 10 MG tablet Take 10 mg by mouth daily.  . meloxicam (MOBIC) 15 MG tablet Take 1 tablet (15 mg total) by mouth daily.  . norethindrone-ethinyl estradiol (JUNEL FE 1/20) 1-20 MG-MCG tablet Take 1 tablet by mouth daily.  . phentermine (ADIPEX-P) 37.5 MG tablet Take 1 tablet (37.5 mg total) by mouth daily before breakfast.  . [DISCONTINUED] phentermine (ADIPEX-P) 37.5 MG tablet Take 1 tablet (37.5 mg total) by mouth daily before breakfast.   No facility-administered encounter medications on file as of 09/24/2017.     Allergies: Cyclobenzaprine  Body mass index is 27.48 kg/m.  Blood pressure 125/71, pulse 71, height 5' (1.524 m), weight 140 lb 11.2 oz (63.8 kg), SpO2 100 %.  Review of Systems  Constitutional: Negative for activity change, appetite change, chills, diaphoresis, fatigue, fever and unexpected weight change.  Eyes: Negative for visual disturbance.  Respiratory: Negative for chest tightness, shortness  of breath, wheezing and stridor.   Cardiovascular: Negative for chest pain, palpitations and leg swelling.  Gastrointestinal: Negative for abdominal distention, abdominal pain, blood in stool, constipation, diarrhea, nausea and vomiting.  Endocrine: Negative for cold intolerance, heat intolerance, polydipsia, polyphagia and polyuria.  Genitourinary: Negative for difficulty urinating and flank pain.  Musculoskeletal: Positive for gait problem and myalgias. Negative for arthralgias, back pain, joint  swelling, neck pain and neck stiffness.  Neurological: Negative for dizziness and headaches.  Hematological: Does not bruise/bleed easily.  Psychiatric/Behavioral: Negative for behavioral problems, confusion, decreased concentration, dysphoric mood, hallucinations, self-injury, sleep disturbance and suicidal ideas. The patient is not nervous/anxious and is not hyperactive.        Objective:   Physical Exam  Constitutional: She is oriented to person, place, and time. She appears well-developed and well-nourished. No distress.  HENT:  Head: Normocephalic and atraumatic.  Right Ear: External ear normal.  Left Ear: External ear normal.  Nose: Nose normal.  Mouth/Throat: Oropharynx is clear and moist.  Eyes: Pupils are equal, round, and reactive to light. Conjunctivae and EOM are normal.  Cardiovascular: Normal rate, regular rhythm, normal heart sounds and intact distal pulses.  No murmur heard. Pulmonary/Chest: Effort normal and breath sounds normal. No stridor. No respiratory distress. She has no wheezes. She has no rales. She exhibits no tenderness.  Musculoskeletal:  LLE-posterior splint with plantarflexion in place  Neurological: She is alert and oriented to person, place, and time.  Skin: Skin is warm and dry. Capillary refill takes less than 2 seconds. No rash noted. She is not diaphoretic. No erythema. No pallor.  Psychiatric: She has a normal mood and affect. Her behavior is normal. Judgment and thought content normal.  Nursing note and vitals reviewed.     Assessment & Plan:   1. BMI 27.0-27.9,adult   2. Strain of gastrocnemius muscle of left lower extremity, initial encounter     BMI 27.0-27.9,adult [facility] Controlled Substance Database reviewed- no aberrancies noted. Continue healthy eating, drinking plenty of water, and exercising as tolerated. Two more months of phentermine will be provided, call pharmacy when you need the second month filled. Medication refill  for one month and I you would like to resume for a final three month course, please make office appt.  Strain of gastrocnemius muscle of left lower extremity LLE- posterior splint with plantarflexion in place She has f/u 10/01/2017    FOLLOW-UP:  Return in about 3 months (around 12/24/2017) for Regular Follow Up, Medical Weight Loss.

## 2017-09-24 ENCOUNTER — Ambulatory Visit (INDEPENDENT_AMBULATORY_CARE_PROVIDER_SITE_OTHER): Payer: BLUE CROSS/BLUE SHIELD | Admitting: Adult Health

## 2017-09-24 ENCOUNTER — Encounter: Payer: Self-pay | Admitting: Adult Health

## 2017-09-24 VITALS — BP 125/71 | HR 71 | Ht 60.0 in | Wt 140.7 lb

## 2017-09-24 DIAGNOSIS — S86112A Strain of other muscle(s) and tendon(s) of posterior muscle group at lower leg level, left leg, initial encounter: Secondary | ICD-10-CM | POA: Diagnosis not present

## 2017-09-24 DIAGNOSIS — Z6827 Body mass index (BMI) 27.0-27.9, adult: Secondary | ICD-10-CM

## 2017-09-24 MED ORDER — PHENTERMINE HCL 37.5 MG PO TABS: 37.5000 mg | ORAL_TABLET | Freq: Every day | ORAL | 0 refills | Status: DC

## 2017-09-24 NOTE — Assessment & Plan Note (Signed)
>>  ASSESSMENT AND PLAN FOR BMI 26.0-26.9,ADULT WRITTEN ON 09/24/2017  2:30 PM BY DANFORD, Jinny Blossom, NP  [facility] Controlled Substance Database reviewed- no aberrancies noted. Continue healthy eating, drinking plenty of water, and exercising as tolerated. Two more months of phentermine will be provided, call pharmacy when you need the second month filled. Medication refill for one month and I you would like to resume for a final three month course, please make office appt.

## 2017-09-24 NOTE — Assessment & Plan Note (Addendum)
[place] Controlled Substance Database reviewed- no aberrancies noted. Continue healthy eating, drinking plenty of water, and exercising as tolerated. Two more months of phentermine will be provided, call pharmacy when you need the second month filled. Medication refill for one month and I you would like to resume for a final three month course, please make office appt.

## 2017-09-24 NOTE — Patient Instructions (Signed)
Mediterranean Diet A Mediterranean diet refers to food and lifestyle choices that are based on the traditions of countries located on the [place]. This way of eating has been shown to help prevent certain conditions and improve outcomes for people who have chronic diseases, like kidney disease and heart disease. What are tips for following this plan? Lifestyle  Cook and eat meals together with your family, when possible.  Drink enough fluid to keep your urine clear or pale yellow.  Be physically active every day. This includes: ? Aerobic exercise like running or swimming. ? Leisure activities like gardening, walking, or housework.  Get 7-8 hours of sleep each night.  If recommended by your health care provider, drink red wine in moderation. This means 1 glass a day for nonpregnant women and 2 glasses a day for men. A glass of wine equals 5 oz (150 mL). Reading food labels  Check the serving size of packaged foods. For foods such as rice and pasta, the serving size refers to the amount of cooked product, not dry.  Check the total fat in packaged foods. Avoid foods that have saturated fat or trans fats.  Check the ingredients list for added sugars, such as corn syrup. Shopping  At the grocery store, buy most of your food from the areas near the walls of the store. This includes: ? Fresh fruits and vegetables (produce). ? Grains, beans, nuts, and seeds. Some of these may be available in unpackaged forms or large amounts (in bulk). ? Fresh seafood. ? Poultry and eggs. ? Low-fat dairy products.  Buy whole ingredients instead of prepackaged foods.  Buy fresh fruits and vegetables in-season from local farmers markets.  Buy frozen fruits and vegetables in resealable bags.  If you do not have access to quality fresh seafood, buy precooked frozen shrimp or canned fish, such as tuna, salmon, or sardines.  Buy small amounts of raw or cooked vegetables, salads, or olives from the  deli or salad bar at your store.  Stock your pantry so you always have certain foods on hand, such as olive oil, canned tuna, canned tomatoes, rice, pasta, and beans. Cooking  Cook foods with extra-virgin olive oil instead of using butter or other vegetable oils.  Have meat as a side dish, and have vegetables or grains as your main dish. This means having meat in small portions or adding small amounts of meat to foods like pasta or stew.  Use beans or vegetables instead of meat in common dishes like chili or lasagna.  Experiment with different cooking methods. Try roasting or broiling vegetables instead of steaming or sauteing them.  Add frozen vegetables to soups, stews, pasta, or rice.  Add nuts or seeds for added healthy fat at each meal. You can add these to yogurt, salads, or vegetable dishes.  Marinate fish or vegetables using olive oil, lemon juice, garlic, and fresh herbs. Meal planning  Plan to eat 1 vegetarian meal one day each week. Try to work up to 2 vegetarian meals, if possible.  Eat seafood 2 or more times a week.  Have healthy snacks readily available, such as: ? Vegetable sticks with hummus. ? Greek yogurt. ? Fruit and nut trail mix.  Eat balanced meals throughout the week. This includes: ? Fruit: 2-3 servings a day ? Vegetables: 4-5 servings a day ? Low-fat dairy: 2 servings a day ? Fish, poultry, or lean meat: 1 serving a day ? Beans and legumes: 2 or more servings a week ? Nuts   and seeds: 1-2 servings a day ? Whole grains: 6-8 servings a day ? Extra-virgin olive oil: 3-4 servings a day  Limit red meat and sweets to only a few servings a month What are my food choices?  Mediterranean diet ? Recommended ? Grains: Whole-grain pasta. Brown rice. Bulgar wheat. Polenta. Couscous. Whole-wheat bread. [place]. ? Vegetables: Artichokes. Beets. Broccoli. Cabbage. Carrots. Eggplant. Green beans. Chard. Kale. Spinach. Onions. Leeks. Peas. Squash.  Tomatoes. Peppers. Radishes. ? Fruits: Apples. Apricots. Avocado. Berries. Bananas. Cherries. Dates. Figs. Grapes. Lemons. Melon. Oranges. Peaches. Plums. Pomegranate. ? Meats and other protein foods: Beans. Almonds. Sunflower seeds. Pine nuts. Peanuts. [place]. Salmon. Scallops. Shrimp. [place]. Tilapia. Clams. Oysters. Eggs. ? Dairy: Low-fat milk. Cheese. Greek yogurt. ? Beverages: Water. Red wine. Herbal tea. ? Fats and oils: Extra virgin olive oil. Avocado oil. Grape seed oil. ? Sweets and desserts: [place] yogurt with honey. Baked apples. Poached pears. Trail mix. ? Seasoning and other foods: Basil. Cilantro. Coriander. Cumin. Mint. Parsley. Sage. Rosemary. Tarragon. Garlic. Oregano. Thyme. Pepper. Balsalmic vinegar. Tahini. Hummus. Tomato sauce. Olives. Mushrooms. ? Limit these ? Grains: Prepackaged pasta or rice dishes. Prepackaged cereal with added sugar. ? Vegetables: Deep fried potatoes (french fries). ? Fruits: Fruit canned in syrup. ? Meats and other protein foods: Beef. Pork. Lamb. Poultry with skin. Hot dogs. Berniece Salines. ? Dairy: Ice cream. Sour cream. Whole milk. ? Beverages: Juice. Sugar-sweetened soft drinks. Beer. Liquor and spirits. ? Fats and oils: Butter. Canola oil. Vegetable oil. Beef fat (tallow). Lard. ? Sweets and desserts: Cookies. Cakes. Pies. Candy. ? Seasoning and other foods: Mayonnaise. Premade sauces and marinades. ? The items listed may not be a complete list. Talk with your dietitian about what dietary choices are right for you. Summary  The Mediterranean diet includes both food and lifestyle choices.  Eat a variety of fresh fruits and vegetables, beans, nuts, seeds, and whole grains.  Limit the amount of red meat and sweets that you eat.  Talk with your health care provider about whether it is safe for you to drink red wine in moderation. This means 1 glass a day for nonpregnant women and 2 glasses a day for men. A glass of wine equals 5 oz (150 mL). This information  is not intended to replace advice given to you by your health care provider. Make sure you discuss any questions you have with your health care provider. Document Released: 08/31/2015 Document Revised: 10/03/2015 Document Reviewed: 08/31/2015 Elsevier Interactive Patient Education  2018 [employer].   Exercising to [employer] Exercising can help you to lose weight. In order to lose weight through exercise, you need to do vigorous-intensity exercise. You can tell that you are exercising with vigorous intensity if you are breathing very hard and fast and cannot hold a conversation while exercising. Moderate-intensity exercise helps to maintain your current weight. You can tell that you are exercising at a moderate level if you have a higher heart rate and faster breathing, but you are still able to hold a conversation. How often should I exercise? Choose an activity that you enjoy and set realistic goals. Your health care provider can help you to make an activity plan that works for you. Exercise regularly as directed by your health care provider. This may include:  Doing resistance training twice each week, such as: ? Push-ups. ? Sit-ups. ? Lifting weights. ? Using resistance bands.  Doing a given intensity of exercise for a given amount of time. Choose from these options: ? 150  minutes of moderate-intensity exercise every week. ? 75 minutes of vigorous-intensity exercise every week. ? A mix of moderate-intensity and vigorous-intensity exercise every week.  Children, pregnant women, people who are out of shape, people who are overweight, and older adults may need to consult a health care provider for individual recommendations. If you have any sort of medical condition, be sure to consult your health care provider before starting a new exercise program. What are some activities that can help me to lose weight?  Walking at a rate of at least 4.5 miles an hour.  Jogging or running at a  rate of 5 miles per hour.  Biking at a rate of at least 10 miles per hour.  Lap swimming.  Roller-skating or in-line skating.  Cross-country skiing.  Vigorous competitive sports, such as football, basketball, and soccer.  Jumping rope.  Aerobic dancing. How can I be more active in my day-to-day activities?  Use the stairs instead of the elevator.  Take a walk during your lunch break.  If you drive, park your car farther away from work or school.  If you take public transportation, get off one stop early and walk the rest of the way.  Make all of your phone calls while standing up and walking around.  Get up, stretch, and walk around every 30 minutes throughout the day. What guidelines should I follow while exercising?  Do not exercise so much that you hurt yourself, feel dizzy, or get very short of breath.  Consult your health care provider prior to starting a new exercise program.  Wear comfortable clothes and shoes with good support.  Drink plenty of water while you exercise to prevent dehydration or heat stroke. Body water is lost during exercise and must be replaced.  Work out until you breathe faster and your heart beats faster. This information is not intended to replace advice given to you by your health care provider. Make sure you discuss any questions you have with your health care provider. Document Released: 02/09/2010 Document Revised: 06/15/2015 Document Reviewed: 06/10/2013 Elsevier Interactive Patient Education  2018 [employer].  Continue healthy eating, drinking plenty of water, and exercising as tolerated. Two more months of phentermine will be provided, call pharmacy when you need the second month filled. Medication refill for one month and I you would like to resume for a final three month course, please make office appt. GREAT GREAT GREAT JOB!!!! NICE TO SEE YOU!

## 2017-09-24 NOTE — Assessment & Plan Note (Signed)
LLE- posterior splint with plantarflexion in place She has f/u 10/01/2017

## 2017-10-01 DIAGNOSIS — S86112D Strain of other muscle(s) and tendon(s) of posterior muscle group at lower leg level, left leg, subsequent encounter: Secondary | ICD-10-CM | POA: Diagnosis not present

## 2017-12-11 ENCOUNTER — Other Ambulatory Visit: Payer: Self-pay | Admitting: Adult Health

## 2017-12-12 NOTE — Telephone Encounter (Signed)
[place] Controlled Substance Database reviewed.  No aberrancies noted.  Pt has had 2 previous RXs for phentermine.  Tiajuana Amass. Nelson, CMA

## 2017-12-12 NOTE — Telephone Encounter (Signed)
Please see Katy's note.  Monica Reed. Nelson, CMA

## 2017-12-12 NOTE — Telephone Encounter (Signed)
I cannot approve from home Can you please send to Dr. Sharee Holsterpalski and let her know that is it good to approve. Thanks! Orpha BurKaty

## 2018-05-21 ENCOUNTER — Other Ambulatory Visit: Payer: Self-pay | Admitting: Family Medicine

## 2018-06-08 ENCOUNTER — Other Ambulatory Visit: Payer: Self-pay | Admitting: Family Medicine

## 2018-06-09 NOTE — Telephone Encounter (Signed)
Good Afternoon Tonya, Can you please call Mr. Moreira- She has been on Phentermine for 3 months- 09/02/17, 09/29/17, 12/12/17 She needs telemedicine prior to final 3 month course Thanks! Orpha Bur

## 2018-06-10 NOTE — Progress Notes (Signed)
Virtual Visit via Telephone Note  I connected with Monica Reed on 06/11/2018 at  8:15 AM EDT by telephone and verified that I am speaking with the correct person using two identifiers.  Location: Patient:Home Provider:In Clinic   I discussed the limitations, risks, security and privacy concerns of performing an evaluation and management service by telephone and the availability of in person appointments. I also discussed with the patient that there may be a patient responsible charge related to this service. The patient expressed understanding and agreed to proceed.   History of Present Illness: 09/24/17 OV: Ms. Monica Reed is here for medical wt loss f/u: She has been on Phentermine 37.5mg  QD for four weeks and has lost 10 lbs since last OV 09/02/17 She denies CP/dyspnea/dizziness/HA/palpitations/insomnia She has eliminated all soda and sugar from diet and has increased vegetables and lean protein intake. She has been unable to exercise the last few weeks due L gastrocnemius strain r/t work injury on 09/15/17 She was treated with posterior splint with plantarflexion and Meloxicam, f/u on Sept 11, 2019 She denies current pain and only minor difficulty ambulating. 06/11/2018 OV: Ms. Monica Reed calls in today for f/u: Medical Wt Loss. She was on phentermine for three months- 09/02/2017, 09/24/2017, 12/12/2017 She has been unable to exercise due to gym closure r/t COVID-19 pandemic. She also reports increased snacking since she has been at home more often than usual, she is working reduced work hours. She has gained 7 lbs since Sept 2019 She denies tobacco/vape/ETOH use She denies CP/dyspnea/dizziness/HA/palpitations/insomnia  Patient Care Team    Relationship Specialty Notifications Start End  William Hamburgeranford, Katy D, NP PCP - General Family Medicine  06/03/17   Carrington ClampHorvath, Michelle, MD Consulting Physician Obstetrics and Gynecology  06/03/17     Patient Active Problem List   Diagnosis Date Noted  .  Strain of gastrocnemius muscle of left lower extremity 09/24/2017  . BMI 27.0-27.9,adult 09/02/2017  . Healthcare maintenance 06/03/2017  . Blood in urine 05/29/2017  . Migraine 05/29/2017  . Anxiety 12/23/2014  . Hypertension 03/26/2013     Past Medical History:  Diagnosis Date  . Frequent UTI   . Hypertension      History reviewed. No pertinent surgical history.   Family History  Problem Relation Age of Onset  . Arthritis Mother   . Cancer Father        prostate  . Heart attack Father   . Healthy Sister   . Healthy Brother   . Hypertension Daughter   . Healthy Brother   . Healthy Daughter   . Healthy Daughter      Social History   Substance and Sexual Activity  Drug Use No     Social History   Substance and Sexual Activity  Alcohol Use No     Social History   Tobacco Use  Smoking Status Never Smoker  Smokeless Tobacco Never Used     Outpatient Encounter Medications as of 06/11/2018  Medication Sig  . FLUoxetine (PROZAC) 40 MG capsule Take 1 capsule by mouth daily.  Marland Kitchen. lisinopril-hydrochlorothiazide (PRINZIDE,ZESTORETIC) 20-12.5 MG tablet Take 1 tablet by mouth daily.  Marland Kitchen. loratadine (CLARITIN) 10 MG tablet Take 10 mg by mouth daily.  . norethindrone-ethinyl estradiol (JUNEL FE 1/20) 1-20 MG-MCG tablet Take 1 tablet by mouth daily.  . phentermine (ADIPEX-P) 37.5 MG tablet TAKE 1 TABLET BY MOUTH DAILY BEFORE BREAKFAST (Patient not taking: Reported on 06/11/2018)  . [DISCONTINUED] meloxicam (MOBIC) 15 MG tablet Take 1 tablet (15 mg total) by mouth  daily.   No facility-administered encounter medications on file as of 06/11/2018.     Allergies: Cyclobenzaprine  Body mass index is 28.71 kg/m.  Blood pressure 118/72, pulse 80, temperature 98.9 F (37.2 C), temperature source Oral, height 5' (1.524 m), weight 147 lb (66.7 kg).  Review of Systems: General:   Denies fever, chills, unexplained weight loss.  Optho/Auditory:   Denies visual changes,  blurred vision/LOV Respiratory:   Denies SOB, DOE more than baseline levels.  Cardiovascular:   Denies chest pain, palpitations, new onset peripheral edema  Gastrointestinal:   Denies nausea, vomiting, diarrhea.  Genitourinary: Denies dysuria, freq/ urgency, flank pain or discharge from genitals.  Endocrine:     Denies hot or cold intolerance, polyuria, polydipsia. Musculoskeletal:   Denies unexplained myalgias, joint swelling, unexplained arthralgias, gait problems.  Skin:  Denies rash, suspicious lesions Neurological:     Denies dizziness, unexplained weakness, numbness  Psychiatric/Behavioral:   Denies mood changes, suicidal or homicidal ideations, hallucinations This patient does not have sx concerning for COVID-19 Infection (ie; fever, chills, cough, new or worsening shortness of breath). Observations/Objective: No acute distress noted during the telephone conversation.  Assessment and Plan: [facility] Controlled Substance Database reviewed- no aberrancies noted Final 3 month course of phentermine- rx sent in Remain well hydrated, follow Mediterranean diet. Increase regular exercise.  Recommend at least 30 minutes daily, 5 days per week of walking, jogging, biking, swimming, YouTube/Pinterest workout videos. COVID-19 Education: Signs and symptoms of COVID-19 infection were discussed with pt and how to seek care for testing.  The importance of following the Stay at Home order, and when out- Social Distancing and wearing a facial mask were discussed today. Follow Up Instructions: 3 month CPE, fasting labs the week prior.   I discussed the assessment and treatment plan with the patient. The patient was provided an opportunity to ask questions and all were answered. The patient agreed with the plan and demonstrated an understanding of the instructions.   The patient was advised to call back or seek an in-person evaluation if the symptoms worsen or if the condition fails to improve as  anticipated.  I provided  8 minutes of non-face-to-face time during this encounter.   Julaine Fusi, NP

## 2018-06-10 NOTE — Telephone Encounter (Signed)
Pt informed.  Pt expressed understanding and is agreeable and was transferred to front desk to schedule appt.  T. Nelson, CMA  

## 2018-06-11 ENCOUNTER — Other Ambulatory Visit: Payer: Self-pay

## 2018-06-11 ENCOUNTER — Encounter: Payer: Self-pay | Admitting: Adult Health

## 2018-06-11 ENCOUNTER — Ambulatory Visit (INDEPENDENT_AMBULATORY_CARE_PROVIDER_SITE_OTHER): Payer: BLUE CROSS/BLUE SHIELD | Admitting: Adult Health

## 2018-06-11 VITALS — BP 118/72 | HR 80 | Temp 98.9°F | Ht 60.0 in | Wt 147.0 lb

## 2018-06-11 DIAGNOSIS — Z0000 Encounter for general adult medical examination without abnormal findings: Secondary | ICD-10-CM | POA: Diagnosis not present

## 2018-06-11 DIAGNOSIS — I10 Essential (primary) hypertension: Secondary | ICD-10-CM

## 2018-06-11 DIAGNOSIS — Z6828 Body mass index (BMI) 28.0-28.9, adult: Secondary | ICD-10-CM | POA: Diagnosis not present

## 2018-06-11 MED ORDER — PHENTERMINE HCL 37.5 MG PO TABS: ORAL_TABLET | ORAL | 0 refills | Status: DC

## 2018-06-11 NOTE — Assessment & Plan Note (Signed)
>>  ASSESSMENT AND PLAN FOR BMI 26.0-26.9,ADULT WRITTEN ON 06/11/2018  8:27 AM BY DANFORD, KATY D, NP  Assessment and Plan: [facility] Controlled Substance Database reviewed- no aberrancies noted Final 3 month course of phentermine- rx sent in Remain well hydrated, follow Mediterranean diet. Increase regular exercise.  Recommend at least 30 minutes daily, 5 days per week of walking, jogging, biking, swimming, YouTube/Pinterest workout videos. COVID-19 Education: Signs and symptoms of COVID-19 infection were discussed with pt and how to seek care for testing.  The importance of following the Stay at Home order, and when out- Social Distancing and wearing a facial mask were discussed today. Follow Up Instructions: 3 month CPE, fasting labs the week prior.   I discussed the assessment and treatment plan with the patient. The patient was provided an opportunity to ask questions and all were answered. The patient agreed with the plan and demonstrated an understanding of the instructions.   The patient was advised to call back or seek an in-person evaluation if the symptoms worsen or if the condition fails to improve as anticipated.

## 2018-06-11 NOTE — Assessment & Plan Note (Signed)
Assessment and Plan: [facility] Controlled Substance Database reviewed- no aberrancies noted Final 3 month course of phentermine- rx sent in Remain well hydrated, follow Mediterranean diet. Increase regular exercise.  Recommend at least 30 minutes daily, 5 days per week of walking, jogging, biking, swimming, YouTube/Pinterest workout videos. COVID-19 Education: Signs and symptoms of COVID-19 infection were discussed with pt and how to seek care for testing.  The importance of following the Stay at Home order, and when out- Social Distancing and wearing a facial mask were discussed today. Follow Up Instructions: 3 month CPE, fasting labs the week prior.   I discussed the assessment and treatment plan with the patient. The patient was provided an opportunity to ask questions and all were answered. The patient agreed with the plan and demonstrated an understanding of the instructions.   The patient was advised to call back or seek an in-person evaluation if the symptoms worsen or if the condition fails to improve as anticipated.

## 2018-06-11 NOTE — Assessment & Plan Note (Signed)
BP at goal 118/72, HR 80 Continue Lisinopril/HCTZ 20/12.5mg  QD

## 2018-06-22 NOTE — Progress Notes (Signed)
Subjective:    Patient ID: Monica Reed, female    DOB: 05-20-1970, 48 y.o.   MRN: [id number]  HPI:  Monica Reed presents with development of a mass on R FA, she reports noticing mass 3 days ago. She denies any pain, but states "If I keep my arm hanging down the bump begins to feel warm". She denies acute trauma or injury to site. She denies itching at the site. 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 She denies hx of lipomas or any other similar masses She denies fever/night sweats/N/V/D/malaise/unexplained weight loss Only first degree ca hx is father- prostate ca Patient Care Team    Relationship Specialty Notifications Start End  William Hamburger D, NP PCP - General Family Medicine  06/03/17   Carrington Clamp, MD Consulting Physician Obstetrics and Gynecology  06/03/17     Patient Active Problem List   Diagnosis Date Noted  . Mass of arm, right 06/23/2018  . Strain of gastrocnemius muscle of left lower extremity 09/24/2017  . BMI 28.0-28.9,adult 09/02/2017  . Healthcare maintenance 06/03/2017  . Migraine 05/29/2017  . Anxiety 12/23/2014  . Hypertension 03/26/2013     Past Medical History:  Diagnosis Date  . Frequent UTI   . Hypertension      History reviewed. No pertinent surgical history.   Family History  Problem Relation Age of Onset  . Arthritis Mother   . Cancer Father        prostate  . Heart attack Father   . Healthy Sister   . Healthy Brother   . Hypertension Daughter   . Healthy Brother   . Healthy Daughter   . Healthy Daughter      Social History   Substance and Sexual Activity  Drug Use No     Social History   Substance and Sexual Activity  Alcohol Use No     Social History   Tobacco Use  Smoking Status Never Smoker  Smokeless Tobacco Never Used     Outpatient Encounter Medications as of 06/23/2018  Medication Sig  . FLUoxetine (PROZAC) 40 MG capsule Take 1  capsule by mouth daily.  Marland Kitchen lisinopril-hydrochlorothiazide (PRINZIDE,ZESTORETIC) 20-12.5 MG tablet Take 1 tablet by mouth daily.  Marland Kitchen loratadine (CLARITIN) 10 MG tablet Take 10 mg by mouth daily.  . norethindrone-ethinyl estradiol (JUNEL FE 1/20) 1-20 MG-MCG tablet Take 1 tablet by mouth daily.  . phentermine (ADIPEX-P) 37.5 MG tablet TAKE 1 TABLET BY MOUTH DAILY BEFORE BREAKFAST   No facility-administered encounter medications on file as of 06/23/2018.     Allergies: Cyclobenzaprine  Body mass index is 28.42 kg/m.  Blood pressure 129/82, pulse 78, temperature 99 F (37.2 C), temperature source Oral, height 5' (1.524 m), weight 145 lb 8 oz (66 kg), SpO2 99 %.  Review of Systems  Constitutional: Positive for fatigue. Negative for activity change, appetite change, chills, diaphoresis, fever and unexpected weight change.  HENT: Negative for congestion.   Eyes: Negative for visual disturbance.  Respiratory: Negative for cough, chest tightness, shortness of breath, wheezing and stridor.   Cardiovascular: Negative for chest pain, palpitations and leg swelling.  Gastrointestinal: Negative for abdominal distention, abdominal pain, blood in stool, constipation, diarrhea, nausea, rectal pain and vomiting.  Endocrine: Negative for cold intolerance, heat intolerance, polydipsia, polyphagia and polyuria.  Genitourinary: Negative for difficulty urinating.  Skin:       New mass  Neurological: Negative for dizziness and headaches.  Hematological: Negative for adenopathy. Does not bruise/bleed easily.  Psychiatric/Behavioral: Negative for agitation, behavioral problems, confusion, decreased concentration, dysphoric mood, hallucinations, self-injury, sleep disturbance and suicidal ideas. The patient is not nervous/anxious and is not hyperactive.        Objective:   Physical Exam Vitals signs and nursing note reviewed.  Constitutional:      General: She is not in acute distress.    Appearance: She is  not ill-appearing, toxic-appearing or diaphoretic.  HENT:     Head: Normocephalic and atraumatic.  Eyes:     Extraocular Movements: Extraocular movements intact.     Conjunctiva/sclera: Conjunctivae normal.     Pupils: Pupils are equal, round, and reactive to light.  Cardiovascular:     Rate and Rhythm: Normal rate.     Pulses: Normal pulses.     Heart sounds: Normal heart sounds. No murmur. No friction rub. No gallop.   Pulmonary:     Effort: Pulmonary effort is normal. No respiratory distress.     Breath sounds: Normal breath sounds. No stridor. No wheezing, rhonchi or rales.  Chest:     Chest wall: No tenderness.  Musculoskeletal:     Right forearm: She exhibits no tenderness, no bony tenderness and no swelling.       Arms:     Comments: RUE: Mass noted distal to Olecranon process 4.5cm x 2.5cm mobile, soft mass, non-tender. No warmth or redness appreciated. No open tissue or drainage noted   Skin:    General: Skin is warm and dry.     Capillary Refill: Capillary refill takes less than 2 seconds.  Neurological:     Mental Status: She is alert and oriented to person, place, and time.  Psychiatric:        Mood and Affect: Mood normal.        Thought Content: Thought content normal.        Judgment: Judgment normal.        Assessment & Plan:   1. Mass of arm, right     Mass of arm, right Likely lipoma  Imaging order placed, someone will call you to schedule. We will call you with the results as soon as they are available. Continue social distance and wear a mask when out in public.    FOLLOW-UP:  Return if symptoms worsen or fail to improve.

## 2018-06-23 ENCOUNTER — Other Ambulatory Visit: Payer: Self-pay

## 2018-06-23 ENCOUNTER — Encounter: Payer: Self-pay | Admitting: Adult Health

## 2018-06-23 ENCOUNTER — Ambulatory Visit: Payer: BC Managed Care – PPO | Admitting: Adult Health

## 2018-06-23 VITALS — BP 129/82 | HR 78 | Temp 99.0°F | Ht 60.0 in | Wt 145.5 lb

## 2018-06-23 DIAGNOSIS — R2231 Localized swelling, mass and lump, right upper limb: Secondary | ICD-10-CM | POA: Insufficient documentation

## 2018-06-23 NOTE — Patient Instructions (Signed)
Lipoma  A lipoma is a noncancerous (benign) tumor that is made up of fat cells. This is a very common type of soft-tissue growth. Lipomas are usually found under the skin (subcutaneous). They may occur in any tissue of the body that contains fat. Common areas for lipomas to appear include the back, shoulders, buttocks, and thighs.  Lipomas grow slowly, and they are usually painless. Most lipomas do not cause problems and do not require treatment. What are the causes? The cause of this condition is not known. What increases the risk? You are more likely to develop this condition if:  You are 8-19 years old.  You have a family history of lipomas. What are the signs or symptoms? A lipoma usually appears as a small, round bump under the skin. In most cases, the lump will:  Feel soft or rubbery.  Not cause pain or other symptoms. However, if a lipoma is located in an area where it pushes on nerves, it can become painful or cause other symptoms. How is this diagnosed? A lipoma can usually be diagnosed with a physical exam. You may also have tests to confirm the diagnosis and to rule out other conditions. Tests may include:  Imaging tests, such as a CT scan or MRI.  Removal of a tissue sample to be looked at under a microscope (biopsy). How is this treated? Treatment for this condition depends on the size of the lipoma and whether it is causing any symptoms.  For small lipomas that are not causing problems, no treatment is needed.  If a lipoma is bigger or it causes problems, surgery may be done to remove the lipoma. Lipomas can also be removed to improve appearance. Most often, the procedure is done after applying a medicine that numbs the area (local anesthetic). Follow these instructions at home:  Watch your lipoma for any changes.  Keep all follow-up visits as told by your health care provider. This is important. Contact a health care provider if:  Your lipoma becomes larger or  hard.  Your lipoma becomes painful, red, or increasingly swollen. These could be signs of infection or a more serious condition. Get help right away if:  You develop tingling or numbness in an area near the lipoma. This could indicate that the lipoma is causing nerve damage. Summary  A lipoma is a noncancerous tumor that is made up of fat cells.  Most lipomas do not cause problems and do not require treatment.  If a lipoma is bigger or it causes problems, surgery may be done to remove the lipoma. This information is not intended to replace advice given to you by your health care provider. Make sure you discuss any questions you have with your health care provider. Document Released: 12/28/2001 Document Revised: 12/24/2016 Document Reviewed: 12/24/2016 Elsevier Interactive Patient Education  2019 [employer].  Imaging order placed, someone will call you to schedule. We will call you with the results as soon as they are available. Continue social distance and wear a mask when out in public. NICE TO SEE YOU!

## 2018-06-23 NOTE — Assessment & Plan Note (Signed)
Likely lipoma  Imaging order placed, someone will call you to schedule. We will call you with the results as soon as they are available. Continue social distance and wear a mask when out in public.

## 2018-06-26 ENCOUNTER — Ambulatory Visit
Admission: RE | Admit: 2018-06-26 | Discharge: 2018-06-26 | Disposition: A | Discharge status: Home / Self Care | Payer: BC Managed Care – PPO | Source: Ambulatory Visit | Attending: Adult Health | Admitting: Adult Health

## 2018-06-26 DIAGNOSIS — R2231 Localized swelling, mass and lump, right upper limb: Secondary | ICD-10-CM

## 2018-06-26 DIAGNOSIS — D1723 Benign lipomatous neoplasm of skin and subcutaneous tissue of right leg: Secondary | ICD-10-CM | POA: Diagnosis not present

## 2018-06-26 IMAGING — US RIGHT LOWER EXTREMITY SOFT TISSUE ULTRASOUND LIMITED
1 series · 13 of 13 positions shown · non-contrast
Comparison: None.

CLINICAL DATA: Right proximal forearm palpable mass.

EXAM:
ULTRASOUND RIGHT LOWER EXTREMITY LIMITED
TECHNIQUE: Ultrasound examination of the lower extremity soft tissues was
performed in the area of clinical concern.

[Series 1: right lower extremity soft tissue ultrasound limit · 0.05mm/px · 13 acquisitions, 13 frames shown]
[im 1/13]
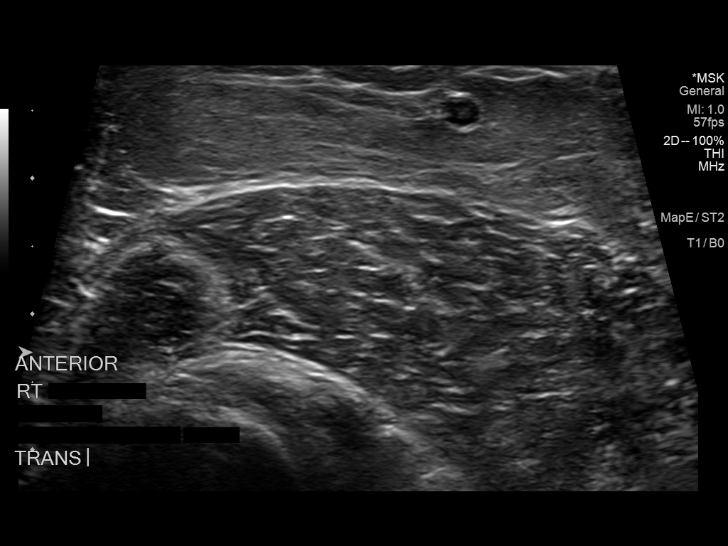
[im 2/13]
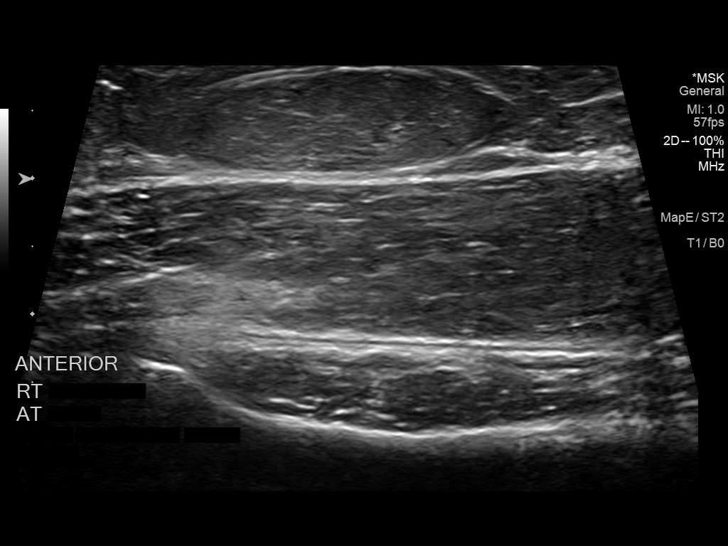
[im 3/13]
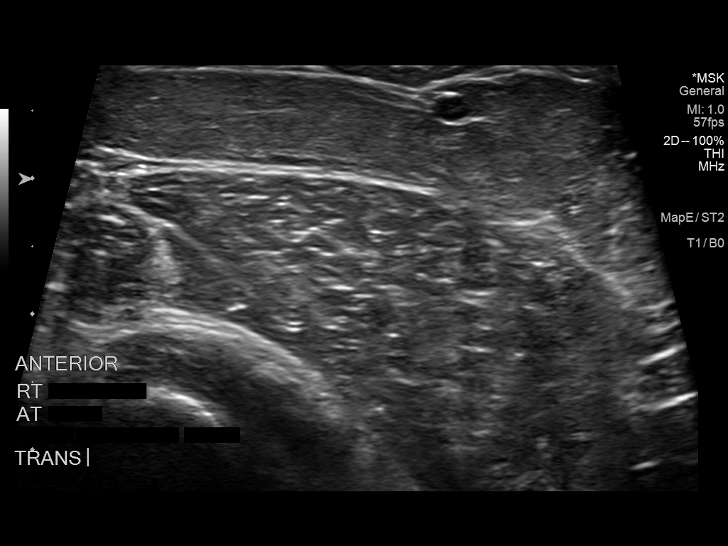
[im 4/13]
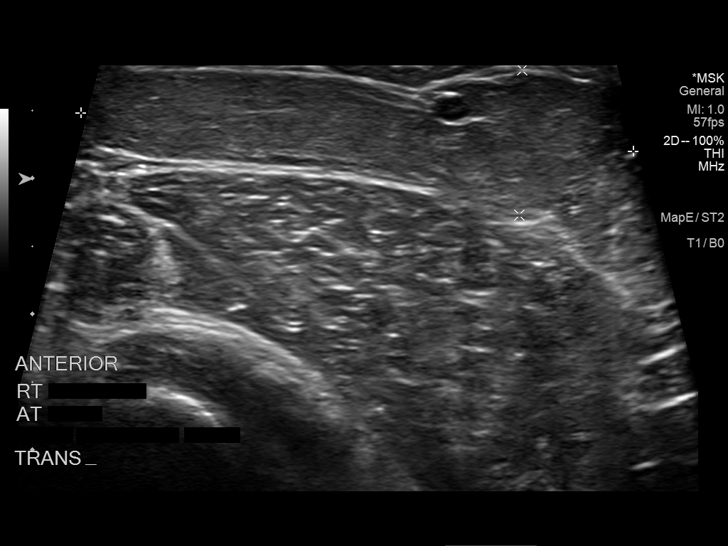
[im 5/13]
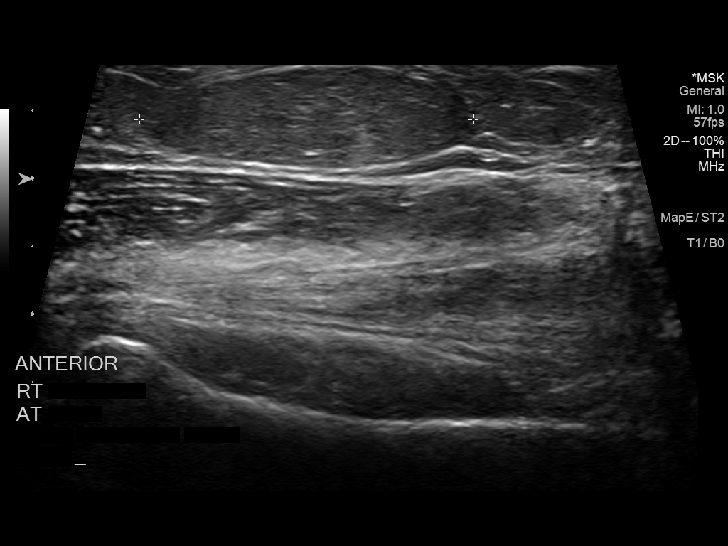
[im 6/13]
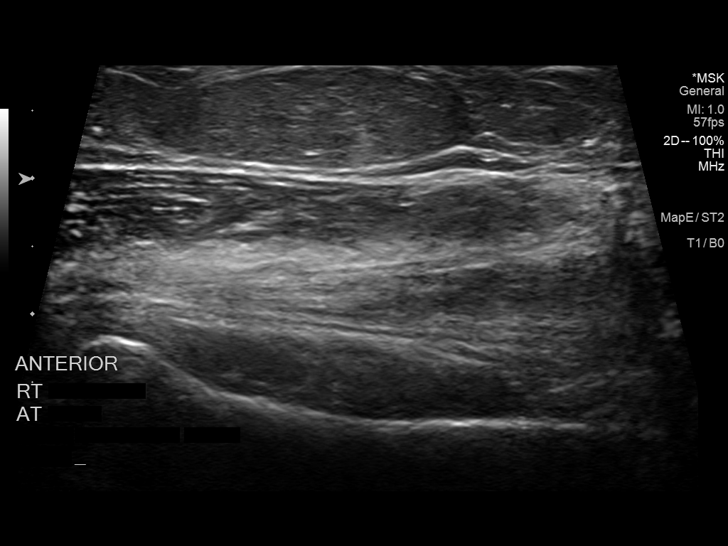
[im 7/13]
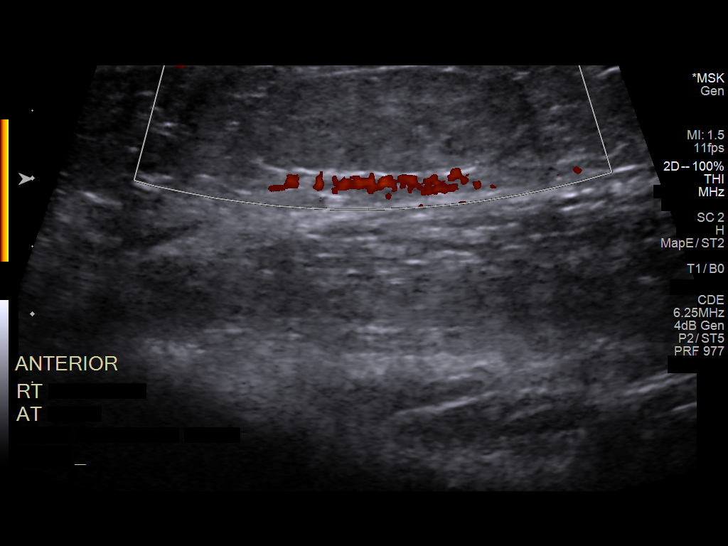
[im 8/13]
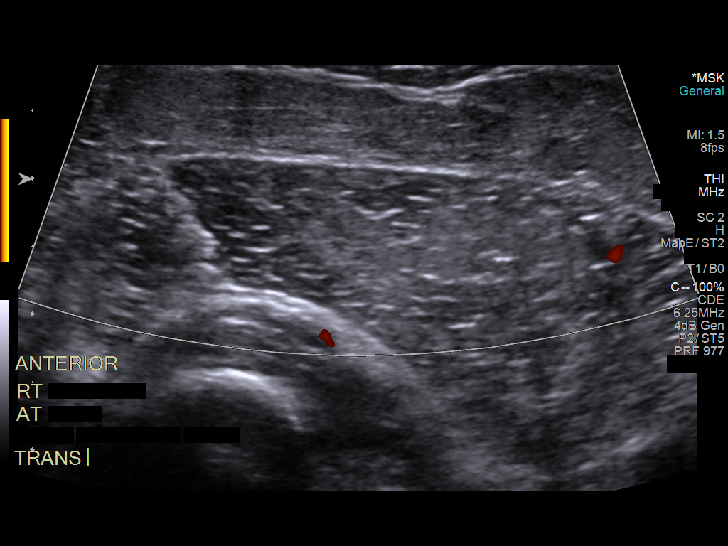
[im 9/13]
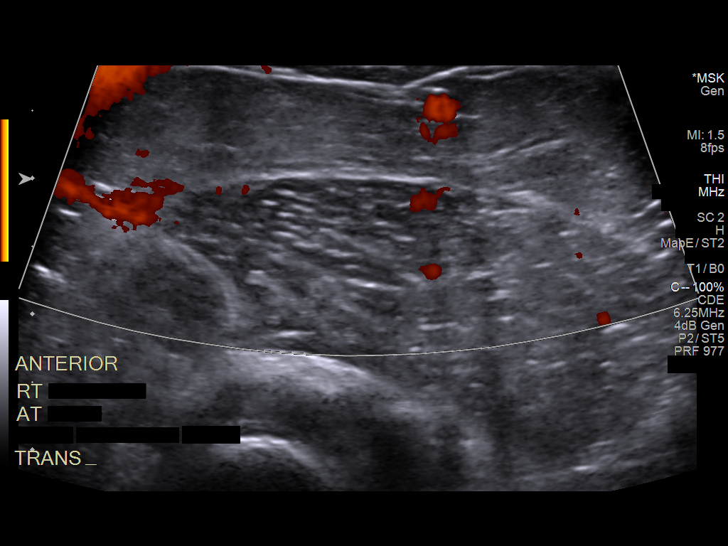
[im 10/13]
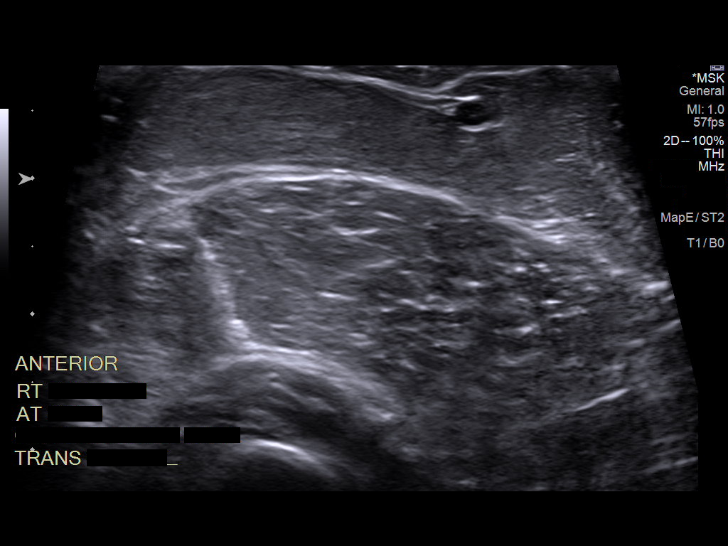
[im 11/13]
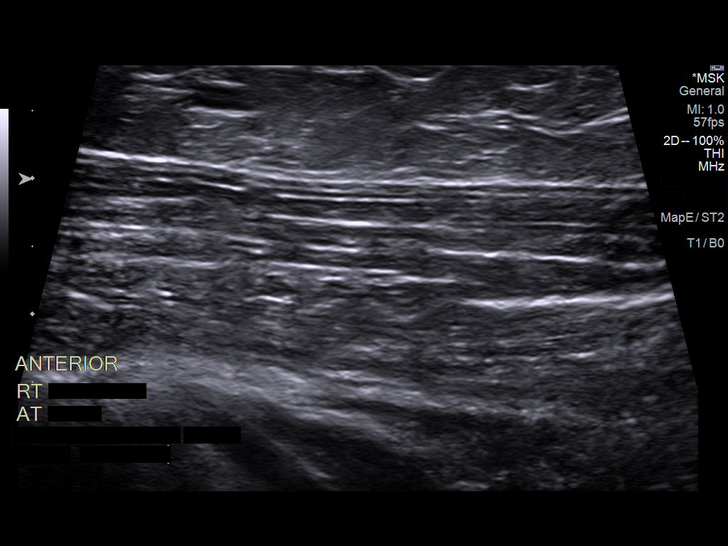
[im 12/13]
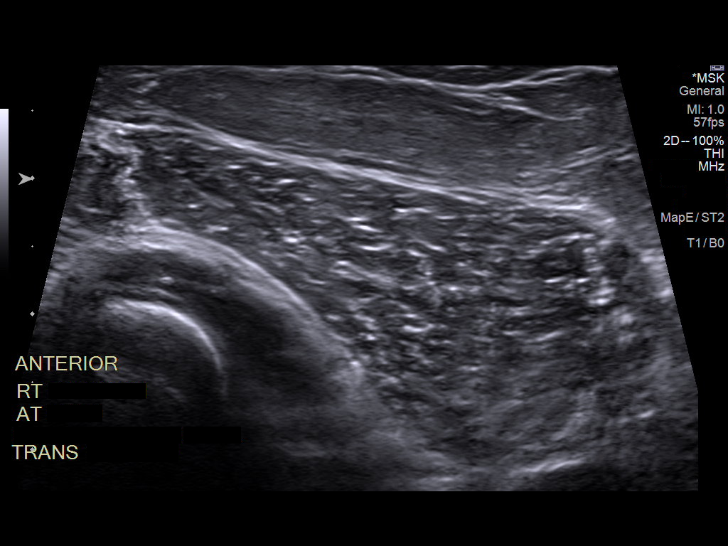
[im 13/13]
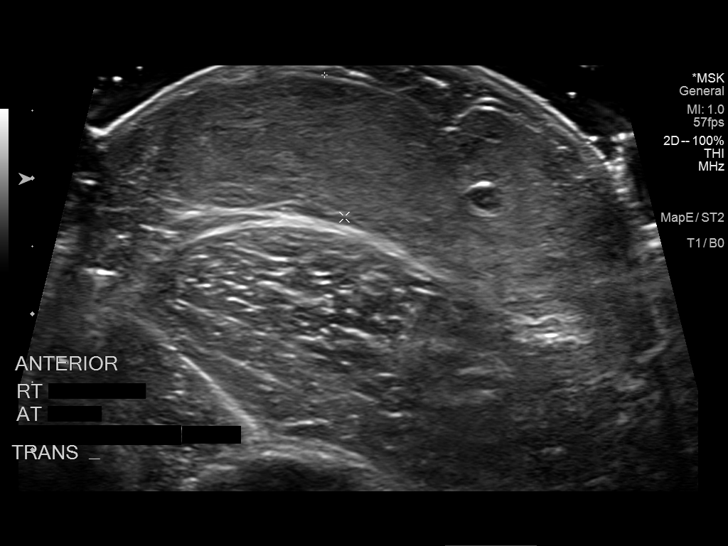

[13 of 13 positions shown; findings below may reference images not displayed]

FINDINGS: Focused ultrasound of the proximal right forearm at the palpable
abnormality demonstrates a superficial 2.5 x 4.1 x 1.1 cm
well-defined isoechoic mass without internal vascularity.
IMPRESSION: 1. Palpable abnormality corresponds to a 4.1 cm simple lipoma.

## 2018-07-08 ENCOUNTER — Other Ambulatory Visit: Payer: Self-pay | Admitting: Adult Health

## 2018-07-08 NOTE — Telephone Encounter (Signed)
LOV for chronic care was 06/11/2018.  Medication last filled on 06/11/2018 for # 30 no refills.  Please review and advise. MPulliam, CMA/RT(R)

## 2018-07-10 ENCOUNTER — Other Ambulatory Visit: Payer: Self-pay | Admitting: Adult Health

## 2018-08-07 DIAGNOSIS — Z1231 Encounter for screening mammogram for malignant neoplasm of breast: Secondary | ICD-10-CM | POA: Diagnosis not present

## 2018-08-07 DIAGNOSIS — Z01419 Encounter for gynecological examination (general) (routine) without abnormal findings: Secondary | ICD-10-CM | POA: Diagnosis not present

## 2018-08-07 DIAGNOSIS — Z6827 Body mass index (BMI) 27.0-27.9, adult: Secondary | ICD-10-CM | POA: Diagnosis not present

## 2018-08-07 DIAGNOSIS — Z124 Encounter for screening for malignant neoplasm of cervix: Secondary | ICD-10-CM | POA: Diagnosis not present

## 2018-08-07 LAB — HM PAP SMEAR

## 2018-08-10 ENCOUNTER — Other Ambulatory Visit: Payer: Self-pay | Admitting: Obstetrics and Gynecology

## 2018-08-10 DIAGNOSIS — R928 Other abnormal and inconclusive findings on diagnostic imaging of breast: Secondary | ICD-10-CM

## 2018-08-12 ENCOUNTER — Ambulatory Visit
Admission: RE | Admit: 2018-08-12 | Discharge: 2018-08-12 | Disposition: A | Discharge status: Home / Self Care | Payer: BC Managed Care – PPO | Source: Ambulatory Visit | Attending: Obstetrics and Gynecology | Admitting: Obstetrics and Gynecology

## 2018-08-12 ENCOUNTER — Other Ambulatory Visit: Payer: Self-pay | Admitting: Obstetrics and Gynecology

## 2018-08-12 ENCOUNTER — Other Ambulatory Visit: Payer: Self-pay

## 2018-08-12 DIAGNOSIS — N6002 Solitary cyst of left breast: Secondary | ICD-10-CM

## 2018-08-12 DIAGNOSIS — R928 Other abnormal and inconclusive findings on diagnostic imaging of breast: Secondary | ICD-10-CM

## 2018-08-12 DIAGNOSIS — N6012 Diffuse cystic mastopathy of left breast: Secondary | ICD-10-CM | POA: Diagnosis not present

## 2018-08-12 IMAGING — US ULTRASOUND LEFT BREAST LIMITED
1 series · 8 of 8 positions shown · non-contrast
Comparison: Previous exam(s).

CLINICAL DATA: Patient was called back from screening mammogram for
a possible mass in the left breast.

EXAM:
DIGITAL DIAGNOSTIC LEFT MAMMOGRAM WITH TOMO
ULTRASOUND LEFT BREAST

[Series 1: ultrasound left breast limited · 0.06mm/px · 8 of 8 slices shown]
[im 1/8]
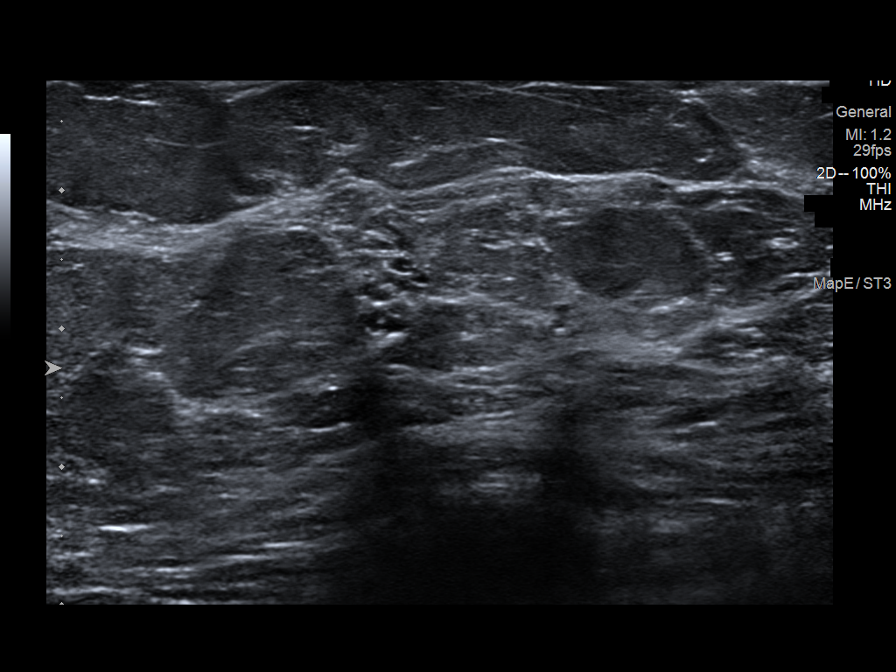
[im 2/8]
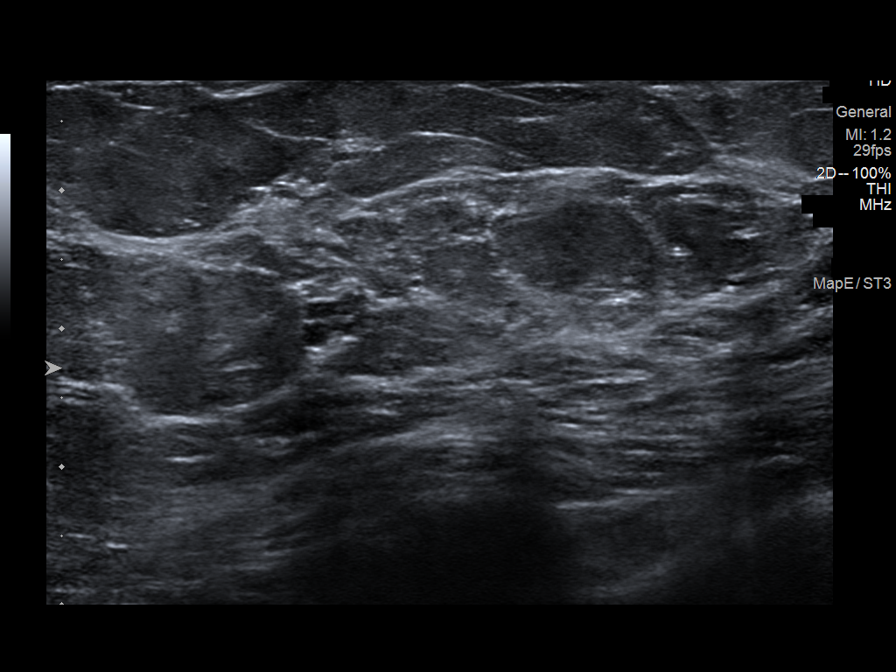
[im 3/8]
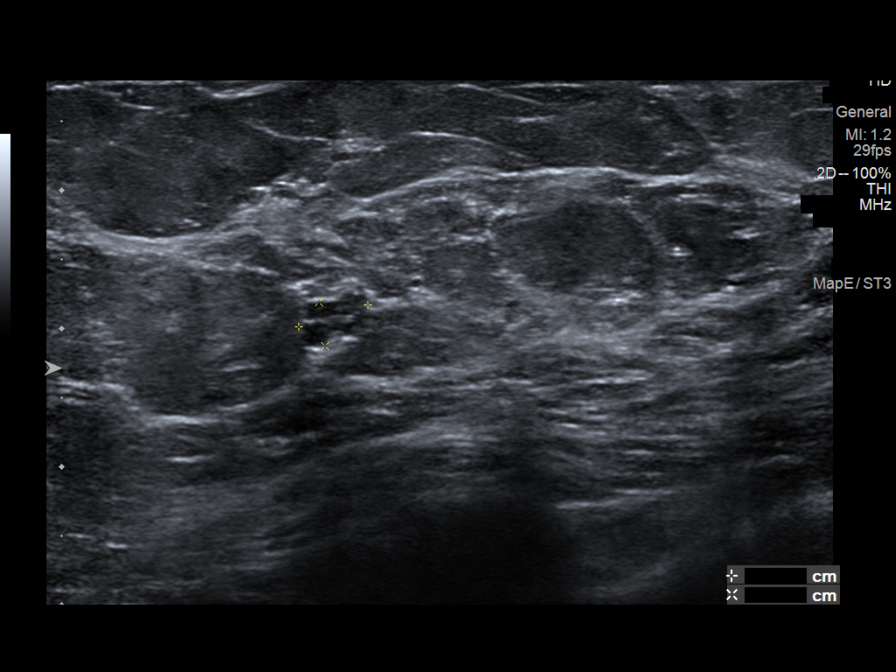
[im 4/8]
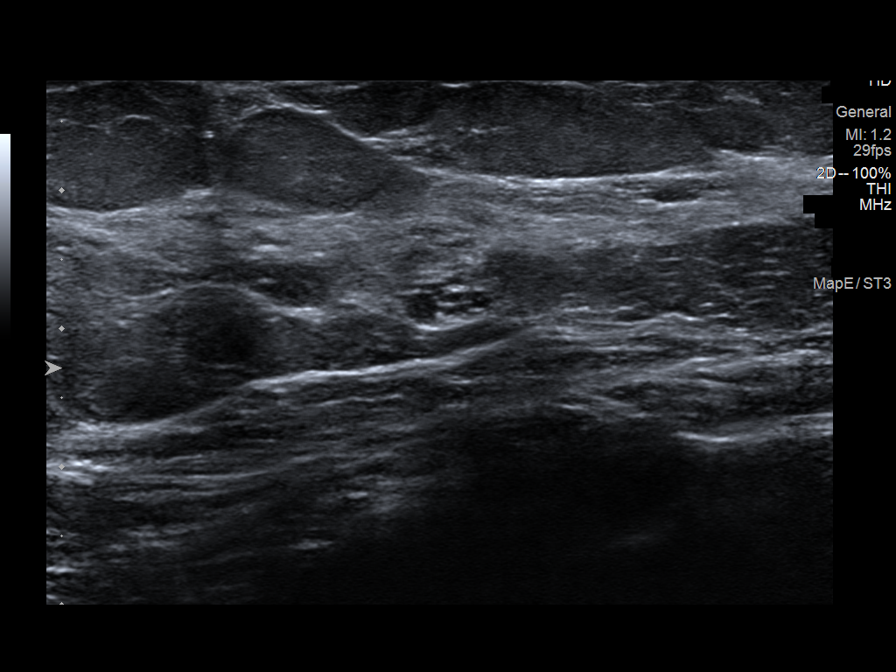
[im 5/8]
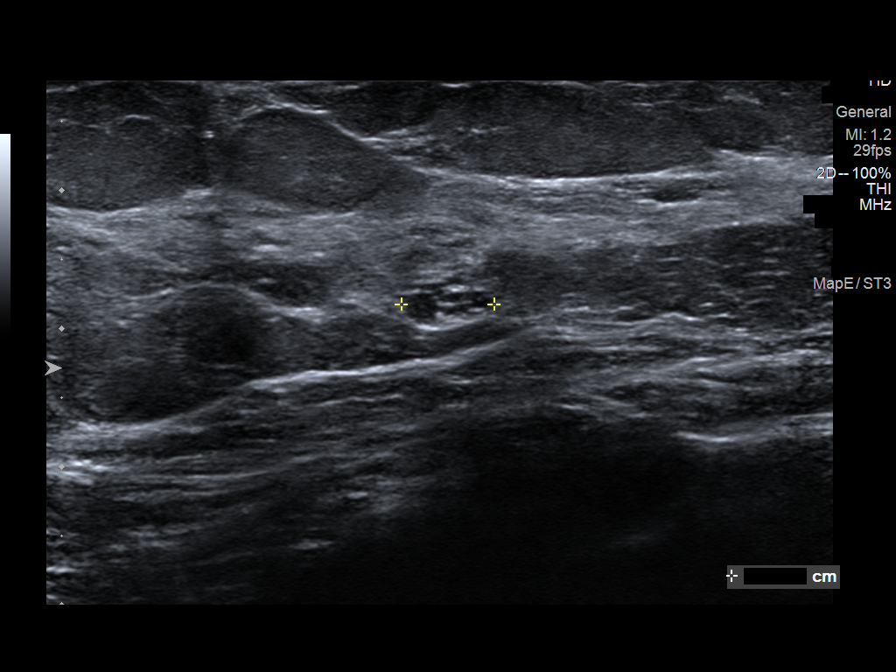
[im 6/8]
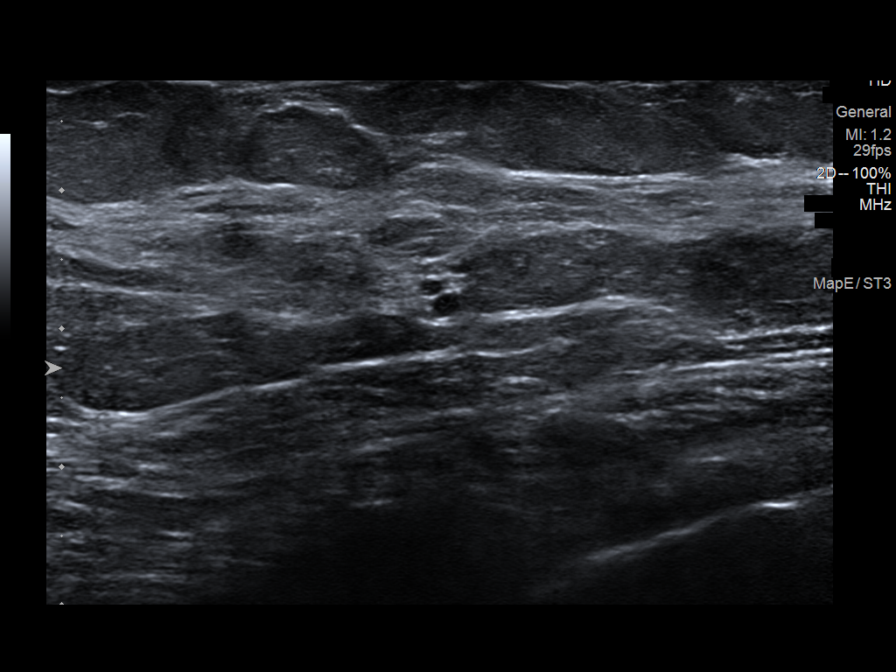
[im 7/8]
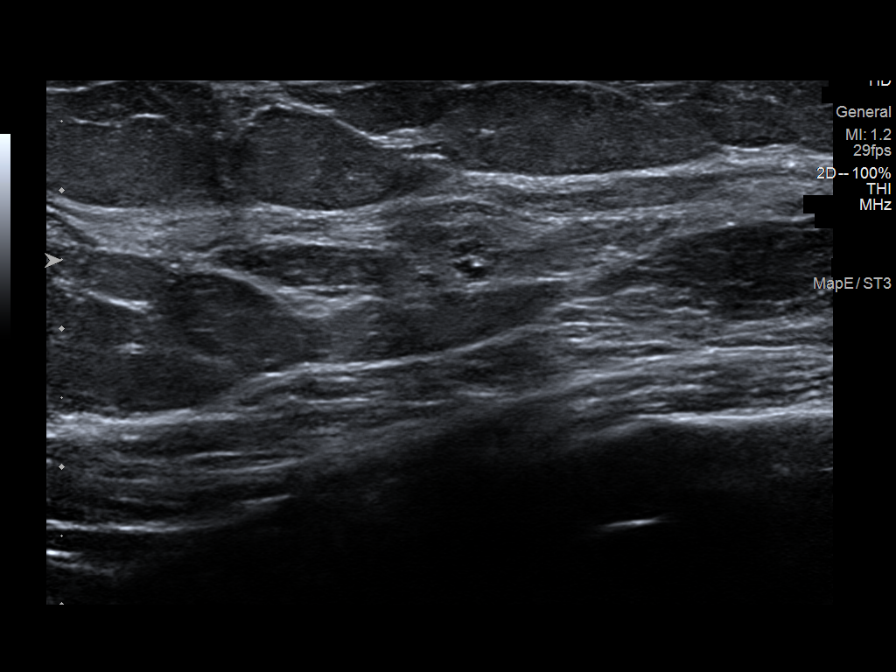
[im 8/8]
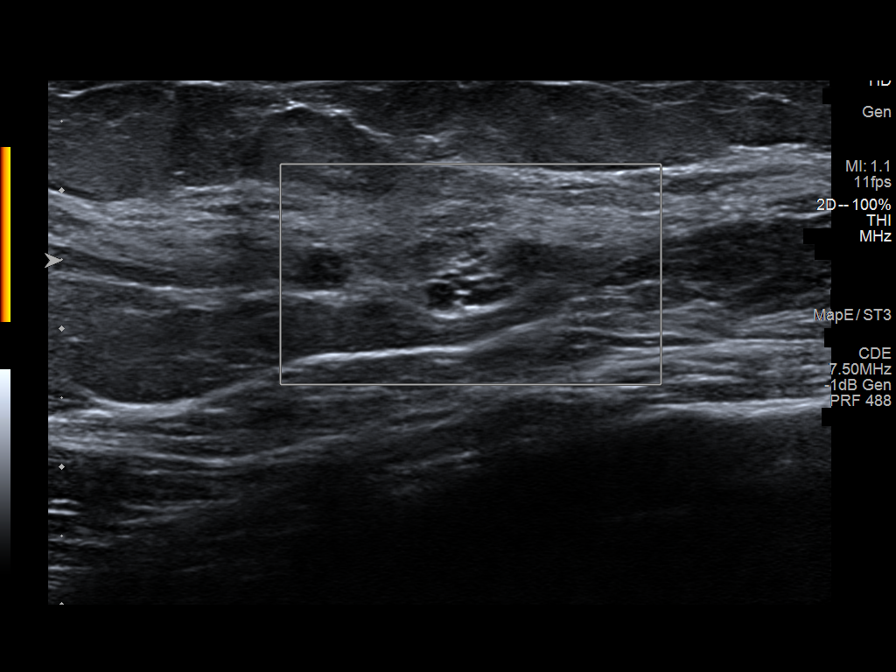

[8 of 8 positions shown; findings below may reference images not displayed]

ACR Breast Density Category b: There are scattered areas of
fibroglandular density.
FINDINGS: Additional imaging of the left breast was performed. There is
persistence of a 7 mm mass in the lateral aspect of the breast.
There are no malignant type microcalcifications.

Targeted ultrasound is performed, showing a probable cluster of
cysts (apocrine metaplasia) in the left breast at 2 o'clock 4 cm
from the nipple measuring 5 x 3 x 7 mm.
IMPRESSION: Probable benign apocrine metaplasia in the left breast.

RECOMMENDATION:
Short-term interval follow-up left breast ultrasound in 6 months is
recommended.

I have discussed the findings and recommendations with the patient.
Results were also provided in writing at the conclusion of the
visit. If applicable, a reminder letter will be sent to the patient
regarding the next appointment.

BI-RADS CATEGORY  3: Probably benign.

## 2018-08-12 IMAGING — MG DIGITAL DIAGNOSTIC UNILATERAL LEFT MAMMOGRAM WITH TOMO AND CAD
8 series · 9 of 24 positions shown · non-contrast
Comparison: Previous exam(s).

CLINICAL DATA: Patient was called back from screening mammogram for
a possible mass in the left breast.

EXAM:
DIGITAL DIAGNOSTIC LEFT MAMMOGRAM WITH TOMO
ULTRASOUND LEFT BREAST

[L MLO synth-2D (1 of 2)]
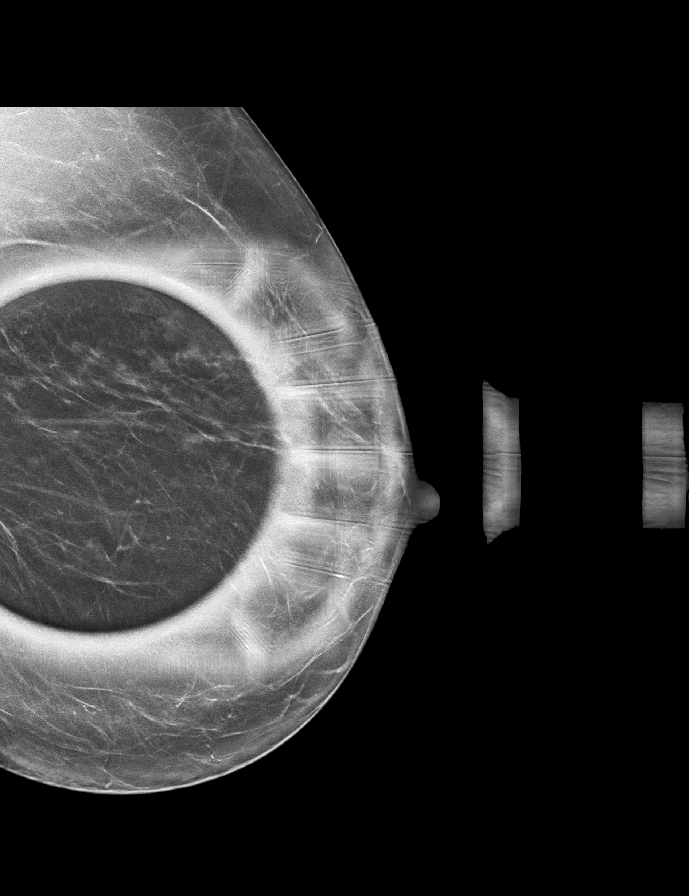

[R CC synth-2D]
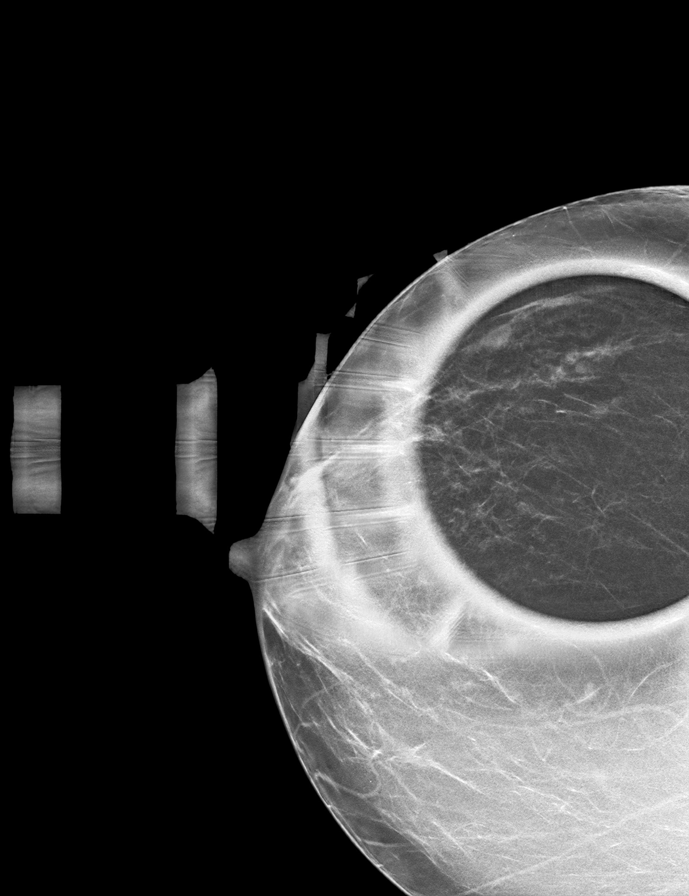

[L MLO synth-2D (2 of 2)]
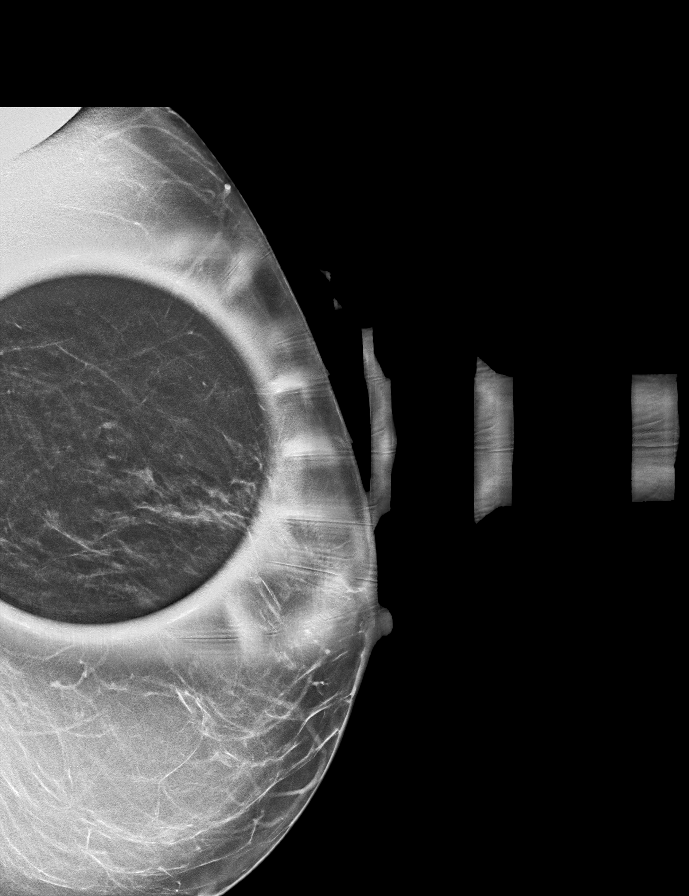

[L CC synth-2D]
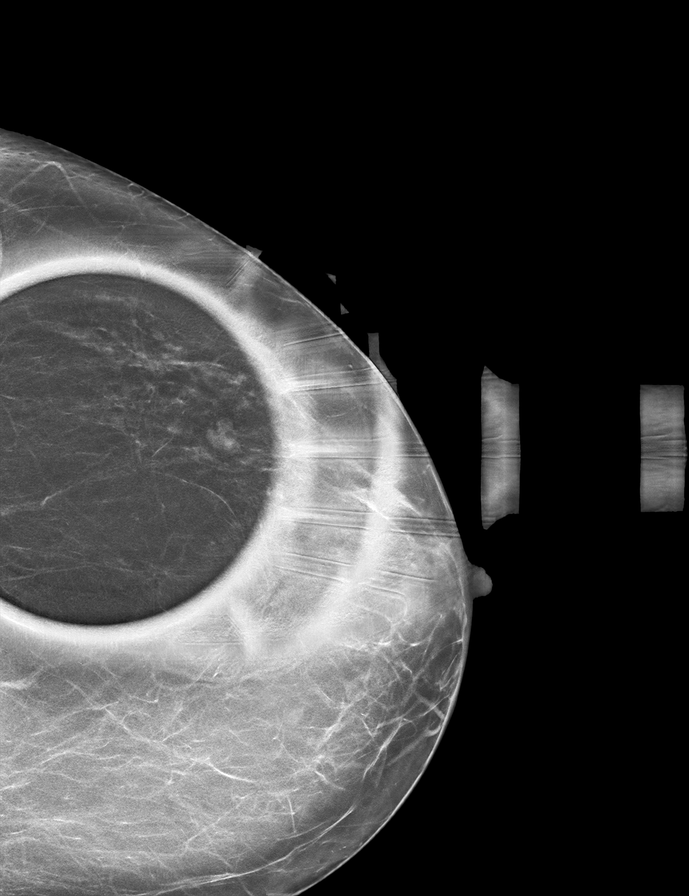

[L MLO tomo · 2 of 53 frames shown (1 of 2)]
[frame 18/53]
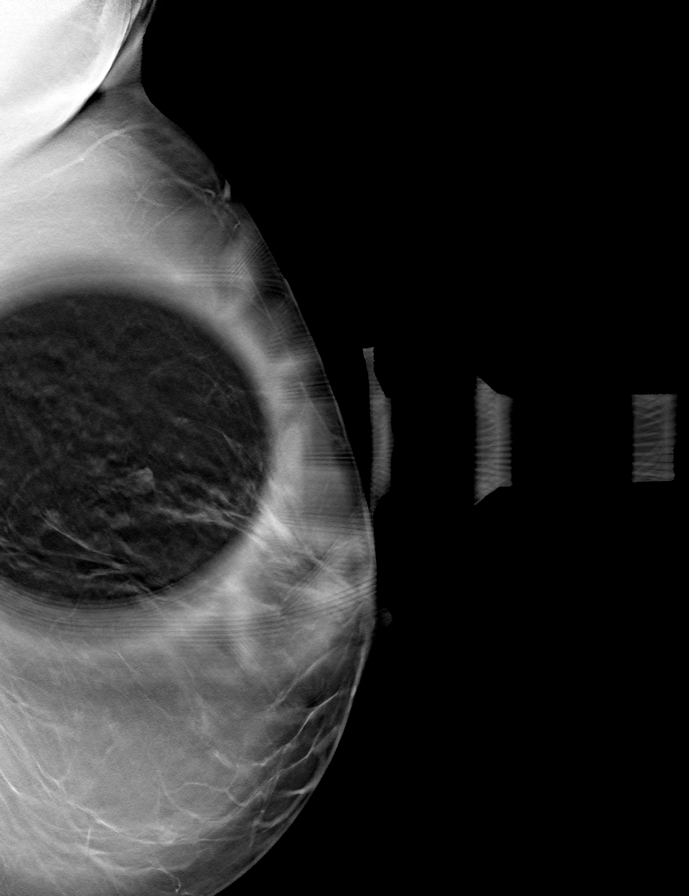
[frame 27/53]
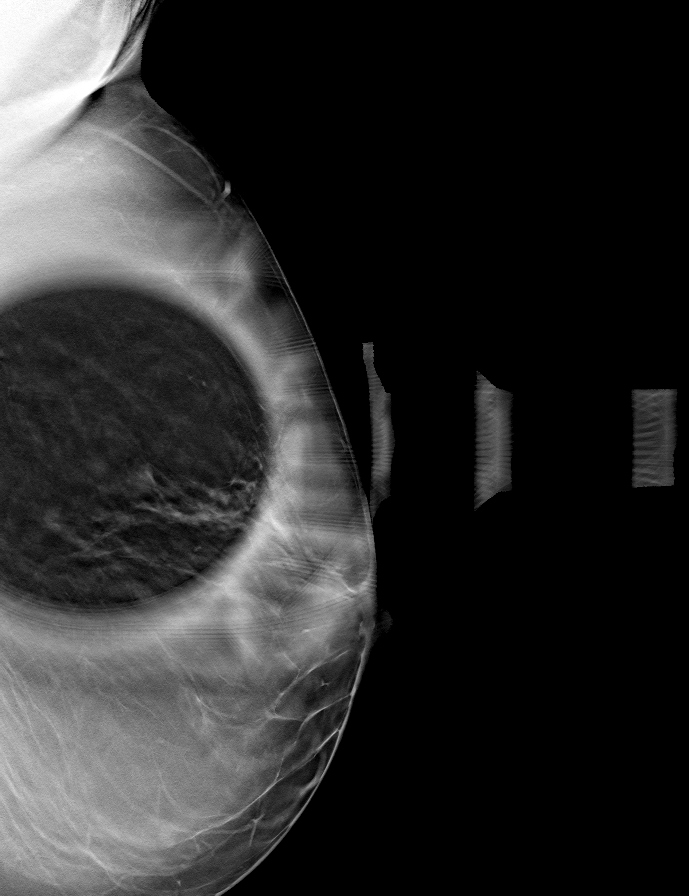

[L MLO tomo (2 of 2) · tomo slice 30/59.0]
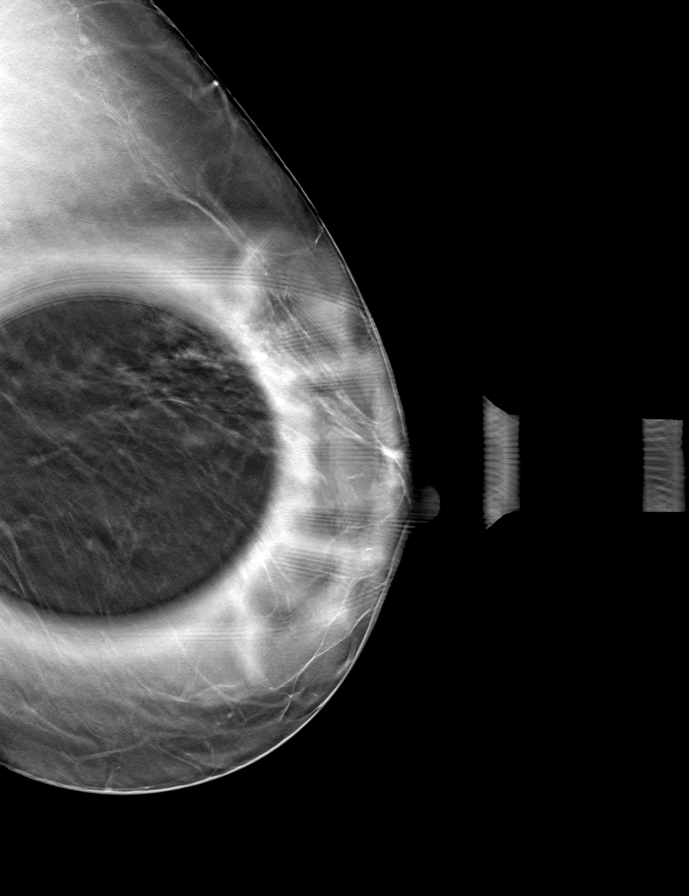

[L CC tomo · tomo slice 29/58.0]
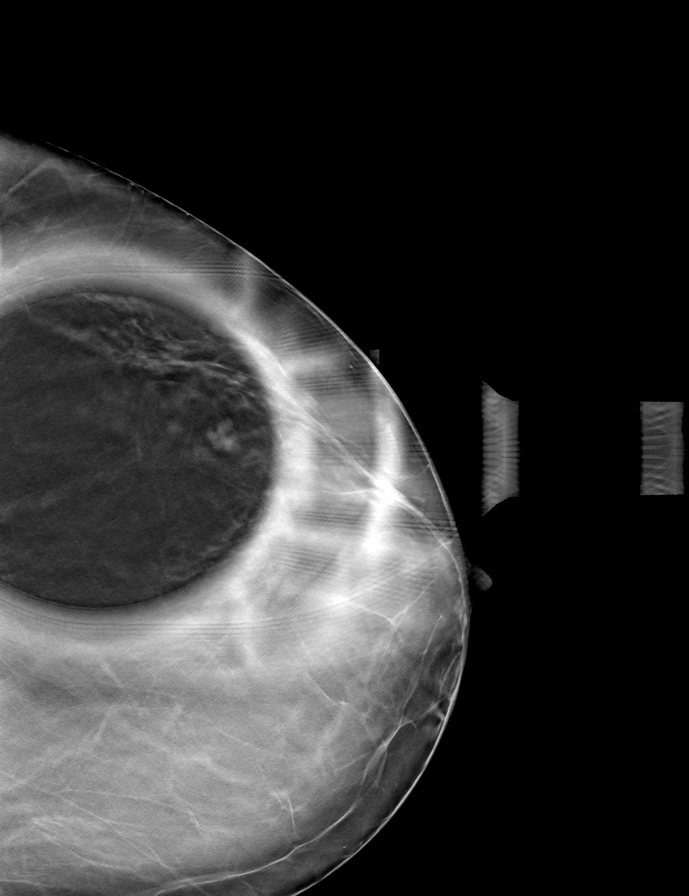

[R CC tomo · tomo slice 25/50.0]
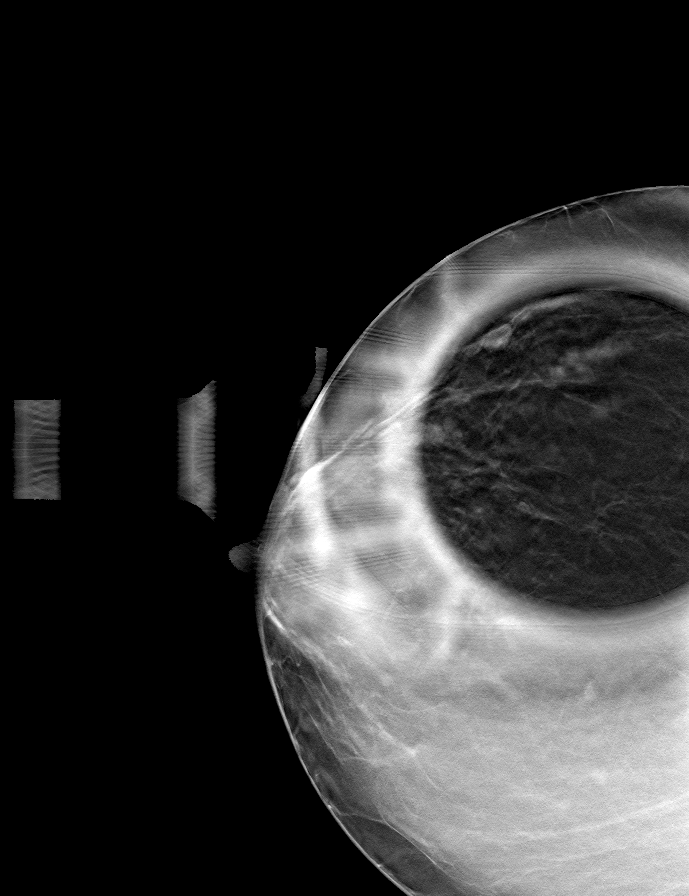

[9 of 24 positions shown; findings below may reference images not displayed]

ACR Breast Density Category b: There are scattered areas of
fibroglandular density.
FINDINGS: Additional imaging of the left breast was performed. There is
persistence of a 7 mm mass in the lateral aspect of the breast.
There are no malignant type microcalcifications.

Targeted ultrasound is performed, showing a probable cluster of
cysts (apocrine metaplasia) in the left breast at 2 o'clock 4 cm
from the nipple measuring 5 x 3 x 7 mm.
IMPRESSION: Probable benign apocrine metaplasia in the left breast.

RECOMMENDATION:
Short-term interval follow-up left breast ultrasound in 6 months is
recommended.

I have discussed the findings and recommendations with the patient.
Results were also provided in writing at the conclusion of the
visit. If applicable, a reminder letter will be sent to the patient
regarding the next appointment.

BI-RADS CATEGORY  3: Probably benign.

## 2018-09-08 NOTE — Progress Notes (Signed)
Virtual Visit via Video Note  I connected with Monica Reed on 09/09/2018 at  4:15 PM EDT by a video enabled telemedicine application and verified that I am speaking with the correct person using two identifiers.  Location: Patient: Home Provider: In Clinic   I discussed the limitations of evaluation and management by telemedicine and the availability of in person appointments. The patient expressed understanding and agreed to proceed.  History of Present Illness: Monica Reed is using WebEx today to connect for complaints 2 weeks  R Otalgia 7/10, facial tenderness, and copious clear nasal drainage She denies fever/night sweats/cough/chest pressure She denies change in smell/taste She denies exposure to Corona Virus OTC Acetaminophen and fluids- >80 oz water/day She reports ear infection/sinusitis every summer and states "this feels like my usual ear infection" She continues to abstain from tobacco/vape/ETOH use  Patient Care Team    Relationship Specialty Notifications Start End  William Hamburgeranford,  D, NP PCP - General Family Medicine  06/03/17   Carrington ClampHorvath, Michelle, MD Consulting Physician Obstetrics and Gynecology  06/03/17     Patient Active Problem List   Diagnosis Date Noted  . Otalgia of right ear 09/09/2018  . Acute rhinosinusitis 09/09/2018  . Mass of arm, right 06/23/2018  . Strain of gastrocnemius muscle of left lower extremity 09/24/2017  . BMI 28.0-28.9,adult 09/02/2017  . Healthcare maintenance 06/03/2017  . Migraine 05/29/2017  . Anxiety 12/23/2014  . Hypertension 03/26/2013     Past Medical History:  Diagnosis Date  . Frequent UTI   . Hypertension      History reviewed. No pertinent surgical history.   Family History  Problem Relation Age of Onset  . Arthritis Mother   . Cancer Father        prostate  . Heart attack Father   . Healthy Sister   . Healthy Brother   . Hypertension Daughter   . Healthy Brother   . Healthy Daughter   . Healthy  Daughter      Social History   Substance and Sexual Activity  Drug Use No     Social History   Substance and Sexual Activity  Alcohol Use No     Social History   Tobacco Use  Smoking Status Never Smoker  Smokeless Tobacco Never Used     Outpatient Encounter Medications as of 09/09/2018  Medication Sig  . FLUoxetine (PROZAC) 40 MG capsule Take 1 capsule by mouth daily.  Marland Kitchen. lisinopril-hydrochlorothiazide (PRINZIDE,ZESTORETIC) 20-12.5 MG tablet Take 1 tablet by mouth daily.  Marland Kitchen. loratadine (CLARITIN) 10 MG tablet Take 10 mg by mouth daily.  . norethindrone-ethinyl estradiol (JUNEL FE 1/20) 1-20 MG-MCG tablet Take 1 tablet by mouth daily.  . phentermine (ADIPEX-P) 37.5 MG tablet TAKE 1 TABLET BY MOUTH EVERY DAY BEFORE BREAKFAST  . amoxicillin-clavulanate (AUGMENTIN) 875-125 MG tablet Take 1 tablet by mouth 2 (two) times daily.   No facility-administered encounter medications on file as of 09/09/2018.     Allergies: Cyclobenzaprine  Body mass index is 26.17 kg/m.  Blood pressure 128/80, temperature 98.7 F (37.1 C), temperature source Oral, weight 134 lb (60.8 kg). Review of Systems: General:   Denies fever, chills, unexplained weight loss.  Optho/Auditory:   Denies visual changes, blurred vision/LOV ENT: Ear Pain +, Nasal Drainage + Respiratory:   Denies SOB, DOE more than baseline levels.  Cardiovascular:   Denies chest pain, palpitations, new onset peripheral edema  Gastrointestinal:   Denies nausea, vomiting, diarrhea.  Genitourinary: Denies dysuria, freq/ urgency, flank pain or discharge  from genitals.  Endocrine:     Denies hot or cold intolerance, polyuria, polydipsia. Musculoskeletal:   Denies unexplained myalgias, joint swelling, unexplained arthralgias, gait problems.  Skin:  Denies rash, suspicious lesions Neurological:     Denies dizziness, unexplained weakness, numbness  Psychiatric/Behavioral:   Denies mood changes, suicidal or homicidal ideations,  hallucinations   Observations/Objective: No acute distress noted during the WebEx video  Assessment and Plan: Augmentin  Continue to push fluids Alternate OTC Acetaminophen and Ibuprofen If yeast infection sx's develop-call clinic If sx's persist after ABX completed, please call clinic  Follow Up Instructions: PRN   I discussed the assessment and treatment plan with the patient. The patient was provided an opportunity to ask questions and all were answered. The patient agreed with the plan and demonstrated an understanding of the instructions.   The patient was advised to call back or seek an in-person evaluation if the symptoms worsen or if the condition fails to improve as anticipated.  I provided  59minutes of non-face-to-face time during this encounter.   Esaw Grandchild, NP

## 2018-09-09 ENCOUNTER — Encounter: Payer: Self-pay | Admitting: Adult Health

## 2018-09-09 ENCOUNTER — Ambulatory Visit (INDEPENDENT_AMBULATORY_CARE_PROVIDER_SITE_OTHER): Payer: BC Managed Care – PPO | Admitting: Adult Health

## 2018-09-09 ENCOUNTER — Other Ambulatory Visit: Payer: Self-pay

## 2018-09-09 DIAGNOSIS — J019 Acute sinusitis, unspecified: Secondary | ICD-10-CM | POA: Diagnosis not present

## 2018-09-09 DIAGNOSIS — H9201 Otalgia, right ear: Secondary | ICD-10-CM | POA: Diagnosis not present

## 2018-09-09 MED ORDER — AMOXICILLIN-POT CLAVULANATE 875-125 MG PO TABS: 1.0000 | ORAL_TABLET | Freq: Two times a day (BID) | ORAL | 0 refills | Status: DC

## 2018-09-09 NOTE — Assessment & Plan Note (Signed)
Assessment and Plan: Augmentin  Continue to push fluids Alternate OTC Acetaminophen and Ibuprofen If yeast infection sx's develop-call clinic If sx's persist after ABX completed, please call clinic  Follow Up Instructions: PRN   I discussed the assessment and treatment plan with the patient. The patient was provided an opportunity to ask questions and all were answered. The patient agreed with the plan and demonstrated an understanding of the instructions.   The patient was advised to call back or seek an in-person evaluation if the symptoms worsen or if the condition fails to improve as anticipated. 

## 2018-09-09 NOTE — Assessment & Plan Note (Signed)
Assessment and Plan: Augmentin  Continue to push fluids Alternate OTC Acetaminophen and Ibuprofen If yeast infection sx's develop-call clinic If sx's persist after ABX completed, please call clinic  Follow Up Instructions: PRN   I discussed the assessment and treatment plan with the patient. The patient was provided an opportunity to ask questions and all were answered. The patient agreed with the plan and demonstrated an understanding of the instructions.   The patient was advised to call back or seek an in-person evaluation if the symptoms worsen or if the condition fails to improve as anticipated.

## 2018-09-18 ENCOUNTER — Encounter: Payer: Self-pay | Admitting: Adult Health

## 2018-09-18 ENCOUNTER — Other Ambulatory Visit: Payer: Self-pay | Admitting: Adult Health

## 2018-09-18 MED ORDER — FLUCONAZOLE 150 MG PO TABS: 150.0000 mg | ORAL_TABLET | Freq: Once | ORAL | 0 refills | Status: AC

## 2018-09-21 ENCOUNTER — Other Ambulatory Visit: Payer: Self-pay

## 2018-09-21 ENCOUNTER — Other Ambulatory Visit: Payer: BC Managed Care – PPO

## 2018-09-21 DIAGNOSIS — Z0000 Encounter for general adult medical examination without abnormal findings: Secondary | ICD-10-CM | POA: Diagnosis not present

## 2018-09-21 DIAGNOSIS — I10 Essential (primary) hypertension: Secondary | ICD-10-CM

## 2018-09-22 ENCOUNTER — Other Ambulatory Visit: Payer: BLUE CROSS/BLUE SHIELD

## 2018-09-22 LAB — COMPREHENSIVE METABOLIC PANEL
ALT: 7 IU/L (ref 0–32)
AST: 15 IU/L (ref 0–40)
Albumin/Globulin Ratio: 1.5 (ref 1.2–2.2)
Albumin: 4 g/dL (ref 3.8–4.8)
Alkaline Phosphatase: 51 IU/L (ref 39–117)
BUN/Creatinine Ratio: 17 (ref 9–23)
BUN: 10 mg/dL (ref 6–24)
Bilirubin Total: 0.3 mg/dL (ref 0.0–1.2)
CO2: 23 mmol/L (ref 20–29)
Calcium: 8.4 mg/dL — ABNORMAL LOW (ref 8.7–10.2)
Chloride: 102 mmol/L (ref 96–106)
Creatinine, Ser: 0.59 mg/dL (ref 0.57–1.00)
GFR calc Af Amer: 125 mL/min/{1.73_m2} (ref >59)
GFR calc non Af Amer: 109 mL/min/{1.73_m2} (ref >59)
Globulin, Total: 2.6 g/dL (ref 1.5–4.5)
Glucose: 83 mg/dL (ref 65–99)
Potassium: 4 mmol/L (ref 3.5–5.2)
Sodium: 138 mmol/L (ref 134–144)
Total Protein: 6.6 g/dL (ref 6.0–8.5)

## 2018-09-22 LAB — LIPID PANEL
Chol/HDL Ratio: 4.3 ratio (ref 0.0–4.4)
Cholesterol, Total: 218 mg/dL — ABNORMAL HIGH (ref 100–199)
HDL: 51 mg/dL (ref >39)
LDL Chol Calc (NIH): 151 mg/dL — ABNORMAL HIGH (ref 0–99)
Triglycerides: 91 mg/dL (ref 0–149)
VLDL Cholesterol Cal: 16 mg/dL (ref 5–40)

## 2018-09-22 LAB — CBC WITH DIFFERENTIAL/PLATELET
Basophils Absolute: 0 10*3/uL (ref 0.0–0.2)
Basos: 0 %
EOS (ABSOLUTE): 0.2 10*3/uL (ref 0.0–0.4)
Eos: 2 %
Hematocrit: 39.1 % (ref 34.0–46.6)
Hemoglobin: 13.3 g/dL (ref 11.1–15.9)
Immature Grans (Abs): 0 10*3/uL (ref 0.0–0.1)
Immature Granulocytes: 0 %
Lymphocytes Absolute: 2.7 10*3/uL (ref 0.7–3.1)
Lymphs: 35 %
MCH: 31.3 pg (ref 26.6–33.0)
MCHC: 34 g/dL (ref 31.5–35.7)
MCV: 92 fL (ref 79–97)
Monocytes Absolute: 0.4 10*3/uL (ref 0.1–0.9)
Monocytes: 5 %
Neutrophils Absolute: 4.4 10*3/uL (ref 1.4–7.0)
Neutrophils: 58 %
Platelets: 284 10*3/uL (ref 150–450)
RBC: 4.25 x10E6/uL (ref 3.77–5.28)
RDW: 12.6 % (ref 11.7–15.4)
WBC: 7.7 10*3/uL (ref 3.4–10.8)

## 2018-09-22 LAB — HEMOGLOBIN A1C
Est. average glucose Bld gHb Est-mCnc: 103 mg/dL
Hgb A1c MFr Bld: 5.2 % (ref 4.8–5.6)

## 2018-09-22 LAB — TSH: TSH: 1.82 u[IU]/mL (ref 0.450–4.500)

## 2018-09-28 NOTE — Progress Notes (Signed)
Subjective:    Patient ID: Monica Reed, female    DOB: [birth year], 48 y.o.   MRN: [id number]  HPI: Monica Reed is here for CPE  She has completed last three month course of phentermine course Current wt 135-137 and she states "I feel terrific". Body mass index is 26.69 kg/m.  She denies regular exercise, reports activity with house work and working [employer] She continues to abstain from tobacco/vape/ETOH use   09/21/2018 Labs: TSH-WNL, 1.820  A1c-WNL, 5.2  CMP-stable  CBC-stable  The 10-year ASCVD risk score Monica Reed DC Jr., et al., 2013) is: 1.8%  Values used to calculate the score:   Age: 15 years   Sex: Female   Is Non-Hispanic African American: No   Diabetic: No   Tobacco smoker: No   Systolic Blood Pressure: 782 mmHg   Is BP treated: Yes   HDL Cholesterol: 51 mg/dL   Total Cholesterol: 218 mg/dL  LDL-151   Healthcare Maintenance: PAP-UTD, tracking down Mammogram-UTD, 07/2018- Repeat L breast inc axillary 01/2019 Colonoscopy-N/A Immunizations-UTD   Patient Care Team    Relationship Specialty Notifications Start End  Mina Marble D, NP PCP - General Family Medicine  06/03/17   Bobbye Charleston, MD Consulting Physician Obstetrics and Gynecology  06/03/17     Patient Active Problem List   Diagnosis Date Noted  . Elevated LDL cholesterol level 09/29/2018  . Otalgia of right ear 09/09/2018  . Acute rhinosinusitis 09/09/2018  . Mass of arm, right 06/23/2018  . Strain of gastrocnemius muscle of left lower extremity 09/24/2017  . BMI 26.0-26.9,adult 09/02/2017  . Healthcare maintenance 06/03/2017  . Migraine 05/29/2017  . Anxiety 12/23/2014  . Hypertension 03/26/2013     Past Medical History:  Diagnosis Date  . Frequent UTI   . Hypertension      History reviewed. No pertinent surgical history.   Family History  Problem Relation Age of Onset  . Arthritis Mother   . Cancer Father        prostate  . Heart  attack Father   . Healthy Sister   . Healthy Brother   . Hypertension Daughter   . Healthy Brother   . Healthy Daughter   . Healthy Daughter      Social History   Substance and Sexual Activity  Drug Use No     Social History   Substance and Sexual Activity  Alcohol Use No     Social History   Tobacco Use  Smoking Status Never Smoker  Smokeless Tobacco Never Used     Outpatient Encounter Medications as of 09/29/2018  Medication Sig  . FLUoxetine (PROZAC) 40 MG capsule Take 1 capsule by mouth daily.  Marland Kitchen lisinopril-hydrochlorothiazide (PRINZIDE,ZESTORETIC) 20-12.5 MG tablet Take 1 tablet by mouth daily.  Marland Kitchen loratadine (CLARITIN) 10 MG tablet Take 10 mg by mouth daily.  . norethindrone-ethinyl estradiol (JUNEL FE 1/20) 1-20 MG-MCG tablet Take 1 tablet by mouth daily.  . [DISCONTINUED] amoxicillin-clavulanate (AUGMENTIN) 875-125 MG tablet Take 1 tablet by mouth 2 (two) times daily.  . [DISCONTINUED] phentermine (ADIPEX-P) 37.5 MG tablet TAKE 1 TABLET BY MOUTH EVERY DAY BEFORE BREAKFAST   No facility-administered encounter medications on file as of 09/29/2018.     Allergies: Cyclobenzaprine  Body mass index is 26.69 kg/m.  Blood pressure 106/67, pulse 73, temperature 99.1 F (37.3 C), temperature source Oral, height 5' 0.25" (1.53 m), weight 137 lb 12.8 oz (62.5 kg), SpO2 99 %.   Review of Systems  Constitutional: Positive for fatigue. Negative  for activity change, appetite change, chills, diaphoresis, fever and unexpected weight change.  HENT: Negative for congestion.   Eyes: Negative for visual disturbance.  Respiratory: Negative for cough, chest tightness, shortness of breath, wheezing and stridor.   Cardiovascular: Negative for chest pain, palpitations and leg swelling.  Gastrointestinal: Negative for abdominal distention, anal bleeding, blood in stool, constipation, diarrhea, nausea and vomiting.  Endocrine: Negative for cold intolerance, heat intolerance,  polydipsia, polyphagia and polyuria.  Genitourinary: Negative for difficulty urinating and flank pain.  Musculoskeletal: Negative for arthralgias, back pain, gait problem, joint swelling, myalgias, neck pain and neck stiffness.  Skin: Negative for color change, pallor, rash and wound.  Neurological: Negative for dizziness and headaches.  Hematological: Negative for adenopathy. Does not bruise/bleed easily.  Psychiatric/Behavioral: Negative for agitation, behavioral problems, confusion, decreased concentration, dysphoric mood, hallucinations, self-injury, sleep disturbance and suicidal ideas. The patient is not nervous/anxious and is not hyperactive.        Objective:   Physical Exam Vitals signs and nursing note reviewed.  Constitutional:      General: She is not in acute distress.    Appearance: Normal appearance. She is normal weight. She is not ill-appearing, toxic-appearing or diaphoretic.  HENT:     Head: Normocephalic.     Right Ear: Tympanic membrane, ear canal and external ear normal. There is no impacted cerumen.     Left Ear: Tympanic membrane, ear canal and external ear normal. There is no impacted cerumen.     Nose: Nose normal. No congestion.     Mouth/Throat:     Mouth: Mucous membranes are moist.     Pharynx: No oropharyngeal exudate.  Eyes:     Extraocular Movements: Extraocular movements intact.     Conjunctiva/sclera: Conjunctivae normal.     Pupils: Pupils are equal, round, and reactive to light.  Neck:     Musculoskeletal: Normal range of motion and neck supple. No muscular tenderness.  Cardiovascular:     Rate and Rhythm: Normal rate and regular rhythm.     Pulses: Normal pulses.     Heart sounds: Normal heart sounds. No murmur. No friction rub. No gallop.   Pulmonary:     Effort: Pulmonary effort is normal. No respiratory distress.     Breath sounds: Normal breath sounds. No stridor. No wheezing, rhonchi or rales.  Chest:     Chest wall: No tenderness.   Abdominal:     General: Abdomen is flat. Bowel sounds are normal. There is no distension.     Palpations: Abdomen is soft. There is no mass.     Tenderness: There is no abdominal tenderness. There is no right CVA tenderness, left CVA tenderness, guarding or rebound.     Hernia: No hernia is present.  Musculoskeletal: Normal range of motion.  Skin:    General: Skin is warm and dry.     Capillary Refill: Capillary refill takes less than 2 seconds.  Neurological:     Mental Status: She is alert and oriented to person, place, and time.  Psychiatric:        Mood and Affect: Mood normal.        Behavior: Behavior normal.        Thought Content: Thought content normal.        Judgment: Judgment normal.       Assessment & Plan:   1. Elevated LDL cholesterol level   2. Healthcare maintenance   3. BMI 26.0-26.9,adult   4. Hypertension, unspecified type  Healthcare maintenance Blood pressure and weight look very good! Total and LDL (bad) cholesterol are elevated- recommend reducing saturated fat in diet and increasing regular exercise. Continue to social distance and wear a mask when in public. Follow-up 6 months, please come fasting so we can re-check cholesterol levels.  BMI 26.0-26.9,adult  She has completed last three month course of phentermine course Current wt 135-137 and she states "I feel terrific". Body mass index is 26.69 kg/m.   Hypertension BP at goal 106/67, HR 73 Continue Linsinopril/HCTZ 20/12.5mg  QD  Elevated LDL cholesterol level The 88-TGPQ ASCVD risk score Denman George DC Jr., et al., 2013) is: 1.8%  Values used to calculate the score:   Age: 19 years   Sex: Female   Is Non-Hispanic African American: No   Diabetic: No   Tobacco smoker: No   Systolic Blood Pressure: 128 mmHg   Is BP treated: Yes   HDL Cholesterol: 51 mg/dL   Total Cholesterol: 982 mg/dL  [id number] Recommend lifestyle modifications Re-check lipid panel in  22months     FOLLOW-UP:  Return in about 6 months (around 03/29/2019) for Regular Follow Up, HTN, Hypercholestermia.

## 2018-09-29 ENCOUNTER — Encounter: Payer: Self-pay | Admitting: Adult Health

## 2018-09-29 ENCOUNTER — Other Ambulatory Visit: Payer: Self-pay

## 2018-09-29 ENCOUNTER — Ambulatory Visit (INDEPENDENT_AMBULATORY_CARE_PROVIDER_SITE_OTHER): Payer: BC Managed Care – PPO | Admitting: Adult Health

## 2018-09-29 VITALS — BP 106/67 | HR 73 | Temp 99.1°F | Ht 60.25 in | Wt 137.8 lb

## 2018-09-29 DIAGNOSIS — E7800 Pure hypercholesterolemia, unspecified: Secondary | ICD-10-CM

## 2018-09-29 DIAGNOSIS — I10 Essential (primary) hypertension: Secondary | ICD-10-CM | POA: Diagnosis not present

## 2018-09-29 DIAGNOSIS — Z0000 Encounter for general adult medical examination without abnormal findings: Secondary | ICD-10-CM | POA: Diagnosis not present

## 2018-09-29 DIAGNOSIS — Z6826 Body mass index (BMI) 26.0-26.9, adult: Secondary | ICD-10-CM | POA: Diagnosis not present

## 2018-09-29 DIAGNOSIS — E785 Hyperlipidemia, unspecified: Secondary | ICD-10-CM | POA: Insufficient documentation

## 2018-09-29 NOTE — Assessment & Plan Note (Signed)
>>  ASSESSMENT AND PLAN FOR BMI 26.0-26.9,ADULT WRITTEN ON 09/29/2018  1:43 PM BY DANFORD, KATY D, NP   She has completed last three month course of phentermine course Current wt 135-137 and she states "I feel terrific". Body mass index is 26.69 kg/m.

## 2018-09-29 NOTE — Assessment & Plan Note (Signed)
Blood pressure and weight look very good! Total and LDL (bad) cholesterol are elevated- recommend reducing saturated fat in diet and increasing regular exercise. Continue to social distance and wear a mask when in public. Follow-up 6 months, please come fasting so we can re-check cholesterol levels.

## 2018-09-29 NOTE — Assessment & Plan Note (Signed)
The 10-year ASCVD risk score Mikey Bussing DC Brooke Bonito., et al., 2013) is: 1.8%  Values used to calculate the score:   Age: 48 years   Sex: Female   Is Non-Hispanic African American: No   Diabetic: No   Tobacco smoker: No   Systolic Blood Pressure: 343 mmHg   Is BP treated: Yes   HDL Cholesterol: 51 mg/dL   Total Cholesterol: 218 mg/dL  LDL-151 Recommend lifestyle modifications Re-check lipid panel in 63months

## 2018-09-29 NOTE — Assessment & Plan Note (Signed)
>>  ASSESSMENT AND PLAN FOR ELEVATED LDL CHOLESTEROL LEVEL WRITTEN ON 09/29/2018  1:45 PM BY DANFORD, KATY D, NP  The 10-year ASCVD risk score Denman George DC Jr., et al., 2013) is: 1.8%  Values used to calculate the score:   Age: 48 years   Sex: Female   Is Non-Hispanic African American: No   Diabetic: No   Tobacco smoker: No   Systolic Blood Pressure: 128 mmHg   Is BP treated: Yes   HDL Cholesterol: 51 mg/dL   Total Cholesterol: 147 mg/dL  [id number] Recommend lifestyle modifications Re-check lipid panel in 6months

## 2018-09-29 NOTE — Assessment & Plan Note (Signed)
BP at goal 106/67, HR 73 Continue Linsinopril/HCTZ 20/12.5mg  QD

## 2018-09-29 NOTE — Patient Instructions (Addendum)
Preventive Care for Adults, Female  A healthy lifestyle and preventive care can promote health and wellness. Preventive health guidelines for women include the following key practices.   A routine yearly physical is a good way to check with your health care provider about your health and preventive screening. It is a chance to share any concerns and updates on your health and to receive a thorough exam.   Visit your dentist for a routine exam and preventive care every 6 months. Brush your teeth twice a day and floss once a day. Good oral hygiene prevents tooth decay and gum disease.   The frequency of eye exams is based on your age, health, family medical history, use of contact lenses, and other factors. Follow your health care provider's recommendations for frequency of eye exams.   Eat a healthy diet. Foods like vegetables, fruits, whole grains, low-fat dairy products, and lean protein foods contain the nutrients you need without too many calories. Decrease your intake of foods high in solid fats, added sugars, and salt. Eat the right amount of calories for you.Get information about a proper diet from your health care provider, if necessary.   Regular physical exercise is one of the most important things you can do for your health. Most adults should get at least 150 minutes of moderate-intensity exercise (any activity that increases your heart rate and causes you to sweat) each week. In addition, most adults need muscle-strengthening exercises on 2 or more days a week.   Maintain a healthy weight. The body mass index (BMI) is a screening tool to identify possible weight problems. It provides an estimate of body fat based on height and weight. Your health care provider can find your BMI, and can help you achieve or maintain a healthy weight.For adults 20 years and older:   - A BMI below 18.5 is considered underweight.   - A BMI of 18.5 to 24.9 is normal.   - A BMI of 25 to 29.9 is  considered overweight.   - A BMI of 30 and above is considered obese.   Maintain normal blood lipids and cholesterol levels by exercising and minimizing your intake of trans and saturated fats.  Eat a balanced diet with plenty of fruit and vegetables. Blood tests for lipids and cholesterol should begin at age 20 and be repeated every 5 years minimum.  If your lipid or cholesterol levels are high, you are over 40, or you are at high risk for heart disease, you may need your cholesterol levels checked more frequently.Ongoing high lipid and cholesterol levels should be treated with medicines if diet and exercise are not working.   If you smoke, find out from your health care provider how to quit. If you do not use tobacco, do not start.   Lung cancer screening is recommended for adults aged 55-80 years who are at high risk for developing lung cancer because of a history of smoking. A yearly low-dose CT scan of the lungs is recommended for people who have at least a 30-pack-year history of smoking and are a current smoker or have quit within the past 15 years. A pack year of smoking is smoking an average of 1 pack of cigarettes a day for 1 year (for example: 1 pack a day for 30 years or 2 packs a day for 15 years). Yearly screening should continue until the smoker has stopped smoking for at least 15 years. Yearly screening should be stopped for people who develop a   health problem that would prevent them from having lung cancer treatment.   If you are pregnant, do not drink alcohol. If you are breastfeeding, be very cautious about drinking alcohol. If you are not pregnant and choose to drink alcohol, do not have more than 1 drink per day. One drink is considered to be 12 ounces (355 mL) of beer, 5 ounces (148 mL) of wine, or 1.5 ounces (44 mL) of liquor.   Avoid use of street drugs. Do not share needles with anyone. Ask for help if you need support or instructions about stopping the use of  drugs.   High blood pressure causes heart disease and increases the risk of stroke. Your blood pressure should be checked at least yearly.  Ongoing high blood pressure should be treated with medicines if weight loss and exercise do not work.   If you are 69-55 years old, ask your health care provider if you should take aspirin to prevent strokes.   Diabetes screening involves taking a blood sample to check your fasting blood sugar level. This should be done once every 3 years, after age 38, if you are within normal weight and without risk factors for diabetes. Testing should be considered at a younger age or be carried out more frequently if you are overweight and have at least 1 risk factor for diabetes.   Breast cancer screening is essential preventive care for women. You should practice "breast self-awareness."  This means understanding the normal appearance and feel of your breasts and may include breast self-examination.  Any changes detected, no matter how small, should be reported to a health care provider.  Women in their 80s and 30s should have a clinical breast exam (CBE) by a health care provider as part of a regular health exam every 1 to 3 years.  After age 66, women should have a CBE every year.  Starting at age 1, women should consider having a mammogram (breast X-ray test) every year.  Women who have a family history of breast cancer should talk to their health care provider about genetic screening.  Women at a high risk of breast cancer should talk to their health care providers about having an MRI and a mammogram every year.   -Breast cancer gene (BRCA)-related cancer risk assessment is recommended for women who have family members with BRCA-related cancers. BRCA-related cancers include breast, ovarian, tubal, and peritoneal cancers. Having family members with these cancers may be associated with an increased risk for harmful changes (mutations) in the breast cancer genes BRCA1 and  BRCA2. Results of the assessment will determine the need for genetic counseling and BRCA1 and BRCA2 testing.   The Pap test is a screening test for cervical cancer. A Pap test can show cell changes on the cervix that might become cervical cancer if left untreated. A Pap test is a procedure in which cells are obtained and examined from the lower end of the uterus (cervix).   - Women should have a Pap test starting at age 57.   - Between ages 90 and 70, Pap tests should be repeated every 2 years.   - Beginning at age 63, you should have a Pap test every 3 years as long as the past 3 Pap tests have been normal.   - Some women have medical problems that increase the chance of getting cervical cancer. Talk to your health care provider about these problems. It is especially important to talk to your health care provider if a  new problem develops soon after your last Pap test. In these cases, your health care provider may recommend more frequent screening and Pap tests.   - The above recommendations are the same for women who have or have not gotten the vaccine for human papillomavirus (HPV).   - If you had a hysterectomy for a problem that was not cancer or a condition that could lead to cancer, then you no longer need Pap tests. Even if you no longer need a Pap test, a regular exam is a good idea to make sure no other problems are starting.   - If you are between ages 36 and 66 years, and you have had normal Pap tests going back 10 years, you no longer need Pap tests. Even if you no longer need a Pap test, a regular exam is a good idea to make sure no other problems are starting.   - If you have had past treatment for cervical cancer or a condition that could lead to cancer, you need Pap tests and screening for cancer for at least 20 years after your treatment.   - If Pap tests have been discontinued, risk factors (such as a new sexual partner) need to be reassessed to determine if screening should  be resumed.   - The HPV test is an additional test that may be used for cervical cancer screening. The HPV test looks for the virus that can cause the cell changes on the cervix. The cells collected during the Pap test can be tested for HPV. The HPV test could be used to screen women aged 70 years and older, and should be used in women of any age who have unclear Pap test results. After the age of 67, women should have HPV testing at the same frequency as a Pap test.   Colorectal cancer can be detected and often prevented. Most routine colorectal cancer screening begins at the age of 57 years and continues through age 26 years. However, your health care provider may recommend screening at an earlier age if you have risk factors for colon cancer. On a yearly basis, your health care provider may provide home test kits to check for hidden blood in the stool.  Use of a small camera at the end of a tube, to directly examine the colon (sigmoidoscopy or colonoscopy), can detect the earliest forms of colorectal cancer. Talk to your health care provider about this at age 23, when routine screening begins. Direct exam of the colon should be repeated every 5 -10 years through age 49 years, unless early forms of pre-cancerous polyps or small growths are found.   People who are at an increased risk for hepatitis B should be screened for this virus. You are considered at high risk for hepatitis B if:  -You were born in a country where hepatitis B occurs often. Talk with your health care provider about which countries are considered high risk.  - Your parents were born in a high-risk country and you have not received a shot to protect against hepatitis B (hepatitis B vaccine).  - You have HIV or AIDS.  - You use needles to inject street drugs.  - You live with, or have sex with, someone who has Hepatitis B.  - You get hemodialysis treatment.  - You take certain medicines for conditions like cancer, organ  transplantation, and autoimmune conditions.   Hepatitis C blood testing is recommended for all people born from [birth year] through 1965 and any individual  with known risks for hepatitis C.   Practice safe sex. Use condoms and avoid high-risk sexual practices to reduce the spread of sexually transmitted infections (STIs). STIs include gonorrhea, chlamydia, syphilis, trichomonas, herpes, HPV, and human immunodeficiency virus (HIV). Herpes, HIV, and HPV are viral illnesses that have no cure. They can result in disability, cancer, and death. Sexually active women aged 25 years and younger should be checked for chlamydia. Older women with new or multiple partners should also be tested for chlamydia. Testing for other STIs is recommended if you are sexually active and at increased risk.   Osteoporosis is a disease in which the bones lose minerals and strength with aging. This can result in serious bone fractures or breaks. The risk of osteoporosis can be identified using a bone density scan. Women ages 65 years and over and women at risk for fractures or osteoporosis should discuss screening with their health care providers. Ask your health care provider whether you should take a calcium supplement or vitamin D to There are also several preventive steps women can take to avoid osteoporosis and resulting fractures or to keep osteoporosis from worsening. -->Recommendations include:  Eat a balanced diet high in fruits, vegetables, calcium, and vitamins.  Get enough calcium. The recommended total intake of is 1,200 mg daily; for best absorption, if taking supplements, divide doses into 250-500 mg doses throughout the day. Of the two types of calcium, calcium carbonate is best absorbed when taken with food but calcium citrate can be taken on an empty stomach.  Get enough vitamin D. NAMS and the National Osteoporosis Foundation recommend at least 1,000 IU per day for women age 50 and over who are at risk of vitamin D  deficiency. Vitamin D deficiency can be caused by inadequate sun exposure (for example, those who live in northern latitudes).  Avoid alcohol and smoking. Heavy alcohol intake (more than 7 drinks per week) increases the risk of falls and hip fracture and women smokers tend to lose bone more rapidly and have lower bone mass than nonsmokers. Stopping smoking is one of the most important changes women can make to improve their health and decrease risk for disease.  Be physically active every day. Weight-bearing exercise (for example, fast walking, hiking, jogging, and weight training) may strengthen bones or slow the rate of bone loss that comes with aging. Balancing and muscle-strengthening exercises can reduce the risk of falling and fracture.  Consider therapeutic medications. Currently, several types of effective drugs are available. Healthcare providers can recommend the type most appropriate for each woman.  Eliminate environmental factors that may contribute to accidents. Falls cause nearly 90% of all osteoporotic fractures, so reducing this risk is an important bone-health strategy. Measures include ample lighting, removing obstructions to walking, using nonskid rugs on floors, and placing mats and/or grab bars in showers.  Be aware of medication side effects. Some common medicines make bones weaker. These include a type of steroid drug called glucocorticoids used for arthritis and asthma, some antiseizure drugs, certain sleeping pills, treatments for endometriosis, and some cancer drugs. An overactive thyroid gland or using too much thyroid hormone for an underactive thyroid can also be a problem. If you are taking these medicines, talk to your doctor about what you can do to help protect your bones.reduce the rate of osteoporosis.    Menopause can be associated with physical symptoms and risks. Hormone replacement therapy is available to decrease symptoms and risks. You should talk to your  health care provider   about whether hormone replacement therapy is right for you.   Use sunscreen. Apply sunscreen liberally and repeatedly throughout the day. You should seek shade when your shadow is shorter than you. Protect yourself by wearing long sleeves, pants, a wide-brimmed hat, and sunglasses year round, whenever you are outdoors.   Once a month, do a whole body skin exam, using a mirror to look at the skin on your back. Tell your health care provider of new moles, moles that have irregular borders, moles that are larger than a pencil eraser, or moles that have changed in shape or color.   -Stay current with required vaccines (immunizations).   Influenza vaccine. All adults should be immunized every year.  Tetanus, diphtheria, and acellular pertussis (Td, Tdap) vaccine. Pregnant women should receive 1 dose of Tdap vaccine during each pregnancy. The dose should be obtained regardless of the length of time since the last dose. Immunization is preferred during the 27th 36th week of gestation. An adult who has not previously received Tdap or who does not know her vaccine status should receive 1 dose of Tdap. This initial dose should be followed by tetanus and diphtheria toxoids (Td) booster doses every 10 years. Adults with an unknown or incomplete history of completing a 3-dose immunization series with Td-containing vaccines should begin or complete a primary immunization series including a Tdap dose. Adults should receive a Td booster every 10 years.  Varicella vaccine. An adult without evidence of immunity to varicella should receive 2 doses or a second dose if she has previously received 1 dose. Pregnant females who do not have evidence of immunity should receive the first dose after pregnancy. This first dose should be obtained before leaving the health care facility. The second dose should be obtained 4 8 weeks after the first dose.  Human papillomavirus (HPV) vaccine. Females aged 13 26  years who have not received the vaccine previously should obtain the 3-dose series. The vaccine is not recommended for use in pregnant females. However, pregnancy testing is not needed before receiving a dose. If a female is found to be pregnant after receiving a dose, no treatment is needed. In that case, the remaining doses should be delayed until after the pregnancy. Immunization is recommended for any person with an immunocompromised condition through the age of 26 years if she did not get any or all doses earlier. During the 3-dose series, the second dose should be obtained 4 8 weeks after the first dose. The third dose should be obtained 24 weeks after the first dose and 16 weeks after the second dose.  Zoster vaccine. One dose is recommended for adults aged 60 years or older unless certain conditions are present.  Measles, mumps, and rubella (MMR) vaccine. Adults born before 1957 generally are considered immune to measles and mumps. Adults born in 1957 or later should have 1 or more doses of MMR vaccine unless there is a contraindication to the vaccine or there is laboratory evidence of immunity to each of the three diseases. A routine second dose of MMR vaccine should be obtained at least 28 days after the first dose for students attending postsecondary schools, health care workers, or international travelers. People who received inactivated measles vaccine or an unknown type of measles vaccine during 1963 1967 should receive 2 doses of MMR vaccine. People who received inactivated mumps vaccine or an unknown type of mumps vaccine before 1979 and are at high risk for mumps infection should consider immunization with 2 doses of   MMR vaccine. For females of childbearing age, rubella immunity should be determined. If there is no evidence of immunity, females who are not pregnant should be vaccinated. If there is no evidence of immunity, females who are pregnant should delay immunization until after pregnancy.  Unvaccinated health care workers born before [birth year] who lack laboratory evidence of measles, mumps, or rubella immunity or laboratory confirmation of disease should consider measles and mumps immunization with 2 doses of MMR vaccine or rubella immunization with 1 dose of MMR vaccine.  Pneumococcal 13-valent conjugate (PCV13) vaccine. When indicated, a person who is uncertain of her immunization history and has no record of immunization should receive the PCV13 vaccine. An adult aged 54 years or older who has certain medical conditions and has not been previously immunized should receive 1 dose of PCV13 vaccine. This PCV13 should be followed with a dose of pneumococcal polysaccharide (PPSV23) vaccine. The PPSV23 vaccine dose should be obtained at least 8 weeks after the dose of PCV13 vaccine. An adult aged 58 years or older who has certain medical conditions and previously received 1 or more doses of PPSV23 vaccine should receive 1 dose of PCV13. The PCV13 vaccine dose should be obtained 1 or more years after the last PPSV23 vaccine dose.  Pneumococcal polysaccharide (PPSV23) vaccine. When PCV13 is also indicated, PCV13 should be obtained first. All adults aged 58 years and older should be immunized. An adult younger than age 65 years who has certain medical conditions should be immunized. Any person who resides in a nursing home or long-term care facility should be immunized. An adult smoker should be immunized. People with an immunocompromised condition and certain other conditions should receive both PCV13 and PPSV23 vaccines. People with human immunodeficiency virus (HIV) infection should be immunized as soon as possible after diagnosis. Immunization during chemotherapy or radiation therapy should be avoided. Routine use of PPSV23 vaccine is not recommended for American Indians, [place] Natives, or people younger than 65 years unless there are medical conditions that require PPSV23 vaccine. When indicated,  people who have unknown immunization and have no record of immunization should receive PPSV23 vaccine. One-time revaccination 5 years after the first dose of PPSV23 is recommended for people aged 70 64 years who have chronic kidney failure, nephrotic syndrome, asplenia, or immunocompromised conditions. People who received 1 2 doses of PPSV23 before age 32 years should receive another dose of PPSV23 vaccine at age 96 years or later if at least 5 years have passed since the previous dose. Doses of PPSV23 are not needed for people immunized with PPSV23 at or after age 55 years.  Meningococcal vaccine. Adults with asplenia or persistent complement component deficiencies should receive 2 doses of quadrivalent meningococcal conjugate (MenACWY-D) vaccine. The doses should be obtained at least 2 months apart. Microbiologists working with certain meningococcal bacteria, [employer] recruits, people at risk during an outbreak, and people who travel to or live in countries with a high rate of meningitis should be immunized. A first-year college student up through age 58 years who is living in a residence hall should receive a dose if she did not receive a dose on or after her 16th birthday. Adults who have certain high-risk conditions should receive one or more doses of vaccine.  Hepatitis A vaccine. Adults who wish to be protected from this disease, have certain high-risk conditions, work with hepatitis A-infected animals, work in hepatitis A research labs, or travel to or work in countries with a high rate of hepatitis A should be  immunized. Adults who were previously unvaccinated and who anticipate close contact with an international adoptee during the first 60 days after arrival in the [place] from a country with a high rate of hepatitis A should be immunized.  Hepatitis B vaccine.  Adults who wish to be protected from this disease, have certain high-risk conditions, may be exposed to blood or other infectious  body fluids, are household contacts or sex partners of hepatitis B positive people, are clients or workers in certain care facilities, or travel to or work in countries with a high rate of hepatitis B should be immunized.  Haemophilus influenzae type b (Hib) vaccine. A previously unvaccinated person with asplenia or sickle cell disease or having a scheduled splenectomy should receive 1 dose of Hib vaccine. Regardless of previous immunization, a recipient of a hematopoietic stem cell transplant should receive a 3-dose series 6 12 months after her successful transplant. Hib vaccine is not recommended for adults with HIV infection.  Preventive Services / Frequency Ages 6 to 39years  Blood pressure check.** / Every 1 to 2 years.  Lipid and cholesterol check.** / Every 5 years beginning at age 39.  Clinical breast exam.** / Every 3 years for women in their 61s and 62s.  BRCA-related cancer risk assessment.** / For women who have family members with a BRCA-related cancer (breast, ovarian, tubal, or peritoneal cancers).  Pap test.** / Every 2 years from ages 47 through 85. Every 3 years starting at age 34 through age 12 or 74 with a history of 3 consecutive normal Pap tests.  HPV screening.** / Every 3 years from ages 46 through ages 43 to 54 with a history of 3 consecutive normal Pap tests.  Hepatitis C blood test.** / For any individual with known risks for hepatitis C.  Skin self-exam. / Monthly.  Influenza vaccine. / Every year.  Tetanus, diphtheria, and acellular pertussis (Tdap, Td) vaccine.** / Consult your health care provider. Pregnant women should receive 1 dose of Tdap vaccine during each pregnancy. 1 dose of Td every 10 years.  Varicella vaccine.** / Consult your health care provider. Pregnant females who do not have evidence of immunity should receive the first dose after pregnancy.  HPV vaccine. / 3 doses over 6 months, if 64 and younger. The vaccine is not recommended for use in  pregnant females. However, pregnancy testing is not needed before receiving a dose.  Measles, mumps, rubella (MMR) vaccine.** / You need at least 1 dose of MMR if you were born in 1957 or later. You may also need a 2nd dose. For females of childbearing age, rubella immunity should be determined. If there is no evidence of immunity, females who are not pregnant should be vaccinated. If there is no evidence of immunity, females who are pregnant should delay immunization until after pregnancy.  Pneumococcal 13-valent conjugate (PCV13) vaccine.** / Consult your health care provider.  Pneumococcal polysaccharide (PPSV23) vaccine.** / 1 to 2 doses if you smoke cigarettes or if you have certain conditions.  Meningococcal vaccine.** / 1 dose if you are age 71 to 37 years and a Market researcher living in a residence hall, or have one of several medical conditions, you need to get vaccinated against meningococcal disease. You may also need additional booster doses.  Hepatitis A vaccine.** / Consult your health care provider.  Hepatitis B vaccine.** / Consult your health care provider.  Haemophilus influenzae type b (Hib) vaccine.** / Consult your health care provider.  Ages 55 to 64years  Blood pressure check.** / Every 1 to 2 years.  Lipid and cholesterol check.** / Every 5 years beginning at age 20 years.  Lung cancer screening. / Every year if you are aged 55 80 years and have a 30-pack-year history of smoking and currently smoke or have quit within the past 15 years. Yearly screening is stopped once you have quit smoking for at least 15 years or develop a health problem that would prevent you from having lung cancer treatment.  Clinical breast exam.** / Every year after age 40 years.  BRCA-related cancer risk assessment.** / For women who have family members with a BRCA-related cancer (breast, ovarian, tubal, or peritoneal cancers).  Mammogram.** / Every year beginning at age 40  years and continuing for as long as you are in good health. Consult with your health care provider.  Pap test.** / Every 3 years starting at age 30 years through age 65 or 70 years with a history of 3 consecutive normal Pap tests.  HPV screening.** / Every 3 years from ages 30 years through ages 65 to 70 years with a history of 3 consecutive normal Pap tests.  Fecal occult blood test (FOBT) of stool. / Every year beginning at age 50 years and continuing until age 75 years. You may not need to do this test if you get a colonoscopy every 10 years.  Flexible sigmoidoscopy or colonoscopy.** / Every 5 years for a flexible sigmoidoscopy or every 10 years for a colonoscopy beginning at age 50 years and continuing until age 75 years.  Hepatitis C blood test.** / For all people born from 1945 through 1965 and any individual with known risks for hepatitis C.  Skin self-exam. / Monthly.  Influenza vaccine. / Every year.  Tetanus, diphtheria, and acellular pertussis (Tdap/Td) vaccine.** / Consult your health care provider. Pregnant women should receive 1 dose of Tdap vaccine during each pregnancy. 1 dose of Td every 10 years.  Varicella vaccine.** / Consult your health care provider. Pregnant females who do not have evidence of immunity should receive the first dose after pregnancy.  Zoster vaccine.** / 1 dose for adults aged 60 years or older.  Measles, mumps, rubella (MMR) vaccine.** / You need at least 1 dose of MMR if you were born in 1957 or later. You may also need a 2nd dose. For females of childbearing age, rubella immunity should be determined. If there is no evidence of immunity, females who are not pregnant should be vaccinated. If there is no evidence of immunity, females who are pregnant should delay immunization until after pregnancy.  Pneumococcal 13-valent conjugate (PCV13) vaccine.** / Consult your health care provider.  Pneumococcal polysaccharide (PPSV23) vaccine.** / 1 to 2 doses if  you smoke cigarettes or if you have certain conditions.  Meningococcal vaccine.** / Consult your health care provider.  Hepatitis A vaccine.** / Consult your health care provider.  Hepatitis B vaccine.** / Consult your health care provider.  Haemophilus influenzae type b (Hib) vaccine.** / Consult your health care provider.  Ages 65 years and over  Blood pressure check.** / Every 1 to 2 years.  Lipid and cholesterol check.** / Every 5 years beginning at age 20 years.  Lung cancer screening. / Every year if you are aged 55 80 years and have a 30-pack-year history of smoking and currently smoke or have quit within the past 15 years. Yearly screening is stopped once you have quit smoking for at least 15 years or develop a health problem that   would prevent you from having lung cancer treatment.  Clinical breast exam.** / Every year after age 103 years.  BRCA-related cancer risk assessment.** / For women who have family members with a BRCA-related cancer (breast, ovarian, tubal, or peritoneal cancers).  Mammogram.** / Every year beginning at age 36 years and continuing for as long as you are in good health. Consult with your health care provider.  Pap test.** / Every 3 years starting at age 5 years through age 85 or 10 years with 3 consecutive normal Pap tests. Testing can be stopped between 65 and 70 years with 3 consecutive normal Pap tests and no abnormal Pap or HPV tests in the past 10 years.  HPV screening.** / Every 3 years from ages 93 years through ages 70 or 45 years with a history of 3 consecutive normal Pap tests. Testing can be stopped between 65 and 70 years with 3 consecutive normal Pap tests and no abnormal Pap or HPV tests in the past 10 years.  Fecal occult blood test (FOBT) of stool. / Every year beginning at age 8 years and continuing until age 45 years. You may not need to do this test if you get a colonoscopy every 10 years.  Flexible sigmoidoscopy or colonoscopy.** /  Every 5 years for a flexible sigmoidoscopy or every 10 years for a colonoscopy beginning at age 69 years and continuing until age 68 years.  Hepatitis C blood test.** / For all people born from [birth year] through 1965 and any individual with known risks for hepatitis C.  Osteoporosis screening.** / A one-time screening for women ages 7 years and over and women at risk for fractures or osteoporosis.  Skin self-exam. / Monthly.  Influenza vaccine. / Every year.  Tetanus, diphtheria, and acellular pertussis (Tdap/Td) vaccine.** / 1 dose of Td every 10 years.  Varicella vaccine.** / Consult your health care provider.  Zoster vaccine.** / 1 dose for adults aged 5 years or older.  Pneumococcal 13-valent conjugate (PCV13) vaccine.** / Consult your health care provider.  Pneumococcal polysaccharide (PPSV23) vaccine.** / 1 dose for all adults aged 74 years and older.  Meningococcal vaccine.** / Consult your health care provider.  Hepatitis A vaccine.** / Consult your health care provider.  Hepatitis B vaccine.** / Consult your health care provider.  Haemophilus influenzae type b (Hib) vaccine.** / Consult your health care provider. ** Family history and personal history of risk and conditions may change your health care provider's recommendations. Document Released: 03/05/2001 Document Revised: 10/28/2012  [facility] Patient Information 2014 [place], [place].   EXERCISE AND DIET:  We recommended that you start or continue a regular exercise program for good health. Regular exercise means any activity that makes your heart beat faster and makes you sweat.  We recommend exercising at least 30 minutes per day at least 3 days a week, preferably 5.  We also recommend a diet low in fat and sugar / carbohydrates.  Inactivity, poor dietary choices and obesity can cause diabetes, heart attack, stroke, and kidney damage, among others.     ALCOHOL AND SMOKING:  Women should limit their alcohol intake to no  more than 7 drinks/beers/glasses of wine (combined, not each!) per week. Moderation of alcohol intake to this level decreases your risk of breast cancer and liver damage.  ( And of course, no recreational drugs are part of a healthy lifestyle.)  Also, you should not be smoking at all or even being exposed to second hand smoke. Most people know smoking can  cause cancer, and various heart and lung diseases, but did you know it also contributes to weakening of your bones?  Aging of your skin?  Yellowing of your teeth and nails?   CALCIUM AND VITAMIN D:  Adequate intake of calcium and Vitamin D are recommended.  The recommendations for exact amounts of these supplements seem to change often, but generally speaking 600 mg of calcium (either carbonate or citrate) and 800 units of Vitamin D per day seems prudent. Certain women may benefit from higher intake of Vitamin D.  If you are among these women, your doctor will have told you during your visit.     PAP SMEARS:  Pap smears, to check for cervical cancer or precancers,  have traditionally been done yearly, although recent scientific advances have shown that most women can have pap smears less often.  However, every woman still should have a physical exam from her gynecologist or primary care physician every year. It will include a breast check, inspection of the vulva and vagina to check for abnormal growths or skin changes, a visual exam of the cervix, and then an exam to evaluate the size and shape of the uterus and ovaries.  And after 48 years of age, a rectal exam is indicated to check for rectal cancers. We will also provide age appropriate advice regarding health maintenance, like when you should have certain vaccines, screening for sexually transmitted diseases, bone density testing, colonoscopy, mammograms, etc.    MAMMOGRAMS:  All women over 80 years old should have a yearly mammogram. Many facilities now offer a "3D" mammogram, which may cost  around $50 extra out of pocket. If possible,  we recommend you accept the option to have the 3D mammogram performed.  It both reduces the number of women who will be called back for extra views which then turn out to be normal, and it is better than the routine mammogram at detecting truly abnormal areas.     COLONOSCOPY:  Colonoscopy to screen for colon cancer is recommended for all women at age 35.  We know, you hate the idea of the prep.  We agree, BUT, having colon cancer and not knowing it is worse!!  Colon cancer so often starts as a polyp that can be seen and removed at colonscopy, which can quite literally save your life!  And if your first colonoscopy is normal and you have no family history of colon cancer, most women don't have to have it again for 10 years.  Once every ten years, you can do something that may end up saving your life, right?  We will be happy to help you get it scheduled when you are ready.  Be sure to check your insurance coverage so you understand how much it will cost.  It may be covered as a preventative service at no cost, but you should check your particular policy.   Blood pressure and weight look very good! Total and LDL (bad) cholesterol are elevated- recommend reducing saturated fat in diet and increasing regular exercise. Continue to social distance and wear a mask when in public. Follow-up 6 months, please come fasting so we can re-check cholesterol levels. GREAT TO SEE YOU!

## 2018-09-29 NOTE — Assessment & Plan Note (Signed)
She has completed last three month course of phentermine course Current wt 135-137 and she states "I feel terrific". Body mass index is 26.69 kg/m.

## 2018-10-22 ENCOUNTER — Encounter: Payer: Self-pay | Admitting: Adult Health

## 2018-11-11 ENCOUNTER — Other Ambulatory Visit: Payer: Self-pay | Admitting: Adult Health

## 2018-11-11 MED ORDER — LISINOPRIL-HYDROCHLOROTHIAZIDE 20-12.5 MG PO TABS: 1.0000 | ORAL_TABLET | Freq: Every day | ORAL | 1 refills | Status: DC

## 2018-11-11 NOTE — Telephone Encounter (Signed)
Patient called states already had necessary Appt for Rx refills but [facility] states refill order denied for OV 09/29/2018  ---Forwarding request to Med Asst for review w/ provider for refill of :   lisinopril-hydrochlorothiazide (PRINZIDE,ZESTORETIC) 20-12.5 MG tablet [[id number]]   Order Details Dose: 1 tablet Route: Oral Frequency: Daily  Dispense Quantity: -- Refills: -- Fills remaining: --        Sig: Take 1 tablet by mouth daily.     --Pls send refill order to:  [id number] [address], [place] [phone] (Phone) [phone] (Fax)   --Please contact patient w/ any questions / concerns [phone]  --glh

## 2018-11-11 NOTE — Telephone Encounter (Signed)
We have not prescribed these medications for the patient previously.  Please review and refill if appropriate.  T. Brandee Markin, CMA  

## 2018-11-19 ENCOUNTER — Other Ambulatory Visit: Payer: Self-pay | Admitting: Adult Health

## 2018-11-19 NOTE — Telephone Encounter (Signed)
Patient is requesting a refill of her fluoxetine, if approved please send to [facility] [place]

## 2018-11-20 NOTE — Addendum Note (Signed)
Addended by: Fonnie Mu on: 11/20/2018 12:44 PM   Modules accepted: Orders

## 2018-11-20 NOTE — Telephone Encounter (Signed)
We have not prescribed these medications for the patient previously.  Please review and refill if appropriate.  T. Dashley Monts, CMA  

## 2018-11-22 MED ORDER — FLUOXETINE HCL 40 MG PO CAPS: 40.0000 mg | ORAL_CAPSULE | Freq: Every day | ORAL | 1 refills | Status: DC

## 2019-02-09 DIAGNOSIS — R319 Hematuria, unspecified: Secondary | ICD-10-CM | POA: Diagnosis not present

## 2019-02-15 ENCOUNTER — Other Ambulatory Visit: Payer: Self-pay | Admitting: Obstetrics and Gynecology

## 2019-02-15 ENCOUNTER — Other Ambulatory Visit: Payer: Self-pay

## 2019-02-15 ENCOUNTER — Ambulatory Visit
Admission: RE | Admit: 2019-02-15 | Discharge: 2019-02-15 | Disposition: A | Discharge status: Home / Self Care | Payer: BC Managed Care – PPO | Source: Ambulatory Visit | Attending: Obstetrics and Gynecology | Admitting: Obstetrics and Gynecology

## 2019-02-15 DIAGNOSIS — N6002 Solitary cyst of left breast: Secondary | ICD-10-CM

## 2019-02-15 DIAGNOSIS — N6320 Unspecified lump in the left breast, unspecified quadrant: Secondary | ICD-10-CM

## 2019-02-15 DIAGNOSIS — N6089 Other benign mammary dysplasias of unspecified breast: Secondary | ICD-10-CM | POA: Diagnosis not present

## 2019-02-15 IMAGING — US US BREAST*L* LIMITED INC AXILLA
1 series · 4 of 4 positions shown · non-contrast
Comparison: Previous exam(s).

CLINICAL DATA: Short-term interval follow-up of benign apocrine
metaplasia in the left breast.

EXAM:
ULTRASOUND OF THE LEFT BREAST

[Series 1: us breast*left* limited inc axilla · 0.06mm/px · 4 of 4 slices shown]
[im 1/4]
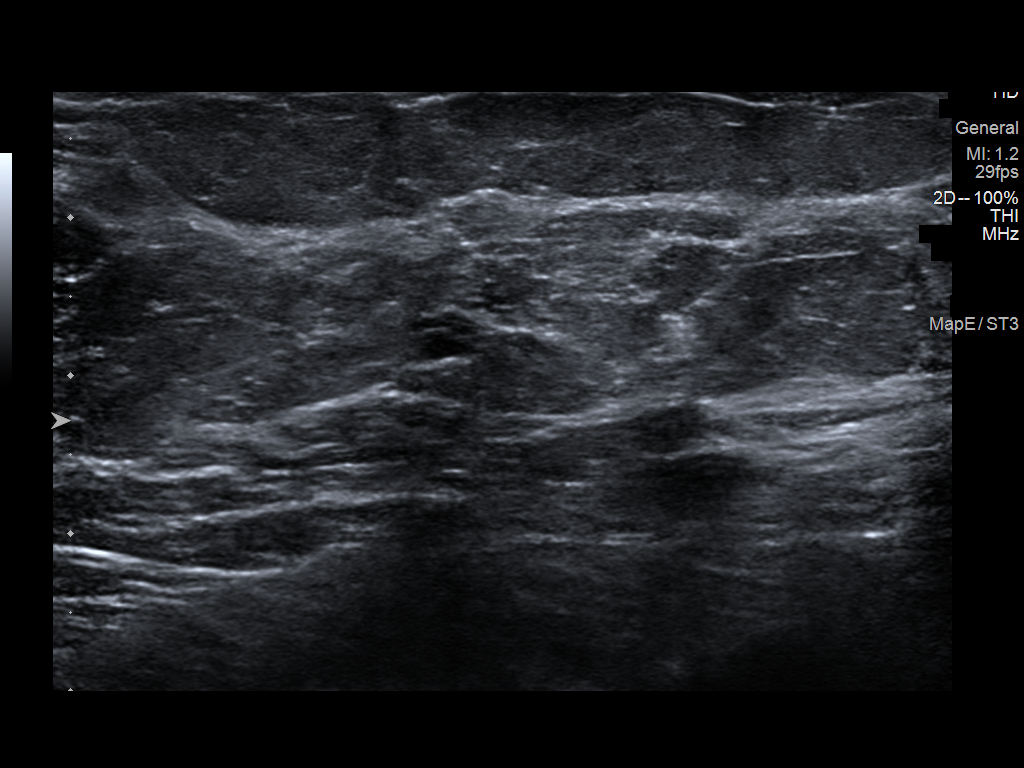
[im 2/4]
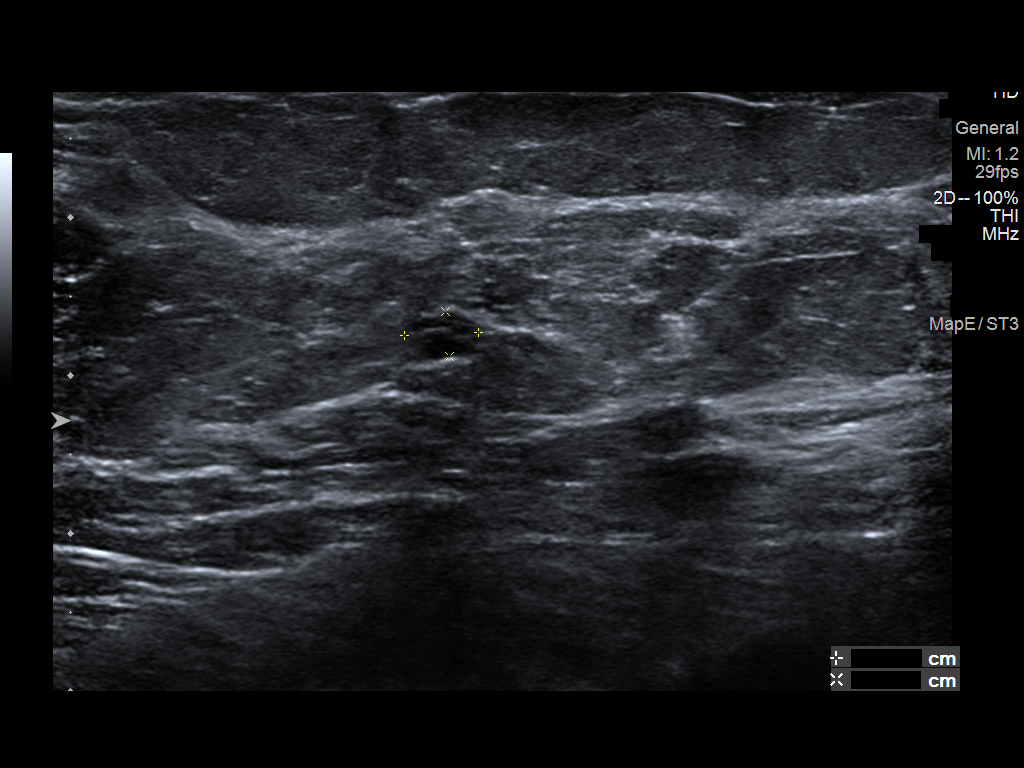
[im 3/4]
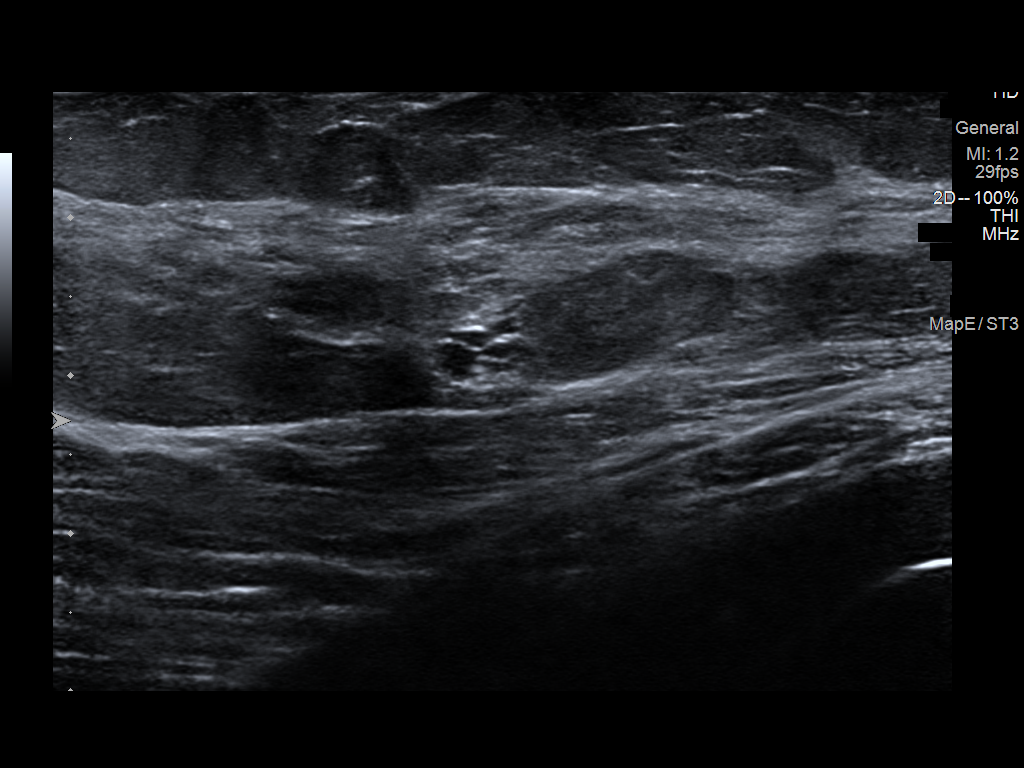
[im 4/4]
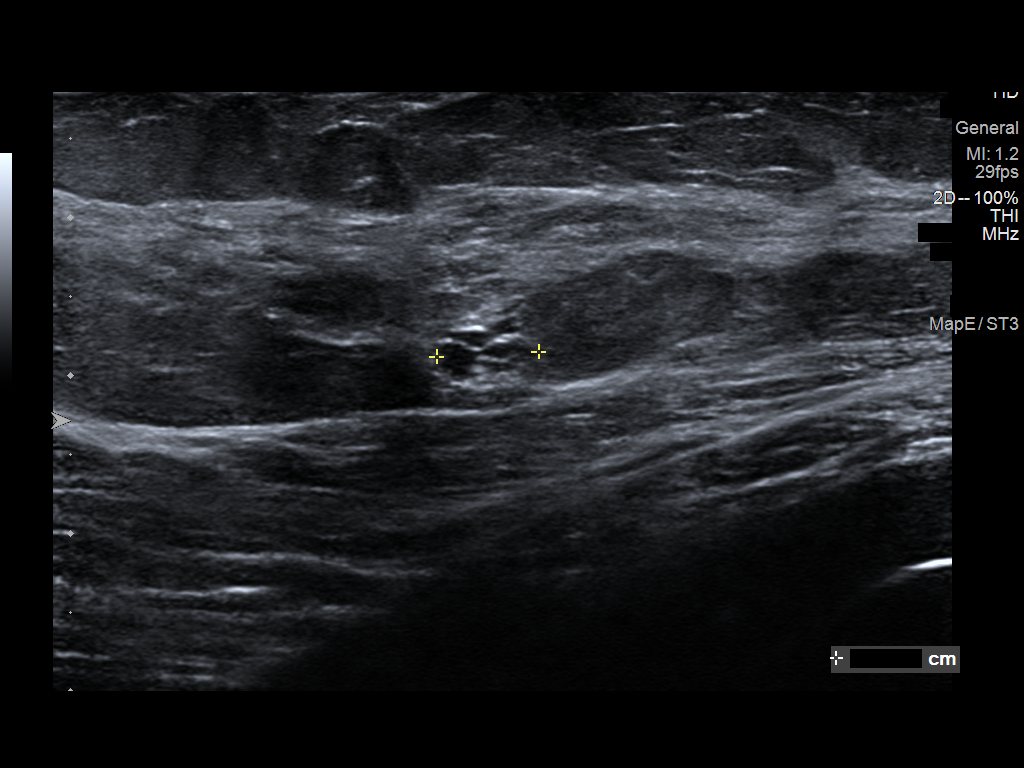

[4 of 4 positions shown; findings below may reference images not displayed]

FINDINGS: Targeted ultrasound is performed, showing there are stable probable
benign clustered cysts (apocrine metaplasia) in the left breast at 2
o'clock 4 cm from the nipple measuring 5 x 3 x 7 mm. On the prior
ultrasound dated [DATE] it measured 5 x 3 x 7 mm.
IMPRESSION: Stable probable benign apocrine metaplasia in the left breast.

RECOMMENDATION:
Bilateral diagnostic mammogram and left breast ultrasound in [DATE] is recommended.

I have discussed the findings and recommendations with the patient.
If applicable, a reminder letter will be sent to the patient
regarding the next appointment.

BI-RADS CATEGORY  3: Probably benign.

## 2019-02-19 ENCOUNTER — Telehealth: Payer: Self-pay | Admitting: Adult Health

## 2019-02-19 NOTE — Telephone Encounter (Signed)
Patient called states she had UA screening yesterday @ employer &  blood found in her urine after testing for poss UTI (results came back negative)--  Patient was given some muscle relaxer's & advised to contact PCP--- advised pt provider out of office on Fridays & would forward message for review & that it may be Monday before she receives a response.  --Forwarding message to med asst/provider for review.  --glh

## 2019-02-22 NOTE — Telephone Encounter (Signed)
02/22/2019  Pt advised that she will need OV to evaluate and discuss.  Tiajuana Amass, CMA

## 2019-02-23 ENCOUNTER — Encounter: Payer: Self-pay | Admitting: Adult Health

## 2019-02-23 ENCOUNTER — Other Ambulatory Visit: Payer: Self-pay

## 2019-02-23 ENCOUNTER — Ambulatory Visit (INDEPENDENT_AMBULATORY_CARE_PROVIDER_SITE_OTHER): Payer: BC Managed Care – PPO | Admitting: Adult Health

## 2019-02-23 VITALS — BP 130/76 | HR 69 | Temp 99.5°F | Ht 60.25 in | Wt 143.9 lb

## 2019-02-23 DIAGNOSIS — R319 Hematuria, unspecified: Secondary | ICD-10-CM | POA: Insufficient documentation

## 2019-02-23 LAB — POCT URINALYSIS DIPSTICK
Bilirubin, UA: NEGATIVE
Glucose, UA: NEGATIVE
Ketones, UA: NEGATIVE
Leukocytes, UA: NEGATIVE
Nitrite, UA: NEGATIVE
Protein, UA: NEGATIVE
Spec Grav, UA: >=1.03 — AB (ref 1.010–1.025)
Urobilinogen, UA: 0.2 E.U./dL
pH, UA: 6.5 (ref 5.0–8.0)

## 2019-02-23 MED ORDER — HYDROCODONE-IBUPROFEN 5-200 MG PO TABS: 1.0000 | ORAL_TABLET | Freq: Three times a day (TID) | ORAL | 0 refills | Status: DC | PRN

## 2019-02-23 NOTE — Patient Instructions (Addendum)
Kidney Stones  Kidney stones are solid, rock-like deposits that form inside of the kidneys. The kidneys are a pair of organs that make urine. A kidney stone may form in a kidney and move into other parts of the urinary tract, including the tubes that connect the kidneys to the bladder (ureters), the bladder, and the tube that carries urine out of the body (urethra). As the stone moves through these areas, it can cause intense pain and block the flow of urine. Kidney stones are created when high levels of certain minerals are found in the urine. The stones are usually passed out of the body through urination, but in some cases, medical treatment may be needed to remove them. What are the causes? Kidney stones may be caused by:  A condition in which certain glands produce too much parathyroid hormone (primary hyperparathyroidism), which causes too much calcium buildup in the blood.  A buildup of uric acid crystals in the bladder (hyperuricosuria). Uric acid is a chemical that the body produces when you eat certain foods. It usually exits the body in the urine.  Narrowing (stricture) of one or both of the ureters.  A kidney blockage that is present at birth (congenital obstruction).  Past surgery on the kidney or the ureters, such as gastric bypass surgery. What increases the risk? The following factors may make you more likely to develop this condition:  Having had a kidney stone in the past.  Having a family history of kidney stones.  Not drinking enough water.  Eating a diet that is high in protein, salt (sodium), or sugar.  Being overweight or obese. What are the signs or symptoms? Symptoms of a kidney stone may include:  Pain in the side of the abdomen, right below the ribs (flank pain). Pain usually spreads (radiates) to the groin.  Needing to urinate frequently or urgently.  Painful urination.  Blood in the urine (hematuria).  Nausea.  Vomiting.  Fever and chills. How  is this diagnosed? This condition may be diagnosed based on:  Your symptoms and medical history.  A physical exam.  Blood tests.  Urine tests. These may be done before and after the stone passes out of your body through urination.  Imaging tests, such as a CT scan, abdominal X-ray, or ultrasound.  A procedure to examine the inside of the bladder (cystoscopy). How is this treated? Treatment for kidney stones depends on the size, location, and makeup of the stones. Kidney stones will often pass out of the body through urination. You may need to:  Increase your fluid intake to help pass the stone. In some cases, you may be given fluids through an IV and may need to be monitored at the hospital.  Take medicine for pain.  Make changes in your diet to help prevent kidney stones from coming back. Sometimes, medical procedures are needed to remove a kidney stone. This may involve:  A procedure to break up kidney stones using: ? A focused beam of light (laser therapy). ? Shock waves (extracorporeal shock wave lithotripsy).  Surgery to remove kidney stones. This may be needed if you have severe pain or have stones that block your urinary tract. Follow these instructions at home: Medicines  Take over-the-counter and prescription medicines only as told by your health care provider.  Ask your health care provider if the medicine prescribed to you requires you to avoid driving or using heavy machinery. Eating and drinking  Drink enough fluid to keep your urine pale yellow.   You may be instructed to drink at least 8-10 glasses of water each day. This will help you pass the kidney stone.  If directed, change your diet. This may include: ? Limiting how much sodium you eat. ? Eating more fruits and vegetables. ? Limiting how much animal protein--such as red meat, poultry, fish, and eggs--you eat.  Follow instructions from your health care provider about eating or drinking  restrictions. General instructions  Collect urine samples as told by your health care provider. You may need to collect a urine sample: ? 24 hours after you pass the stone. ? 8-12 weeks after passing the kidney stone, and every 6-12 months after that.  Strain your urine every time you urinate, for as long as directed. Use the strainer that your health care provider recommends.  Do not throw out the kidney stone after passing it. Keep the stone so it can be tested by your health care provider. Testing the makeup of your kidney stone may help prevent you from getting kidney stones in the future.  Keep all follow-up visits as told by your health care provider. This is important. You may need follow-up X-rays or ultrasounds to make sure that your stone has passed. How is this prevented? To prevent another kidney stone:  Drink enough fluid to keep your urine pale yellow. This is the best way to prevent kidney stones.  Eat a healthy diet and follow recommendations from your health care provider about foods to avoid. You may be instructed to eat a low-protein diet. Recommendations vary depending on the type of kidney stone that you have.  Maintain a healthy weight. Where to find more information  National Kidney Foundation (NKF): www.kidney.org  [facility]): www.urologyhealth.org Contact a health care provider if:  You have pain that gets worse or does not get better with medicine. Get help right away if:  You have a fever or chills.  You develop severe pain.  You develop new abdominal pain.  You faint.  You are unable to urinate. Summary  Kidney stones are solid, rock-like deposits that form inside of the kidneys.  Kidney stones can cause nausea, vomiting, blood in the urine, abdominal pain, and the urge to urinate frequently.  Treatment for kidney stones depends on the size, location, and makeup of the stones. Kidney stones will often pass out of the body  through urination.  Kidney stones can be prevented by drinking enough fluids, eating a healthy diet, and maintaining a healthy weight. This information is not intended to replace advice given to you by your health care provider. Make sure you discuss any questions you have with your health care provider. Document Revised: 05/26/2018 Document Reviewed: 05/26/2018 Elsevier Patient Education  2020 [employer].   Urinalysis demonstrates blood in your urine plus your flank pain- Imaging ordered. Increase water.  Alternate OTC Acetaminophen and OTC Ibuprofen during daytime. If needed, [place] at bedtime for severe pain. We will contact you with lab results. Continue to social distance and wear a mask when in public. FEEL BETTER!

## 2019-02-23 NOTE — Progress Notes (Signed)
Subjective:    Patient ID: Monica Reed, female    DOB: [birth year], 49 y.o.   MRN: [id number]  HPI:  [name] presents with microscopic hematuria and R Flank pain. She reports cleaning her house (as usual) 02/06/2019. She rose from the couch and experienced a sharp pain over her R Flank area. She was seen by her employer provider- UA demonstrated hematuria. She was given muscle relaxer and Macrobid. Urine C/S- negative for UTI- Macrobid stopped. She continues to experience R Flank pain- intermittent, dull ache, rated 5/10. She denies acute accident/injury prior to onset of sx's. She denies this ever occurring prior. She denies hx of nephrolithiasis. She denies abdominal pain. She denies fever/night sweats/N/V/D/change in appetite. She denies urinary sx's.  CBC/CMP drawn today  Patient Care Team    Relationship Specialty Notifications Start End  Mina Marble D, NP PCP - General Family Medicine  06/03/17   Bobbye Charleston, MD Consulting Physician Obstetrics and Gynecology  06/03/17     Patient Active Problem List   Diagnosis Date Noted  . Hematuria 02/23/2019  . Elevated LDL cholesterol level 09/29/2018  . Otalgia of right ear 09/09/2018  . Acute rhinosinusitis 09/09/2018  . Mass of arm, right 06/23/2018  . Strain of gastrocnemius muscle of left lower extremity 09/24/2017  . BMI 26.0-26.9,adult 09/02/2017  . Healthcare maintenance 06/03/2017  . Migraine 05/29/2017  . Anxiety 12/23/2014  . Hypertension 03/26/2013     Past Medical History:  Diagnosis Date  . Frequent UTI   . Hypertension      History reviewed. No pertinent surgical history.   Family History  Problem Relation Age of Onset  . Arthritis Mother   . Cancer Father        prostate  . Heart attack Father   . Healthy Sister   . Healthy Brother   . Hypertension Daughter   . Healthy Brother   . Healthy Daughter   . Healthy Daughter      Social History   Substance and Sexual Activity   Drug Use No     Social History   Substance and Sexual Activity  Alcohol Use No     Social History   Tobacco Use  Smoking Status Never Smoker  Smokeless Tobacco Never Used     Outpatient Encounter Medications as of 02/23/2019  Medication Sig  . FLUoxetine (PROZAC) 40 MG capsule Take 1 capsule (40 mg total) by mouth daily.  Marland Kitchen lisinopril-hydrochlorothiazide (ZESTORETIC) 20-12.5 MG tablet Take 1 tablet by mouth daily.  Marland Kitchen loratadine (CLARITIN) 10 MG tablet Take 10 mg by mouth daily.  . norethindrone-ethinyl estradiol (JUNEL FE 1/20) 1-20 MG-MCG tablet Take 1 tablet by mouth daily.  . hydrocodone-ibuprofen (VICOPROFEN) 5-200 MG tablet Take 1 tablet by mouth every 8 (eight) hours as needed for up to 3 days for pain.   No facility-administered encounter medications on file as of 02/23/2019.    Allergies: Cyclobenzaprine  Body mass index is 27.87 kg/m.  Blood pressure 130/76, pulse 69, temperature 99.5 F (37.5 C), temperature source Oral, height 5' 0.25" (1.53 m), weight 143 lb 14.4 oz (65.3 kg), SpO2 99 %.     Review of Systems  Constitutional: Negative for activity change, appetite change, chills, diaphoresis, fatigue, fever and unexpected weight change.  Respiratory: Negative for cough, chest tightness, shortness of breath and stridor.   Cardiovascular: Negative for chest pain, palpitations and leg swelling.  Endocrine: Negative for polydipsia, polyphagia and polyuria.  Genitourinary: Positive for flank pain. Negative for decreased  urine volume, difficulty urinating, dysuria, hematuria and urgency.  Musculoskeletal: Negative for arthralgias, gait problem, joint swelling, myalgias and neck stiffness.  Neurological: Negative for dizziness and headaches.  Hematological: Negative for adenopathy. Does not bruise/bleed easily.  Psychiatric/Behavioral: Negative for agitation, behavioral problems, confusion, decreased concentration, dysphoric mood, hallucinations, self-injury,  sleep disturbance and suicidal ideas. The patient is not nervous/anxious and is not hyperactive.        Objective:   Physical Exam Vitals and nursing note reviewed.  Constitutional:      General: She is not in acute distress.    Appearance: Normal appearance. She is normal weight. She is not ill-appearing, toxic-appearing or diaphoretic.  Cardiovascular:     Rate and Rhythm: Normal rate and regular rhythm.     Pulses: Normal pulses.     Heart sounds: Normal heart sounds. No murmur. No friction rub. No gallop.   Pulmonary:     Effort: Pulmonary effort is normal. No respiratory distress.     Breath sounds: Normal breath sounds. No wheezing, rhonchi or rales.  Chest:     Chest wall: No tenderness.  Abdominal:     General: Abdomen is flat. Bowel sounds are normal.     Palpations: Abdomen is soft.     Tenderness: There is no abdominal tenderness. There is right CVA tenderness. There is no left CVA tenderness, guarding or rebound. Negative signs include Murphy's sign, Rovsing's sign, McBurney's sign, psoas sign and obturator sign.  Skin:    Capillary Refill: Capillary refill takes less than 2 seconds.  Neurological:     Mental Status: She is alert and oriented to person, place, and time.  Psychiatric:        Mood and Affect: Mood normal.        Behavior: Behavior normal.        Thought Content: Thought content normal.        Judgment: Judgment normal.       Assessment & Plan:   1. Hematuria, unspecified type     Hematuria Urinalysis demonstrates blood in your urine plus your flank pain- CT Renal Stone Study ordered  Increase water.  Alternate OTC Acetaminophen and OTC Ibuprofen during daytime. If needed, Vicoden at bedtime for severe pain. We will contact you with lab results. Continue to social distance and wear a mask when in public.    FOLLOW-UP:  Return if symptoms worsen or fail to improve.

## 2019-02-23 NOTE — Assessment & Plan Note (Signed)
Urinalysis demonstrates blood in your urine plus your flank pain- CT Renal Stone Study ordered  Increase water.  Alternate OTC Acetaminophen and OTC Ibuprofen during daytime. If needed, Vicoden at bedtime for severe pain. We will contact you with lab results. Continue to social distance and wear a mask when in public.

## 2019-02-24 ENCOUNTER — Other Ambulatory Visit: Payer: Self-pay | Admitting: Adult Health

## 2019-02-24 ENCOUNTER — Telehealth: Payer: Self-pay

## 2019-02-24 ENCOUNTER — Telehealth: Payer: Self-pay | Admitting: Adult Health

## 2019-02-24 MED ORDER — HYDROCODONE-IBUPROFEN 7.5-200 MG PO TABS: 1.0000 | ORAL_TABLET | Freq: Two times a day (BID) | ORAL | 0 refills | Status: AC | PRN

## 2019-02-24 NOTE — Telephone Encounter (Signed)
New rx sent in Thanks! Orpha Bur

## 2019-02-24 NOTE — Telephone Encounter (Signed)
Joe from [facility] in [place] called stating that they do not carry the Hydrocodone/ Ibuprofen 5-200mg .  They do carry the 7.5/200mg  and would like to know if they can change her to this dosage.  Please advise.  Tiajuana Amass, CMA

## 2019-02-24 NOTE — Telephone Encounter (Signed)
Patient called to check on the status of her :   CT RENAL STONE STUDY (Order [id number]) Imaging Date: 02/23/2019 Department: [facility] Ordering/Authorizing: Julaine Fusi, NP   --Forwarding message to Patrici Ranks (see order in chart but no update).  -glh

## 2019-02-25 ENCOUNTER — Encounter: Payer: Self-pay | Admitting: Adult Health

## 2019-02-25 LAB — COMPREHENSIVE METABOLIC PANEL
ALT: 11 IU/L (ref 0–32)
AST: 17 IU/L (ref 0–40)
Albumin/Globulin Ratio: 1.5 (ref 1.2–2.2)
Albumin: 4.4 g/dL (ref 3.8–4.8)
Alkaline Phosphatase: 57 IU/L (ref 39–117)
BUN/Creatinine Ratio: 12 (ref 9–23)
BUN: 10 mg/dL (ref 6–24)
Bilirubin Total: 0.3 mg/dL (ref 0.0–1.2)
CO2: 27 mmol/L (ref 20–29)
Calcium: 9.5 mg/dL (ref 8.7–10.2)
Chloride: 102 mmol/L (ref 96–106)
Creatinine, Ser: 0.83 mg/dL (ref 0.57–1.00)
GFR calc Af Amer: 96 mL/min/{1.73_m2} (ref >59)
GFR calc non Af Amer: 84 mL/min/{1.73_m2} (ref >59)
Globulin, Total: 2.9 g/dL (ref 1.5–4.5)
Glucose: 80 mg/dL (ref 65–99)
Potassium: 4.5 mmol/L (ref 3.5–5.2)
Sodium: 142 mmol/L (ref 134–144)
Total Protein: 7.3 g/dL (ref 6.0–8.5)

## 2019-02-25 LAB — CBC
Hematocrit: 43.2 % (ref 34.0–46.6)
Hemoglobin: 15 g/dL (ref 11.1–15.9)
MCH: 31.6 pg (ref 26.6–33.0)
MCHC: 34.7 g/dL (ref 31.5–35.7)
MCV: 91 fL (ref 79–97)
Platelets: 373 10*3/uL (ref 150–450)
RBC: 4.75 x10E6/uL (ref 3.77–5.28)
RDW: 12.6 % (ref 11.7–15.4)
WBC: 8 10*3/uL (ref 3.4–10.8)

## 2019-03-08 ENCOUNTER — Ambulatory Visit
Admission: RE | Admit: 2019-03-08 | Discharge: 2019-03-08 | Disposition: A | Discharge status: Home / Self Care | Payer: BC Managed Care – PPO | Source: Ambulatory Visit | Attending: Adult Health | Admitting: Adult Health

## 2019-03-08 DIAGNOSIS — R319 Hematuria, unspecified: Secondary | ICD-10-CM

## 2019-03-08 DIAGNOSIS — R109 Unspecified abdominal pain: Secondary | ICD-10-CM | POA: Diagnosis not present

## 2019-03-08 IMAGING — CT CT RENAL STONE PROTOCOL
2 of 4 series · 12 of 46 positions shown, 14 images · non-contrast
Comparison: [DATE]

CLINICAL DATA: Right flank pain. Microhematuria.

EXAM:
CT ABDOMEN AND PELVIS WITHOUT CONTRAST
TECHNIQUE: Multidetector CT imaging of the abdomen and pelvis was performed
following the standard protocol without IV contrast.

[Series 2: renal stone 5.00 br40 s3 axial · axial · 0.56mm/px · z∈[+1176,+1561]mm · 9 of 92 slices shown, 11 images]
[im 8/92  soft-tissue]
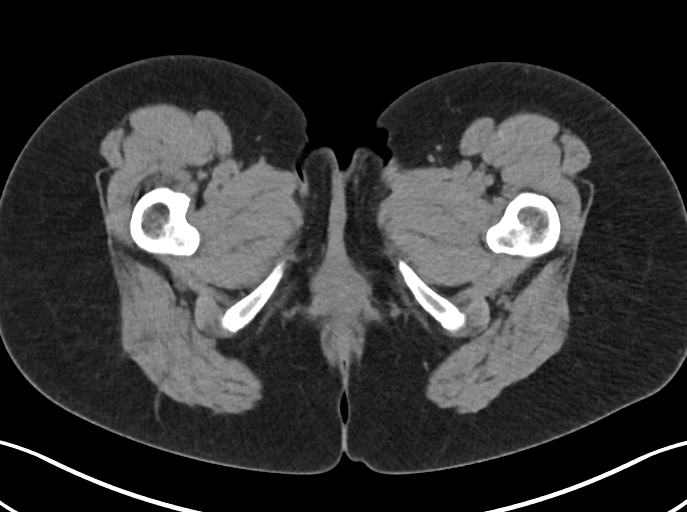
[im 8/92  bone]
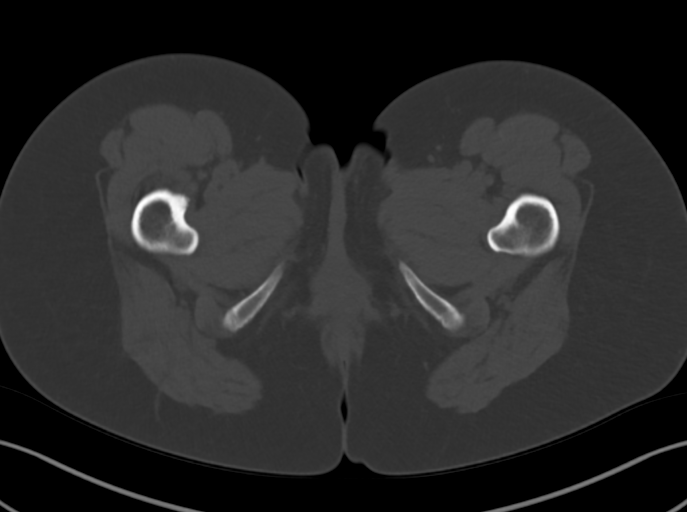
[im 18/92  soft-tissue]
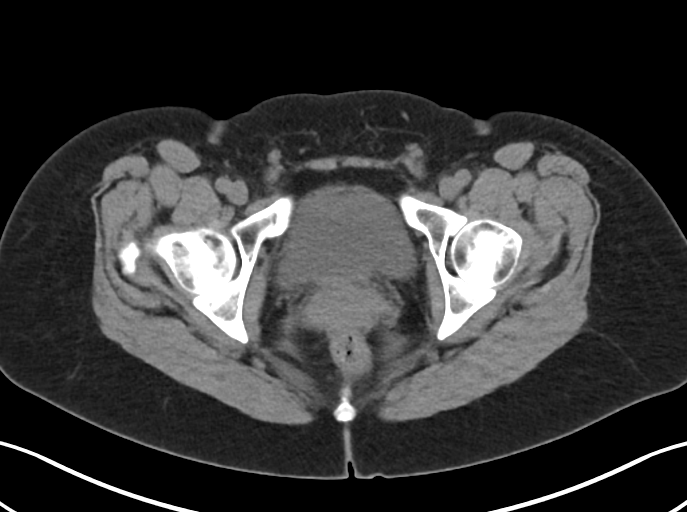
[im 25/92  soft-tissue]
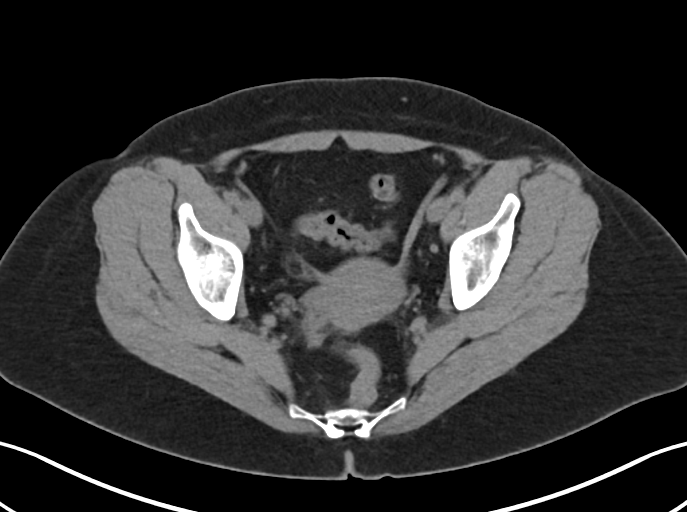
[im 36/92  soft-tissue]
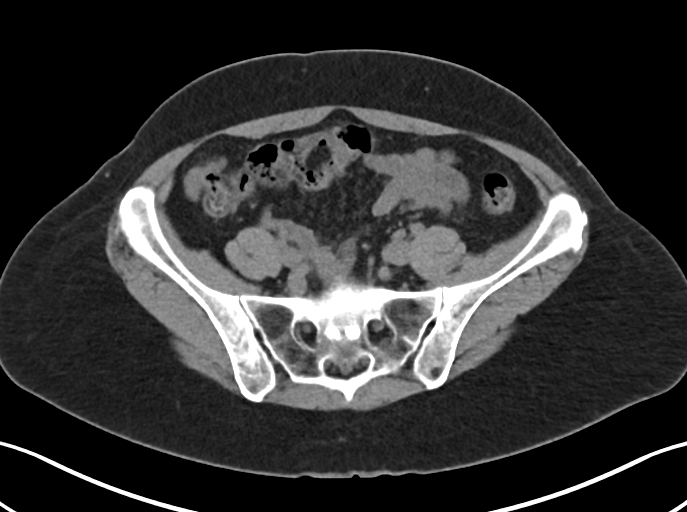
[im 46/92  soft-tissue]
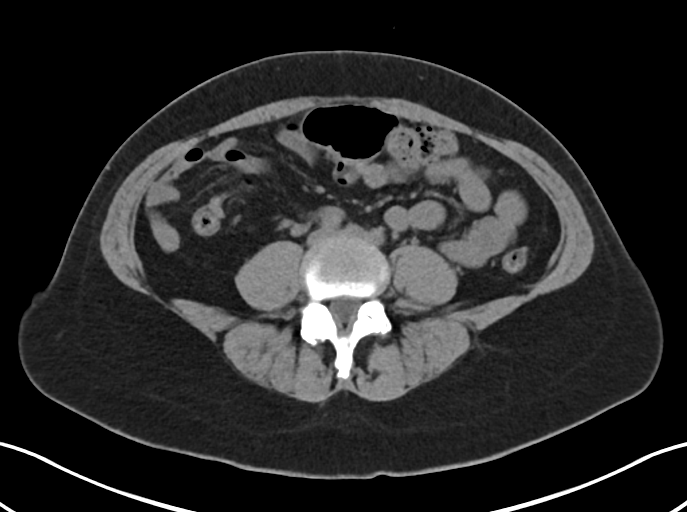
[im 57/92  soft-tissue]
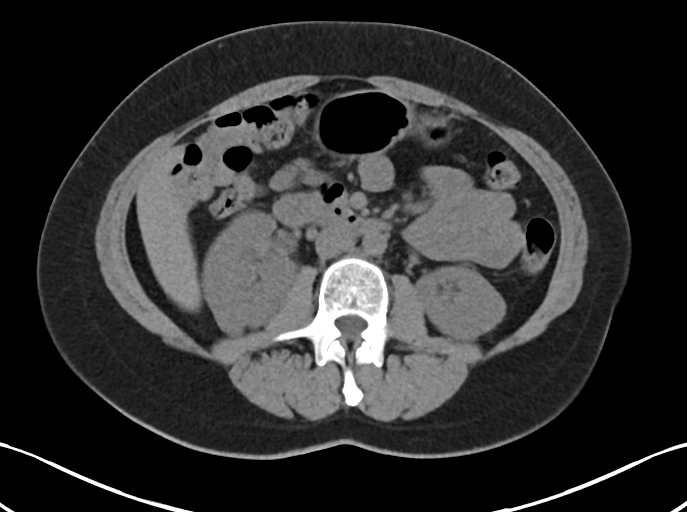
[im 67/92  soft-tissue]
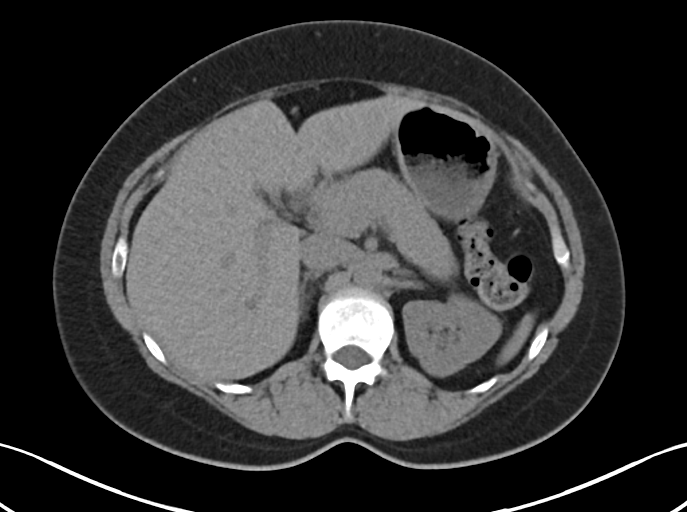
[im 74/92  soft-tissue]
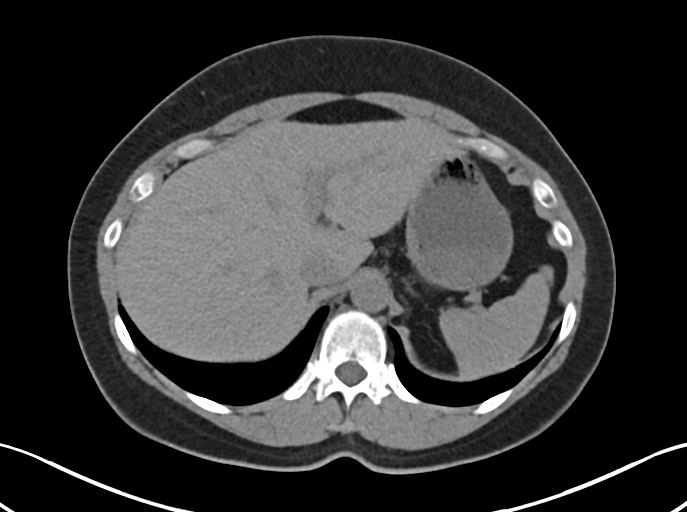
[im 85/92  soft-tissue]
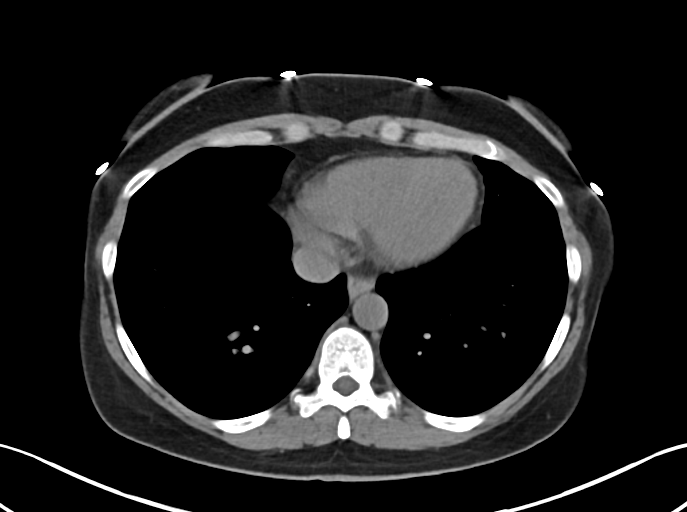
[im 85/92  bone]
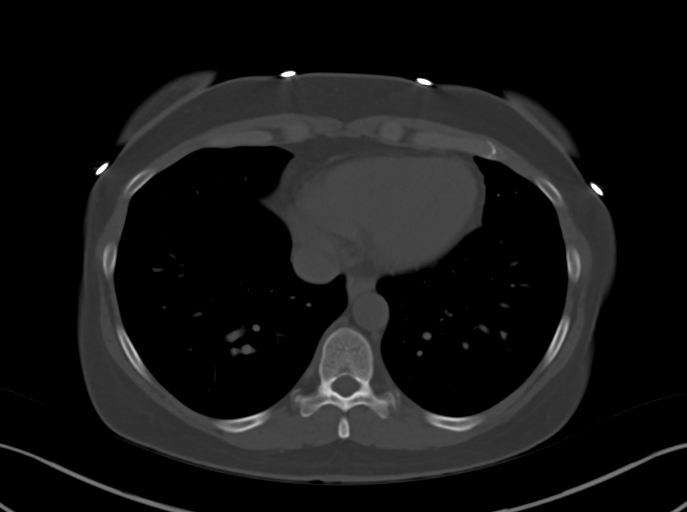

[Series 6: renal stone 2.00 br40 s3 cor · coronal · 0.75mm/px · 3 of 141 slices shown]
[im 47/141  soft-tissue]
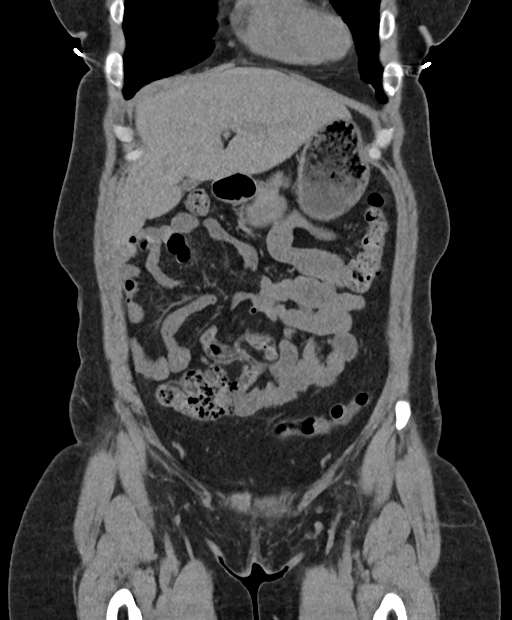
[im 63/141  soft-tissue]
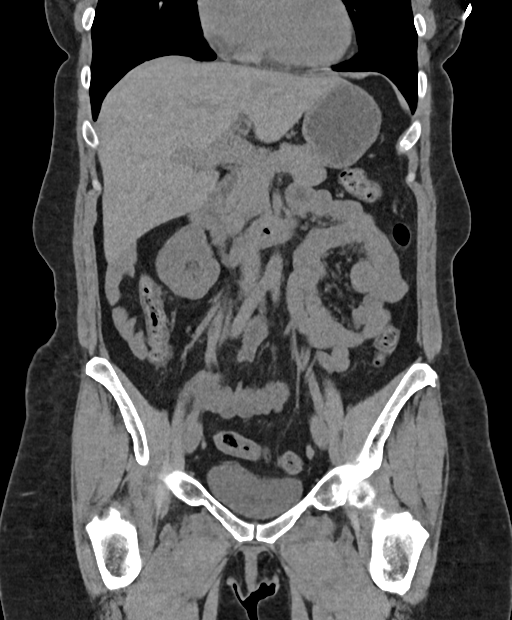
[im 78/141  soft-tissue]
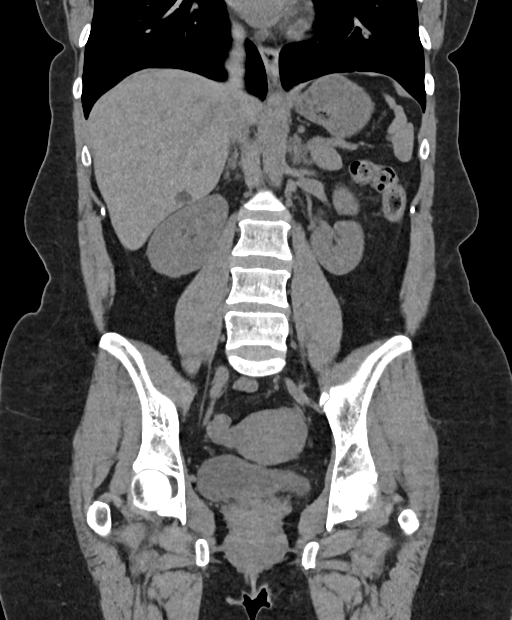

[12 of 46 positions shown; findings below may reference images not displayed]

FINDINGS: Lower chest: Unremarkable

Hepatobiliary: Hypodense 0.6 by 0.7 cm lesion in the left hepatic
lobe on image [DATE], not well-defined on the prior exam from
[DATE].

Stable lateral segment left hepatic lobe 0.7 by 0.4 cm lesion, image
[DATE].

In segment 3, a 0.8 by 0.4 cm hypodense lesion is present and is
faintly seen on the prior exam.

In segment 7 of the liver, a 0.6 cm hypodense lesion on image [DATE]
is present, faintly visible on the prior exam.

A sharply defined fluid density lesion in segment 6 of the liver on
image [DATE] measures 1.6 by 1.4 cm, stable. Other smaller right
hepatic lobe lesions likewise are again noted.

Contracted gallbladder. No findings of biliary dilatation.

Pancreas: Unremarkable

Spleen: Unremarkable

Adrenals/Urinary Tract: No urinary tract calculi, hydronephrosis, or
hydroureter. The adrenal glands and urinary bladder appear
unremarkable.

Stomach/Bowel: Somewhat mobile cecum extends to the midline. Fatty
ileocecal valve. A candidate structure for appendix appears
unremarkable.

Vascular/Lymphatic: Unremarkable

Reproductive: Unremarkable

Other: Focal rim density and some central density in a fatty lesion
just above the anterior urinary bladder, new compared to the prior
exam, and measuring about 2.6 by 1.1 by 2.2 cm. This is not in a
characteristic position for a urachal remnant. Possibilities include
a postinflammatory lesion or a prior small omental infarct along the
lower margin of the omentum.

Musculoskeletal: Mild sclerosis along the sacroiliac joints favoring
low-grade sacroiliac joint arthropathy.
IMPRESSION: 1. No urinary tract calculi or hydronephrosis.
2. Several small hypodense lesions in the liver are technically
nonspecific although statistically likely to be benign, and for the
most part is similar to the prior exam.
3. Focal rim density with some central density in a fatty lesion
just above the anterior urinary bladder, not in a characteristic
position for urachal remnant. This could be a postinflammatory
lesion or from a remote small omental infarct.
4. Low-grade sacroiliac joint arthropathy.

## 2019-03-09 ENCOUNTER — Encounter: Payer: Self-pay | Admitting: Adult Health

## 2019-03-11 ENCOUNTER — Other Ambulatory Visit: Payer: Self-pay | Admitting: Family Medicine

## 2019-03-11 ENCOUNTER — Other Ambulatory Visit: Payer: Self-pay | Admitting: Adult Health

## 2019-03-11 DIAGNOSIS — R319 Hematuria, unspecified: Secondary | ICD-10-CM

## 2019-03-25 ENCOUNTER — Other Ambulatory Visit: Payer: Self-pay

## 2019-03-25 DIAGNOSIS — R319 Hematuria, unspecified: Secondary | ICD-10-CM

## 2019-03-25 NOTE — Progress Notes (Signed)
New order placed per request from [facility] to change location of facility.  Tiajuana Amass, CMA

## 2019-04-06 ENCOUNTER — Telehealth: Payer: Self-pay

## 2019-04-06 ENCOUNTER — Other Ambulatory Visit: Payer: Self-pay | Admitting: Family Medicine

## 2019-04-06 DIAGNOSIS — K760 Fatty (change of) liver, not elsewhere classified: Secondary | ICD-10-CM

## 2019-04-06 NOTE — Telephone Encounter (Signed)
Arlene at [facility] called stating that the complete abdominal ultrasound ordered will not evaluate the "fatty lesion just above anterior urinary bladder".  Arlene requested additional order for pelvic limited ultrasound to evaluate this area.  New order placed for [id number] per her request.  Tiajuana Amass, CMA

## 2019-04-09 ENCOUNTER — Other Ambulatory Visit: Payer: Self-pay | Admitting: Family Medicine

## 2019-04-09 DIAGNOSIS — R935 Abnormal findings on diagnostic imaging of other abdominal regions, including retroperitoneum: Secondary | ICD-10-CM

## 2019-04-12 ENCOUNTER — Ambulatory Visit
Admission: RE | Admit: 2019-04-12 | Discharge: 2019-04-12 | Disposition: A | Discharge status: Home / Self Care | Payer: BC Managed Care – PPO | Source: Ambulatory Visit | Attending: Family Medicine | Admitting: Family Medicine

## 2019-04-12 DIAGNOSIS — K7689 Other specified diseases of liver: Secondary | ICD-10-CM | POA: Diagnosis not present

## 2019-04-12 DIAGNOSIS — R935 Abnormal findings on diagnostic imaging of other abdominal regions, including retroperitoneum: Secondary | ICD-10-CM

## 2019-04-12 DIAGNOSIS — R319 Hematuria, unspecified: Secondary | ICD-10-CM

## 2019-04-12 DIAGNOSIS — N3289 Other specified disorders of bladder: Secondary | ICD-10-CM | POA: Diagnosis not present

## 2019-04-12 IMAGING — US US ABDOMEN COMPLETE
2 series · 13 of 25 positions shown · non-contrast
Comparison: [DATE] unenhanced CT abdomen/pelvis.

CLINICAL DATA: Small low-attenuation liver lesions on recent CT.

EXAM:
ABDOMEN ULTRASOUND COMPLETE

[Series 1: us abdomen complete · 0.23mm/px · 12 of 92 slices shown (1 of 2)]
[im 1/92]
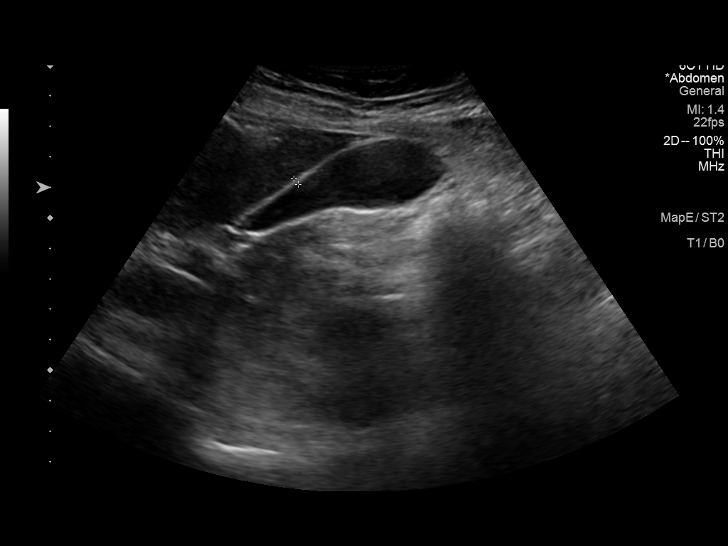
[im 8/92]
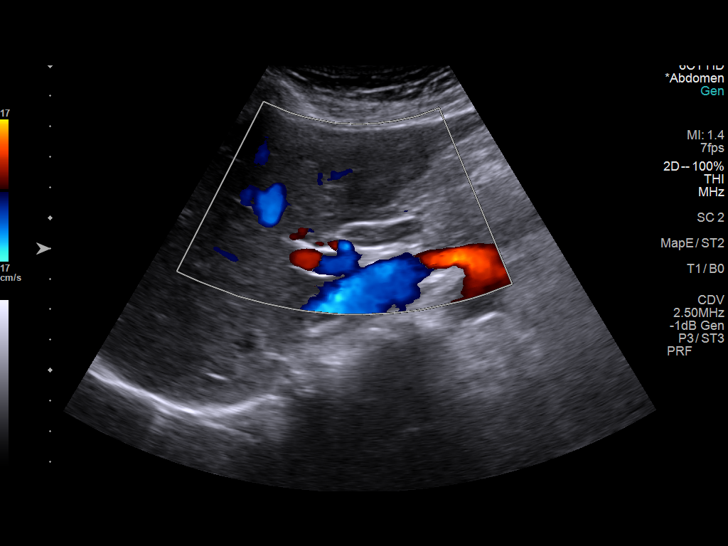
[im 16/92]
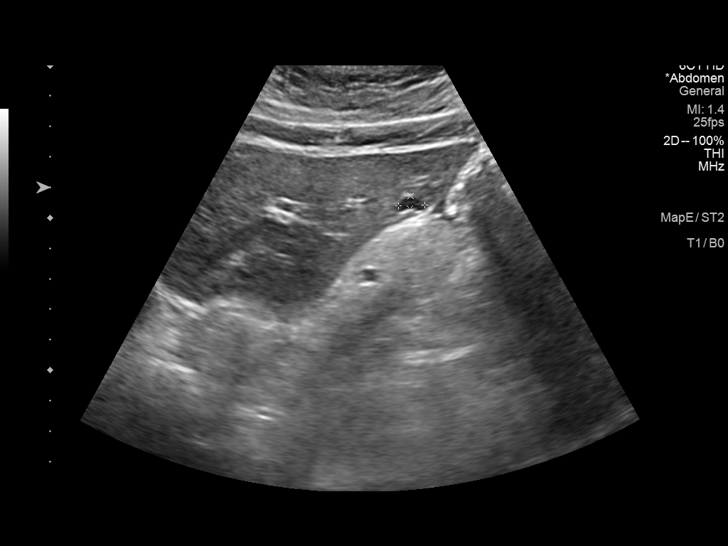
[im 24/92]
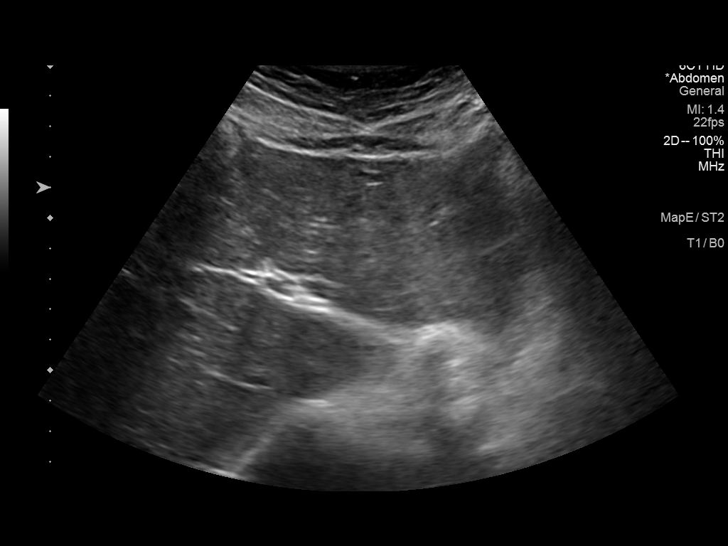
[im 32/92]
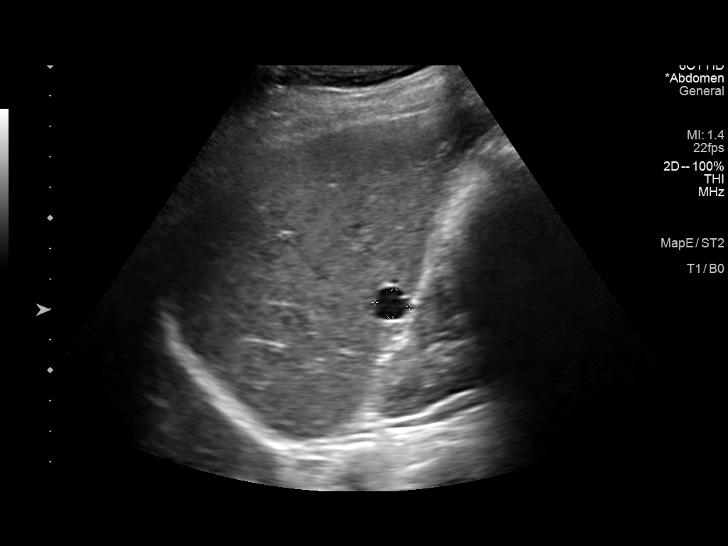
[im 40/92]
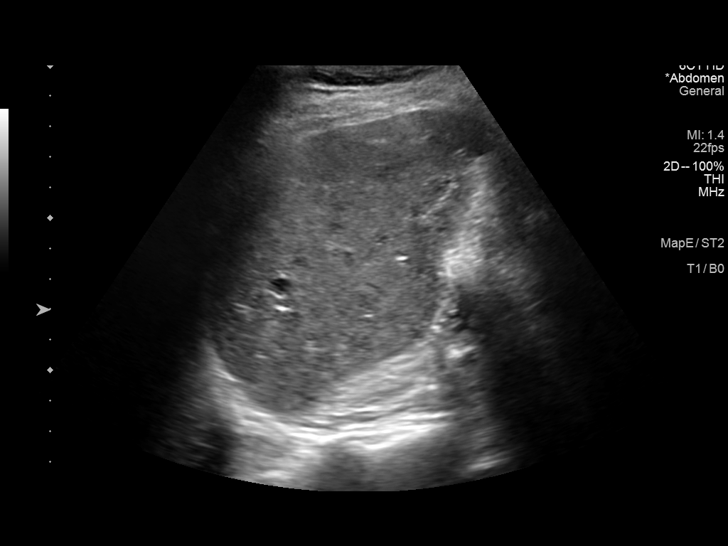
[im 48/92]
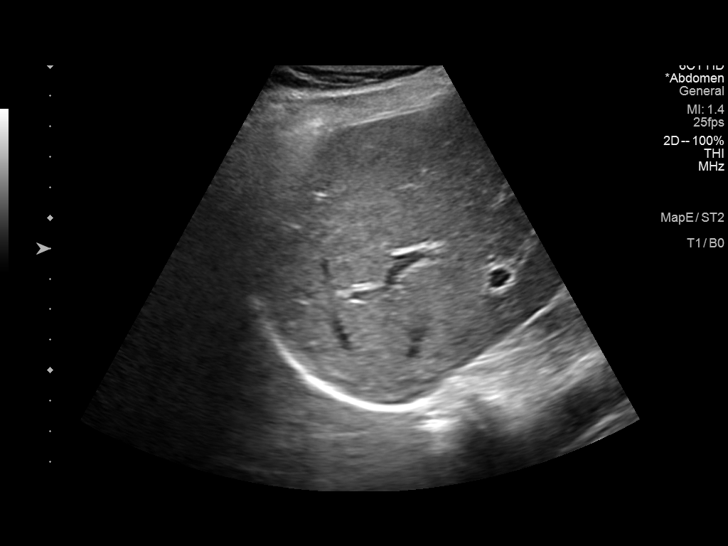
[im 56/92]
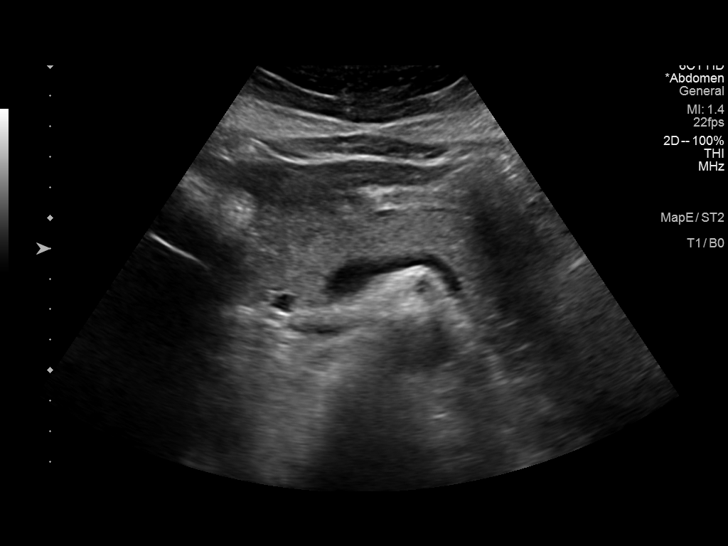
[im 64/92]
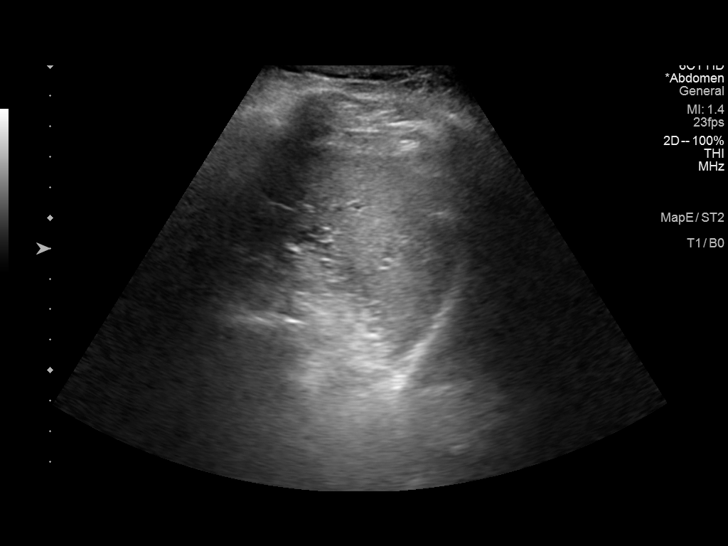
[im 72/92]
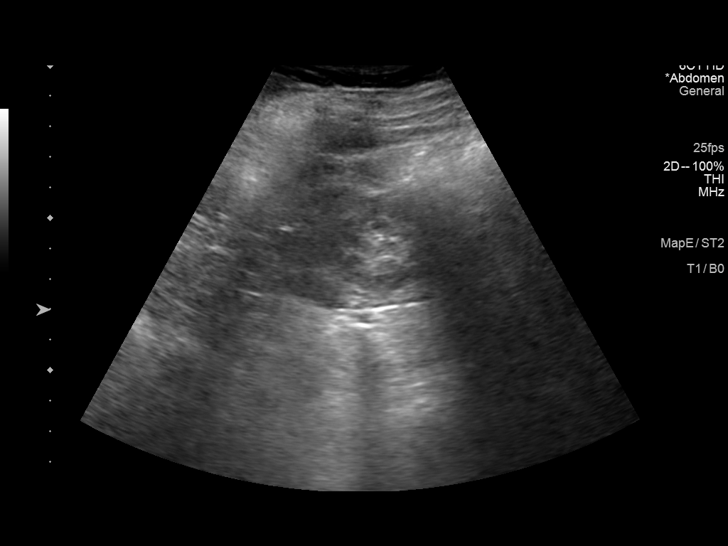
[im 80/92]
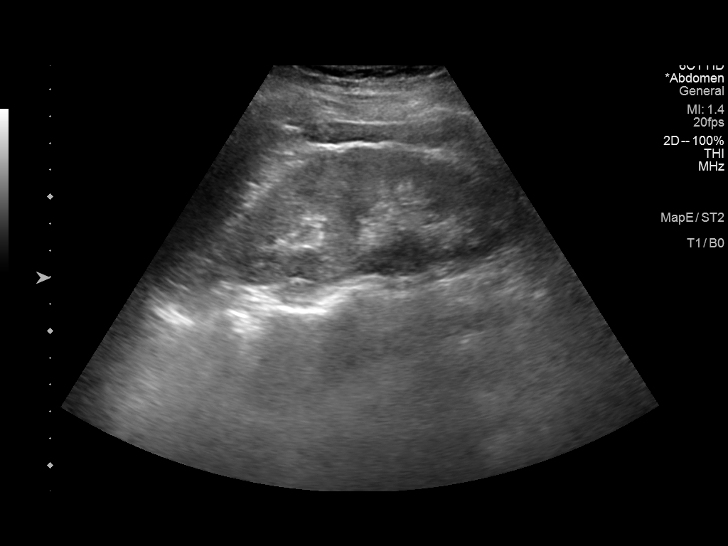
[im 88/92]
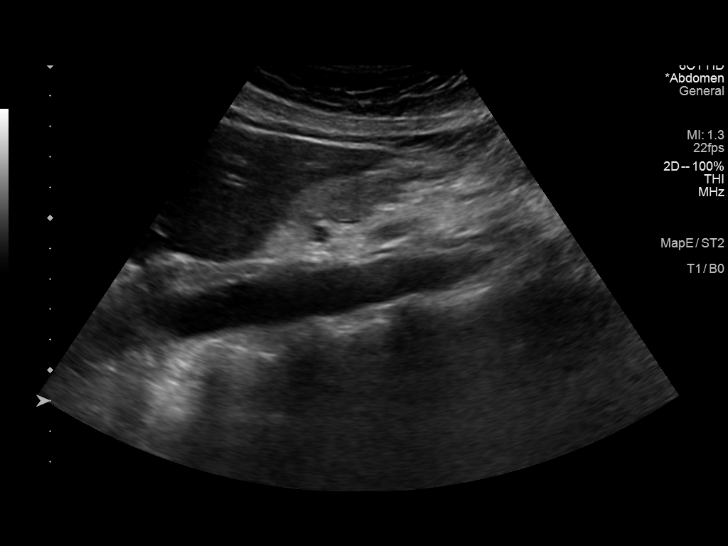

[Series 2001: us abdomen complete · 0.23mm/px · 1 of 4 slices shown (2 of 2)]
[im 1/4]
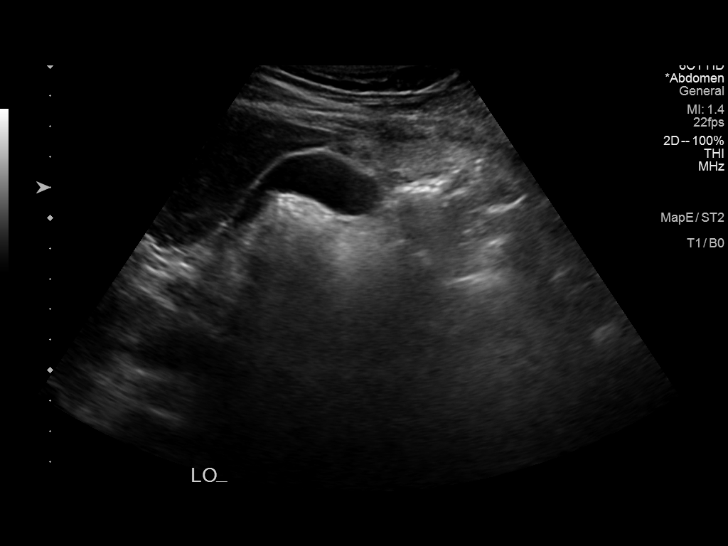

[13 of 25 positions shown; findings below may reference images not displayed]

FINDINGS: Gallbladder: No gallstones or wall thickening visualized. No
sonographic Murphy sign noted by sonographer.

Common bile duct: Diameter: 6 mm, top-normal.

Liver: Several simple small liver cysts scattered throughout the
liver, largest 1.1 x 1.1 x 1.7 cm in the inferior right liver and6
0.9 x 0.4 x 1.2 cm in the lateral segment left liver lobe. No
suspicious liver masses. Background liver parenchymal echogenicity
and echotexture are within normal limits. Portal vein is patent on
color Doppler imaging with normal direction of blood flow towards
the liver.

IVC: No abnormality visualized.

Pancreas: Visualized portion unremarkable.

Spleen: Size and appearance within normal limits.

Right Kidney: Length: 10.8 cm. Echogenicity within normal limits. No
mass or hydronephrosis visualized.

Left Kidney: Length: 11.4 cm. Echogenicity within normal limits. No
mass or hydronephrosis visualized.

Abdominal aorta: No aneurysm visualized.

Other findings: None.
IMPRESSION: 1. Small simple liver cysts.  No suspicious liver masses.
2. No acute abnormality.

## 2019-04-12 IMAGING — US US PELVIS LIMITED
3 series · 14 of 14 positions shown · non-contrast
Comparison: [DATE] CT abdomen/pelvis.

CLINICAL DATA: Fat containing lesion in close proximity to the
urinary bladder on recent unenhanced CT study.

EXAM:
LIMITED ULTRASOUND OF PELVIS
TECHNIQUE: Limited transabdominal ultrasound examination of the pelvis was
performed.

[Series 1: us pelvis limited · 0.10mm/px · 2 of 2 slices shown (1 of 3)]
[im 1/2]
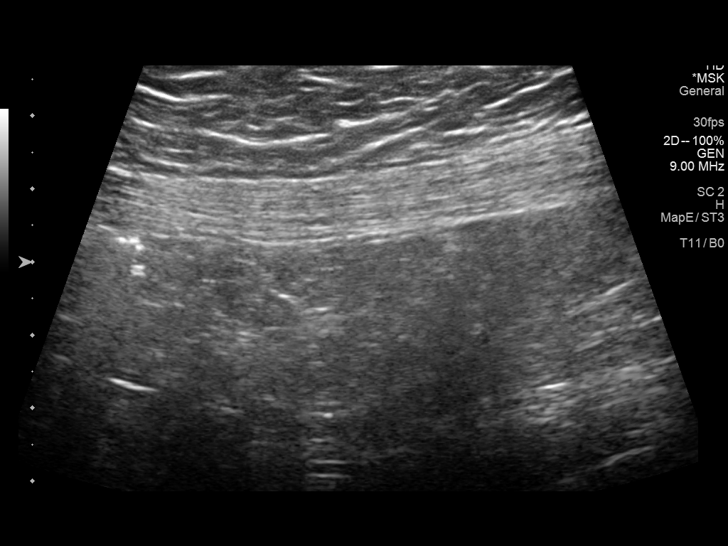
[im 2/2]
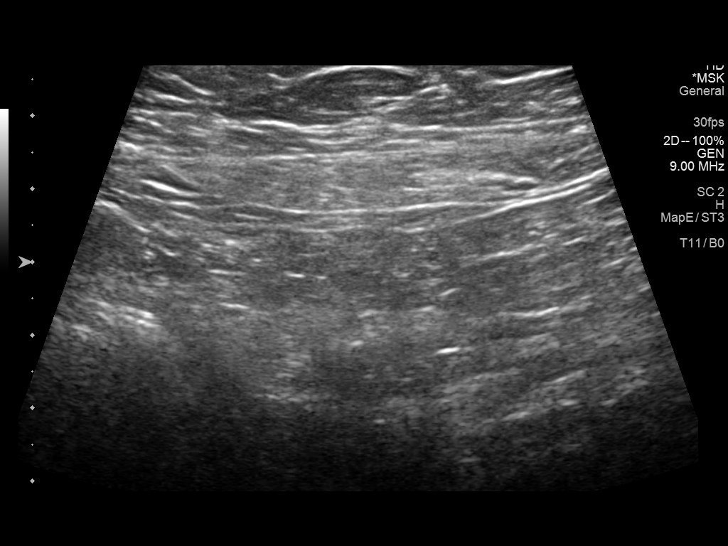

[Series 2001: us pelvis limited · 9 acquisitions, 9 frames shown (2 of 3)]
[im 1/9]
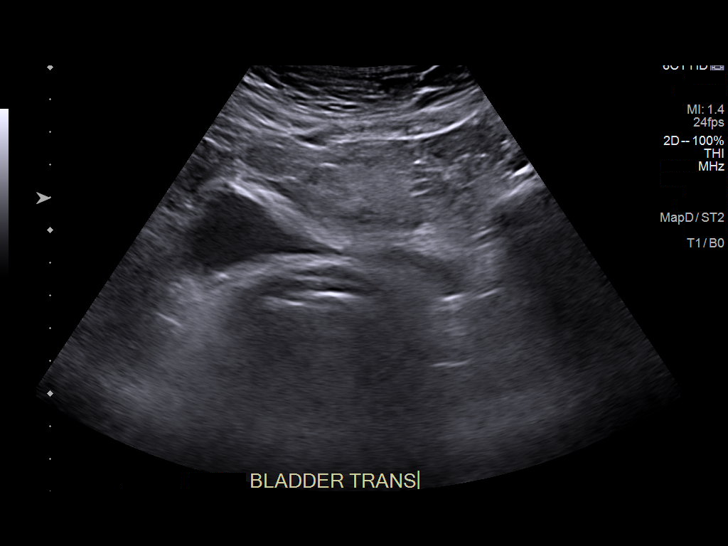
[im 2/9]
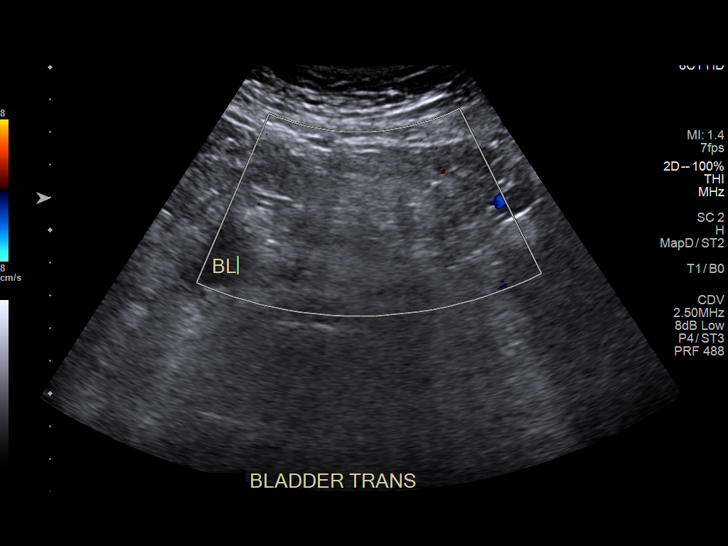
[im 3/9]
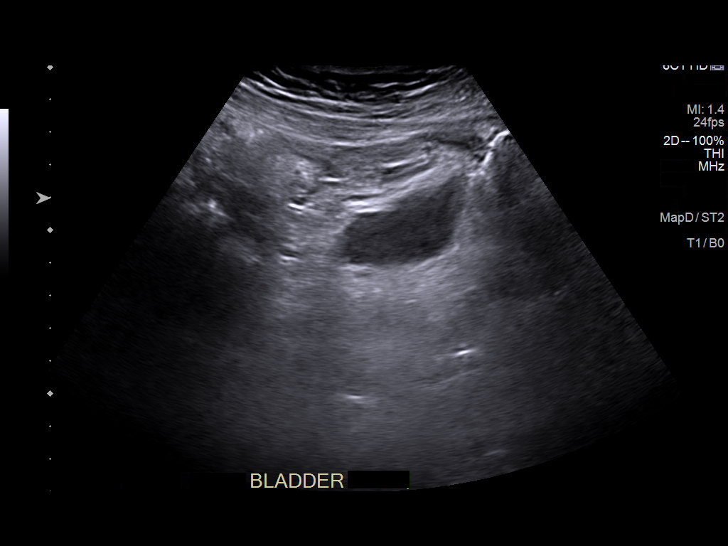
[im 4/9]
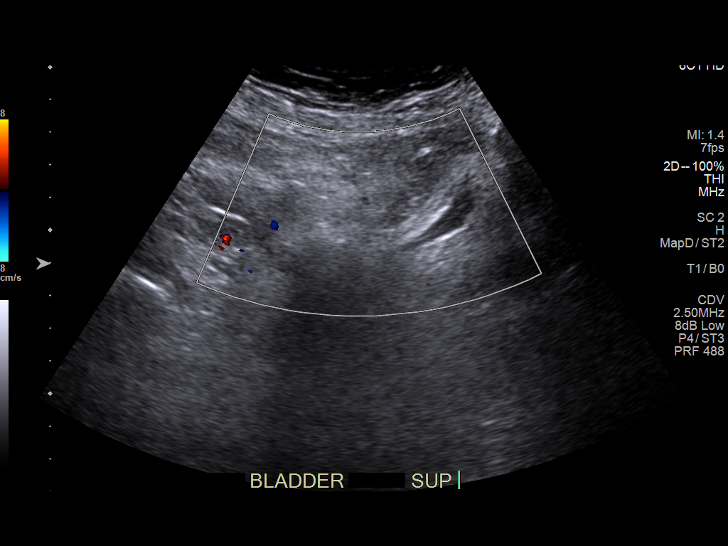
[im 5/9]
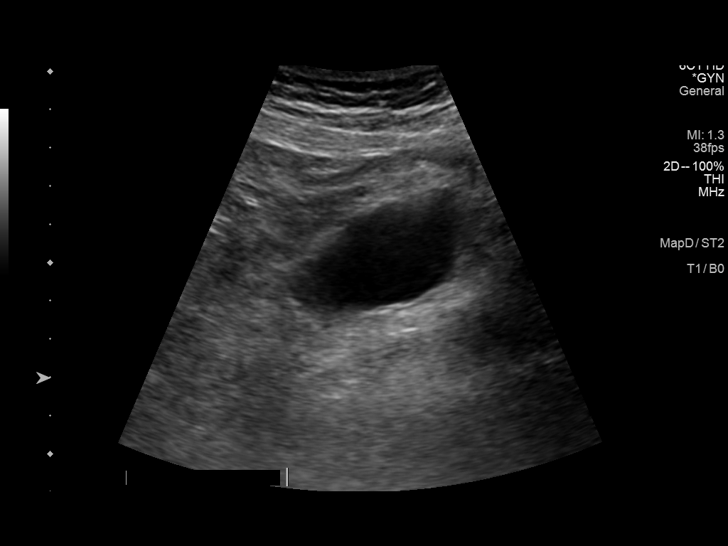
[im 6/9]
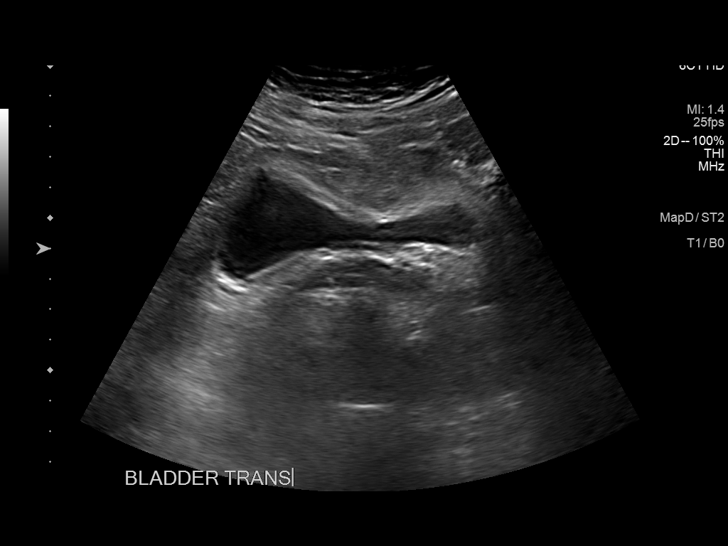
[im 7/9]
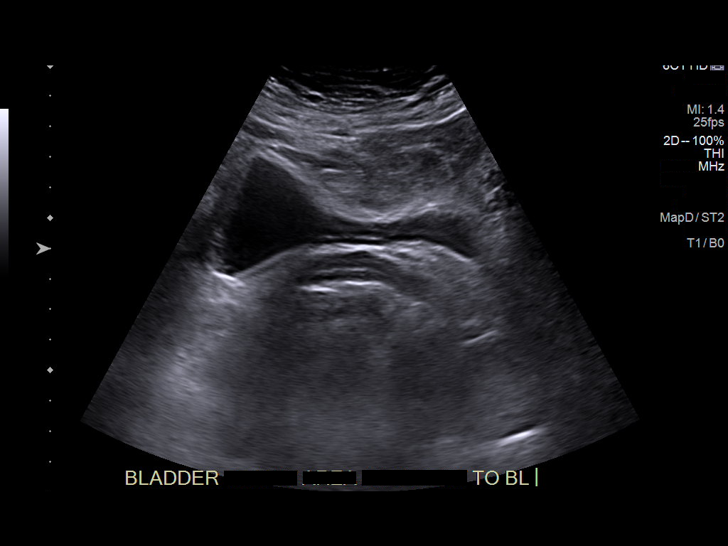
[im 8/9]
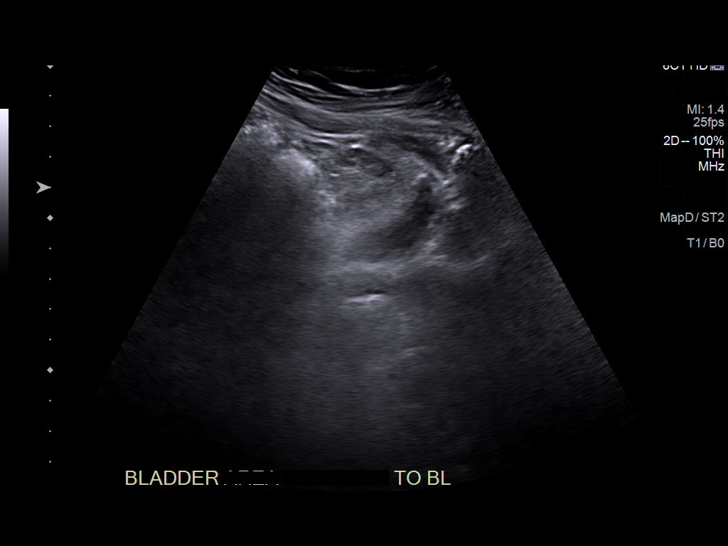
[im 9/9]
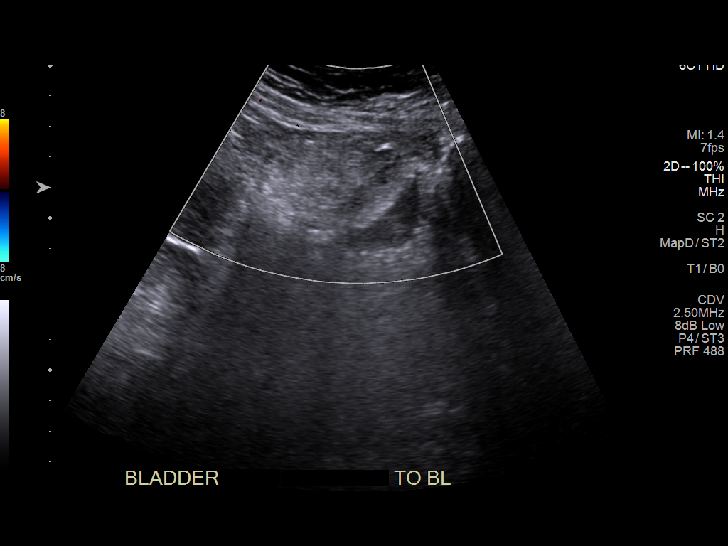

[Series 2002: us pelvis limited · 0.22mm/px · 3 of 3 slices shown (3 of 3)]
[im 1/3]
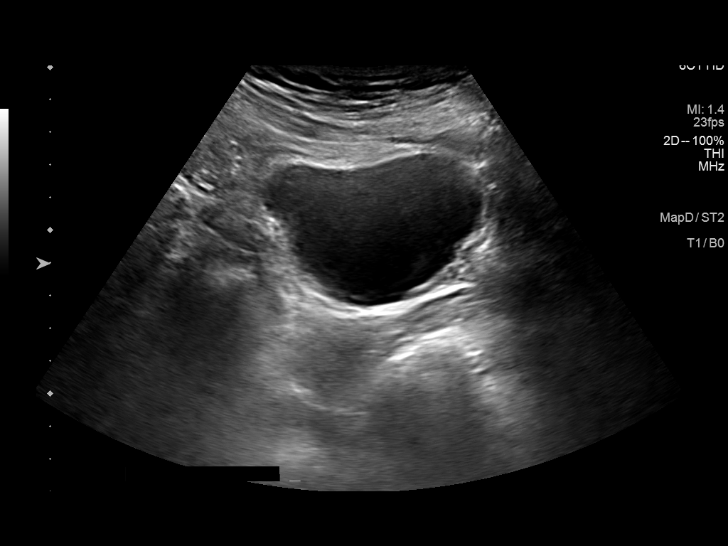
[im 2/3]
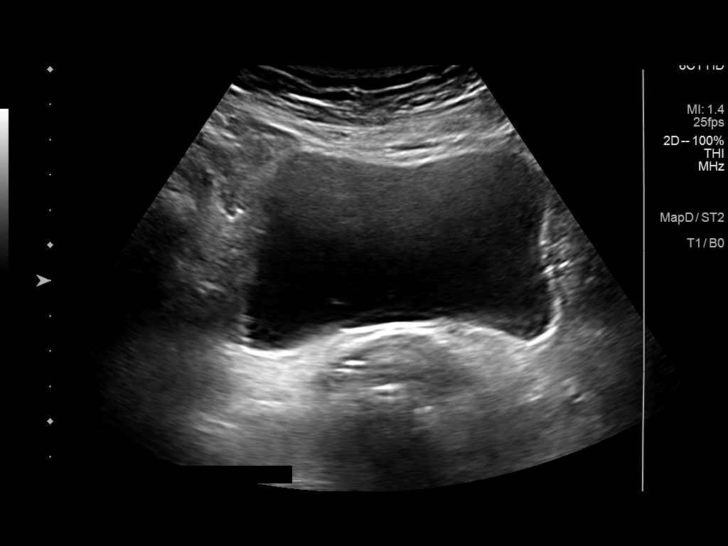
[im 3/3]
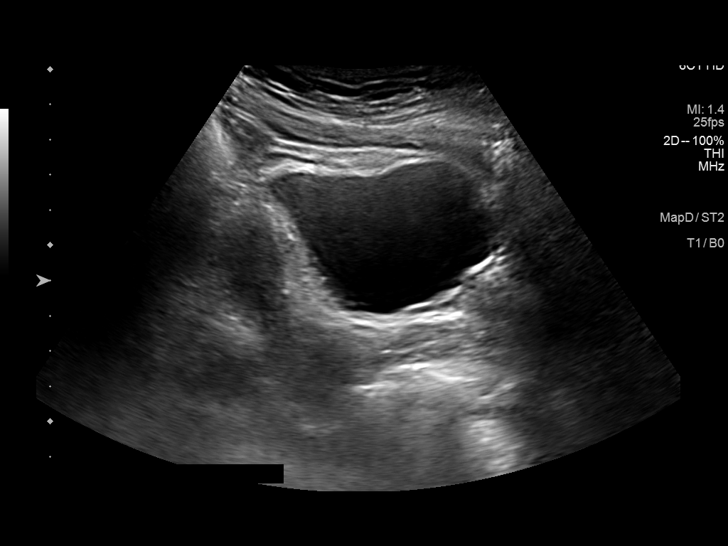

[14 of 14 positions shown; findings below may reference images not displayed]

FINDINGS: Bladder is nondistended and grossly normal. There is heterogeneity
of the fat anterior to the bladder with no discrete mass or fluid
collection by ultrasound.
IMPRESSION: Nondistended grossly normal urinary bladder. Heterogeneity of the
fat anterior to the urinary bladder with no discrete mass or fluid
collection by ultrasound. The ovoid fat containing CT finding
anterior to the urinary bladder described on the [DATE] CT scan
is compatible with epiploic appendagitis in the sigmoid colon of
uncertain chronicity.

## 2019-04-21 ENCOUNTER — Encounter: Payer: Self-pay | Admitting: Urology

## 2019-04-21 ENCOUNTER — Other Ambulatory Visit: Payer: Self-pay

## 2019-04-21 ENCOUNTER — Ambulatory Visit: Payer: BC Managed Care – PPO | Admitting: Urology

## 2019-04-21 VITALS — BP 126/80 | HR 66 | Wt 143.0 lb

## 2019-04-21 DIAGNOSIS — R319 Hematuria, unspecified: Secondary | ICD-10-CM

## 2019-04-21 DIAGNOSIS — R3121 Asymptomatic microscopic hematuria: Secondary | ICD-10-CM | POA: Diagnosis not present

## 2019-04-21 DIAGNOSIS — R109 Unspecified abdominal pain: Secondary | ICD-10-CM | POA: Diagnosis not present

## 2019-04-21 NOTE — Patient Instructions (Signed)
Kegel Exercises  Kegel exercises can help strengthen your pelvic floor muscles. The pelvic floor is a group of muscles that support your rectum, small intestine, and bladder. In females, pelvic floor muscles also help support the womb (uterus). These muscles help you control the flow of urine and stool. Kegel exercises are painless and simple, and they do not require any equipment. Your provider may suggest Kegel exercises to:  Improve bladder and bowel control.  Improve sexual response.  Improve weak pelvic floor muscles after surgery to remove the uterus (hysterectomy) or pregnancy (females).  Improve weak pelvic floor muscles after prostate gland removal or surgery (males). Kegel exercises involve squeezing your pelvic floor muscles, which are the same muscles you squeeze when you try to stop the flow of urine or keep from passing gas. The exercises can be done while sitting, standing, or lying down, but it is best to vary your position. Exercises How to do Kegel exercises: 1. Squeeze your pelvic floor muscles tight. You should feel a tight lift in your rectal area. If you are a female, you should also feel a tightness in your vaginal area. Keep your stomach, buttocks, and legs relaxed. 2. Hold the muscles tight for up to 10 seconds. 3. Breathe normally. 4. Relax your muscles. 5. Repeat as told by your health care provider. Repeat this exercise daily as told by your health care provider. Continue to do this exercise for at least 4-6 weeks, or for as long as told by your health care provider. You may be referred to a physical therapist who can help you learn more about how to do Kegel exercises. Depending on your condition, your health care provider may recommend:  Varying how long you squeeze your muscles.  Doing several sets of exercises every day.  Doing exercises for several weeks.  Making Kegel exercises a part of your regular exercise routine. This information is not intended  to replace advice given to you by your health care provider. Make sure you discuss any questions you have with your health care provider. Document Revised: 08/27/2017 Document Reviewed: 08/27/2017 Elsevier Patient Education  2020 [employer]. Cystoscopy Cystoscopy is a procedure that is used to help diagnose and sometimes treat conditions that affect the lower urinary tract. The lower urinary tract includes the bladder and the urethra. The urethra is the tube that drains urine from the bladder. Cystoscopy is done using a thin, tube-shaped instrument with a light and camera at the end (cystoscope). The cystoscope may be hard or flexible, depending on the goal of the procedure. The cystoscope is inserted through the urethra, into the bladder. Cystoscopy may be recommended if you have:  Urinary tract infections that keep coming back.  Blood in the urine (hematuria).  An inability to control when you urinate (urinary incontinence) or an overactive bladder.  Unusual cells found in a urine sample.  A blockage in the urethra, such as a urinary stone.  Painful urination.  An abnormality in the bladder found during an intravenous pyelogram (IVP) or CT scan. Cystoscopy may also be done to remove a sample of tissue to be examined under a microscope (biopsy). Tell a health care provider about:  Any allergies you have.  All medicines you are taking, including vitamins, herbs, eye drops, creams, and over-the-counter medicines.  Any problems you or family members have had with anesthetic medicines.  Any blood disorders you have.  Any surgeries you have had.  Any medical conditions you have.  Whether you are pregnant  or may be pregnant. What are the risks? Generally, this is a safe procedure. However, problems may occur, including:  Infection.  Bleeding.  Allergic reactions to medicines.  Damage to other structures or organs. What happens before the procedure?  Ask your health care  provider about: ? Changing or stopping your regular medicines. This is especially important if you are taking diabetes medicines or blood thinners. ? Taking medicines such as aspirin and ibuprofen. These medicines can thin your blood. Do not take these medicines unless your health care provider tells you to take them. ? Taking over-the-counter medicines, vitamins, herbs, and supplements.  Follow instructions from your health care provider about eating or drinking restrictions.  Ask your health care provider what steps will be taken to help prevent infection. These may include: ? Washing skin with a germ-killing soap. ? Taking antibiotic medicine.  You may have an exam or testing, such as: ? X-rays of the bladder, urethra, or kidneys. ? Urine tests to check for signs of infection.  Plan to have someone take you home from the hospital or clinic. What happens during the procedure?   You will be given one or more of the following: ? A medicine to help you relax (sedative). ? A medicine to numb the area (local anesthetic).  The area around the opening of your urethra will be cleaned.  The cystoscope will be passed through your urethra into your bladder.  Germ-free (sterile) fluid will flow through the cystoscope to fill your bladder. The fluid will stretch your bladder so that your health care provider can clearly examine your bladder walls.  Your doctor will look at the urethra and bladder. Your doctor may take a biopsy or remove stones.  The cystoscope will be removed, and your bladder will be emptied. The procedure may vary among health care providers and hospitals. What can I expect after the procedure? After the procedure, it is common to have:  Some soreness or pain in your abdomen and urethra.  Urinary symptoms. These include: ? Mild pain or burning when you urinate. Pain should stop within a few minutes after you urinate. This may last for up to 1 week. ? A small amount of  blood in your urine for several days. ? Feeling like you need to urinate but producing only a small amount of urine. Follow these instructions at home: Medicines  Take over-the-counter and prescription medicines only as told by your health care provider.  If you were prescribed an antibiotic medicine, take it as told by your health care provider. Do not stop taking the antibiotic even if you start to feel better. General instructions  Return to your normal activities as told by your health care provider. Ask your health care provider what activities are safe for you.  Do not drive for 24 hours if you were given a sedative during your procedure.  Watch for any blood in your urine. If the amount of blood in your urine increases, call your health care provider.  Follow instructions from your health care provider about eating or drinking restrictions.  If a tissue sample was removed for testing (biopsy) during your procedure, it is up to you to get your test results. Ask your health care provider, or the department that is doing the test, when your results will be ready.  Drink enough fluid to keep your urine pale yellow.  Keep all follow-up visits as told by your health care provider. This is important. Contact a health care provider  if you:  Have pain that gets worse or does not get better with medicine, especially pain when you urinate.  Have trouble urinating.  Have more blood in your urine. Get help right away if you:  Have blood clots in your urine.  Have abdominal pain.  Have a fever or chills.  Are unable to urinate. Summary  Cystoscopy is a procedure that is used to help diagnose and sometimes treat conditions that affect the lower urinary tract.  Cystoscopy is done using a thin, tube-shaped instrument with a light and camera at the end.  After the procedure, it is common to have some soreness or pain in your abdomen and urethra.  Watch for any blood in your urine.  If the amount of blood in your urine increases, call your health care provider.  If you were prescribed an antibiotic medicine, take it as told by your health care provider. Do not stop taking the antibiotic even if you start to feel better. This information is not intended to replace advice given to you by your health care provider. Make sure you discuss any questions you have with your health care provider. Document Revised: 12/30/2017 Document Reviewed: 12/30/2017 Elsevier Patient Education  [employer].

## 2019-04-21 NOTE — Progress Notes (Signed)
   04/21/19 12:26 PM   Modena Nunnery Sharion Dove 02-Apr-1970 [id number]  CC: Microscopic hematuria, flank pain  HPI: I saw Ms. Vanputten in urology clinic for evaluation of microscopic hematuria and flank pain.  She is an otherwise healthy 49 year old female reports a few months of intermittent right-sided flank pain that has generally improved.  She is unable to describe any aggravating or alleviating factors.  She also was found to have reported microscopic hematuria on a urine sample at work.  This prompted a CT stone protocol to evaluate for any ureteral stones.  CT was relatively benign and showed no hydronephrosis or nephrolithiasis, and there was a small fat abnormality anterior to the urinary bladder compatible with an epiploic appendagitis in the sigmoid colon.  A follow-up pelvic/renal ultrasound showed no hydronephrosis or solid renal masses.  She denies any gross hematuria or bothersome urinary symptoms.  She is a never smoker and denies any other carcinogenic exposures.  There is no family history of any urologic malignancies.  Urinalysis today 3-10 RBCs, 0-5 WBCs, few bacteria, no yeast, nitrite negative, trace leukocytes  PMH: Past Medical History:  Diagnosis Date  . Frequent UTI   . Hypertension    Family History: Family History  Problem Relation Age of Onset  . Arthritis Mother   . Cancer Father        prostate  . Heart attack Father   . Healthy Sister   . Healthy Brother   . Hypertension Daughter   . Healthy Brother   . Healthy Daughter   . Healthy Daughter     Social History:  reports that she has never smoked. She has never used smokeless tobacco. She reports that she does not drink alcohol or use drugs.  Physical Exam: BP 126/80   Pulse 66   Wt 143 lb (64.9 kg)   BMI 27.70 kg/m    Constitutional:  Alert and oriented, No acute distress. Cardiovascular: No clubbing, cyanosis, or edema. Respiratory: Normal respiratory effort, no increased work of  breathing. GI: Abdomen is soft, nontender, nondistended, no abdominal masses GU: No CVA tenderness  Laboratory Data: Reviewed, see HPI  Pertinent Imaging: I have personally reviewed the CT and renal/pelvic ultrasound, see HPI for details  Assessment & Plan:   In summary, she is a 49 year old female with persistent microscopic hematuria as well as intermittent right-sided flank pain.  I reassured her that there is nothing on her CT or ultrasound that would suggest a renal etiology of her right-sided flank pain as there is no hydronephrosis, nephrolithiasis, or renal masses.  We discussed common possible etiologies of microscopic hematuria including idiopathic, urolithiasis, medical renal disease, and malignancy. We discussed the new asymptomatic microscopic hematuria guidelines and risk categories of low, intermediate, and high risk that are based on age, risk factors like smoking, and degree of microscopic hematuria. We discussed work-up can range from repeat urinalysis, renal ultrasound and cystoscopy, to CT urogram and cystoscopy.  She falls into the intermediate risk category, and I recommended proceeding with cystoscopy.  She has already completed imaging with a renal ultrasound that was benign, as well as a non-contrast CT, and no further imaging is required per the AUA guidelines.  Legrand Rams, MD 04/21/2019  [facility] [address] [place], [place] [address] [phone]

## 2019-04-22 LAB — URINALYSIS, COMPLETE
Bilirubin, UA: NEGATIVE
Glucose, UA: NEGATIVE
Ketones, UA: NEGATIVE
Nitrite, UA: NEGATIVE
Protein,UA: NEGATIVE
Specific Gravity, UA: 1.02 (ref 1.005–1.030)
Urobilinogen, Ur: 0.2 mg/dL (ref 0.2–1.0)
pH, UA: 7 (ref 5.0–7.5)

## 2019-04-22 LAB — MICROSCOPIC EXAMINATION

## 2019-05-13 ENCOUNTER — Other Ambulatory Visit: Payer: Self-pay | Admitting: Urology

## 2019-05-31 ENCOUNTER — Other Ambulatory Visit: Payer: Self-pay | Admitting: Adult Health

## 2019-07-15 ENCOUNTER — Telehealth: Payer: Self-pay | Admitting: Physician Assistant

## 2019-07-15 DIAGNOSIS — M79606 Pain in leg, unspecified: Secondary | ICD-10-CM | POA: Diagnosis not present

## 2019-07-15 DIAGNOSIS — I10 Essential (primary) hypertension: Secondary | ICD-10-CM | POA: Diagnosis not present

## 2019-07-15 DIAGNOSIS — M25562 Pain in left knee: Secondary | ICD-10-CM | POA: Diagnosis not present

## 2019-07-15 DIAGNOSIS — Z6828 Body mass index (BMI) 28.0-28.9, adult: Secondary | ICD-10-CM | POA: Diagnosis not present

## 2019-07-15 DIAGNOSIS — F329 Major depressive disorder, single episode, unspecified: Secondary | ICD-10-CM | POA: Diagnosis not present

## 2019-07-15 DIAGNOSIS — G4762 Sleep related leg cramps: Secondary | ICD-10-CM | POA: Diagnosis not present

## 2019-07-15 DIAGNOSIS — M79662 Pain in left lower leg: Secondary | ICD-10-CM | POA: Diagnosis not present

## 2019-07-15 DIAGNOSIS — R200 Anesthesia of skin: Secondary | ICD-10-CM | POA: Diagnosis not present

## 2019-07-15 DIAGNOSIS — M62838 Other muscle spasm: Secondary | ICD-10-CM | POA: Diagnosis not present

## 2019-07-15 DIAGNOSIS — M62831 Muscle spasm of calf: Secondary | ICD-10-CM | POA: Diagnosis not present

## 2019-07-15 DIAGNOSIS — Z79899 Other long term (current) drug therapy: Secondary | ICD-10-CM | POA: Diagnosis not present

## 2019-07-15 DIAGNOSIS — F419 Anxiety disorder, unspecified: Secondary | ICD-10-CM | POA: Diagnosis not present

## 2019-07-15 NOTE — Telephone Encounter (Signed)
Patient called stating she is having bad pain in her leg that shots from behind knee to inner upper thigh. Patient states pain is sharp and then she had tingling sensations and "pulsing" sensation.   Patient has no hx of DVT.   Advised patient she should go to UC for evaluation to rule out DVT. Patient verbalized understanding.    Kandis Cocking is aware and is agreeable. AS, CMA

## 2019-08-02 ENCOUNTER — Other Ambulatory Visit: Payer: Self-pay | Admitting: Adult Health

## 2019-08-03 ENCOUNTER — Telehealth: Payer: Self-pay | Admitting: Physician Assistant

## 2019-08-03 MED ORDER — LISINOPRIL-HYDROCHLOROTHIAZIDE 20-12.5 MG PO TABS: 1.0000 | ORAL_TABLET | Freq: Every day | ORAL | 0 refills | Status: DC

## 2019-08-03 NOTE — Telephone Encounter (Signed)
Patient is requesting a refill of her BP meds, if approved please send to [facility] in [place]

## 2019-08-03 NOTE — Telephone Encounter (Signed)
Please call pt to schedule appt.  No further refills until pt is seen.  T. Kavontae Pritchard, CMA  

## 2019-08-03 NOTE — Addendum Note (Signed)
Addended by: Stan Head on: 08/03/2019 02:50 PM   Modules accepted: Orders

## 2019-08-12 DIAGNOSIS — I10 Essential (primary) hypertension: Secondary | ICD-10-CM | POA: Diagnosis not present

## 2019-08-12 DIAGNOSIS — G2581 Restless legs syndrome: Secondary | ICD-10-CM | POA: Diagnosis not present

## 2019-08-17 ENCOUNTER — Other Ambulatory Visit: Payer: BC Managed Care – PPO

## 2019-08-19 ENCOUNTER — Other Ambulatory Visit: Payer: Self-pay

## 2019-08-19 ENCOUNTER — Ambulatory Visit
Admission: RE | Admit: 2019-08-19 | Discharge: 2019-08-19 | Disposition: A | Discharge status: Home / Self Care | Payer: BC Managed Care – PPO | Source: Ambulatory Visit | Attending: Obstetrics and Gynecology | Admitting: Obstetrics and Gynecology

## 2019-08-19 DIAGNOSIS — N6320 Unspecified lump in the left breast, unspecified quadrant: Secondary | ICD-10-CM

## 2019-08-19 DIAGNOSIS — N6321 Unspecified lump in the left breast, upper outer quadrant: Secondary | ICD-10-CM | POA: Diagnosis not present

## 2019-08-19 DIAGNOSIS — R928 Other abnormal and inconclusive findings on diagnostic imaging of breast: Secondary | ICD-10-CM | POA: Diagnosis not present

## 2019-08-19 IMAGING — US US BREAST*L* LIMITED INC AXILLA
1 series · 4 of 4 positions shown · non-contrast
Comparison: Previous exam(s).

CLINICAL DATA: Patient presents for 1 year follow-up of probably
benign left breast mass.

EXAM:
DIGITAL DIAGNOSTIC BILATERAL MAMMOGRAM WITH CAD AND TOMO
ULTRASOUND LEFT BREAST

[Series 1: us breast*left* limited inc axilla · 0.06mm/px · 4 of 4 slices shown]
[im 1/4]
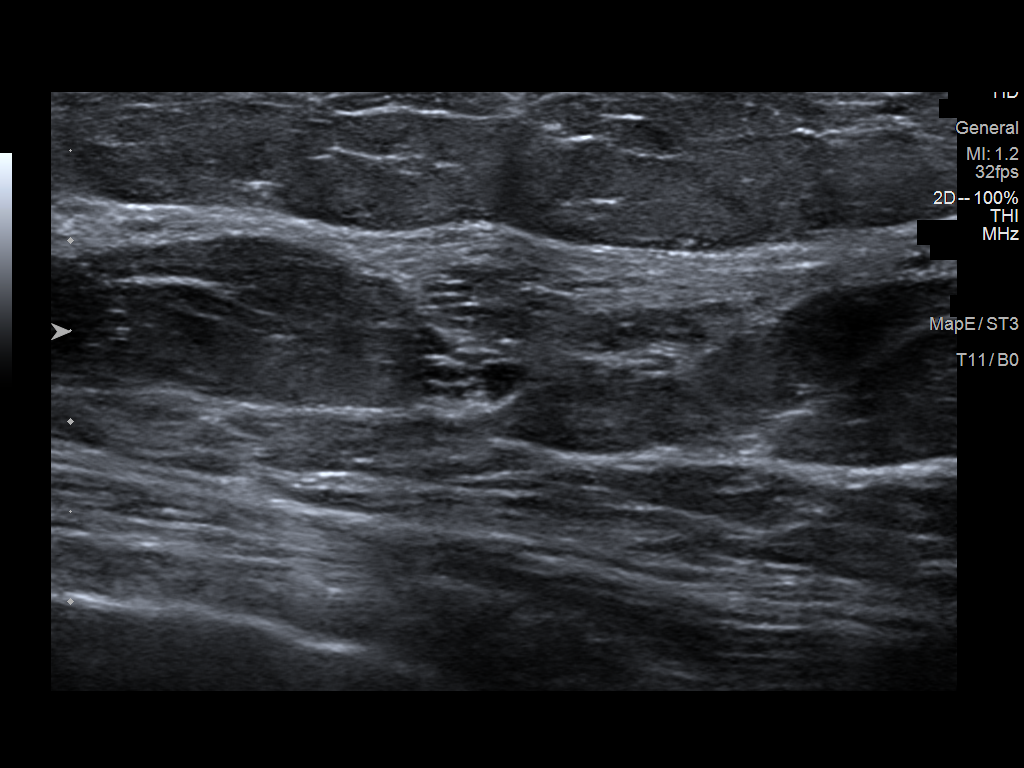
[im 2/4]
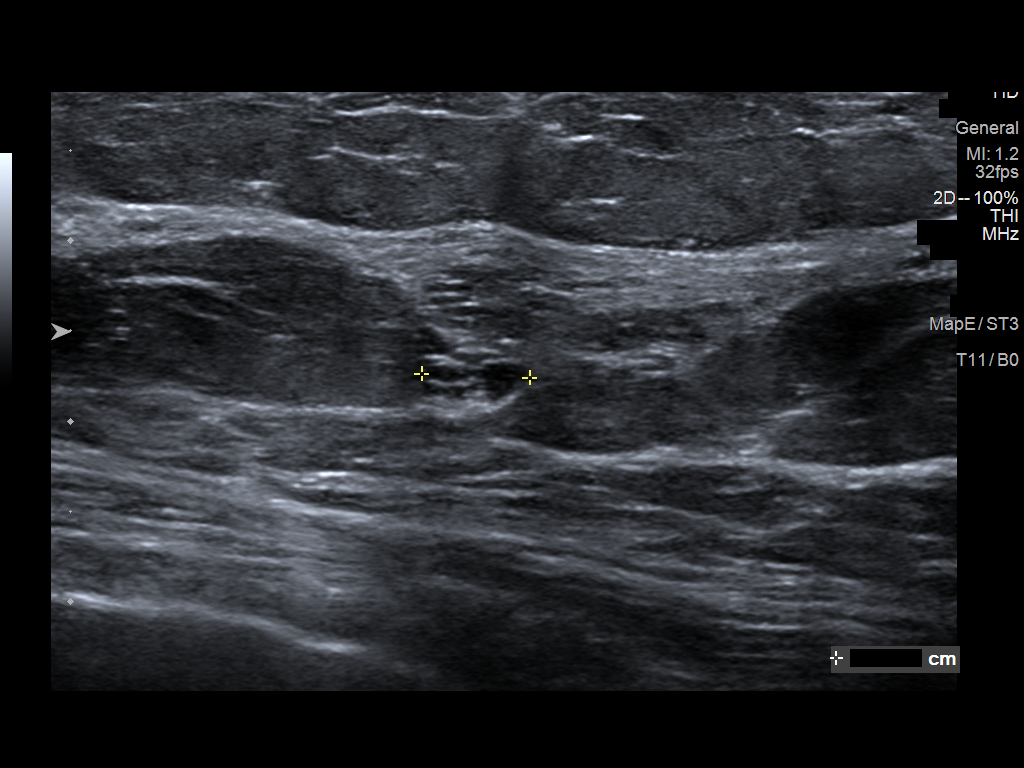
[im 3/4]
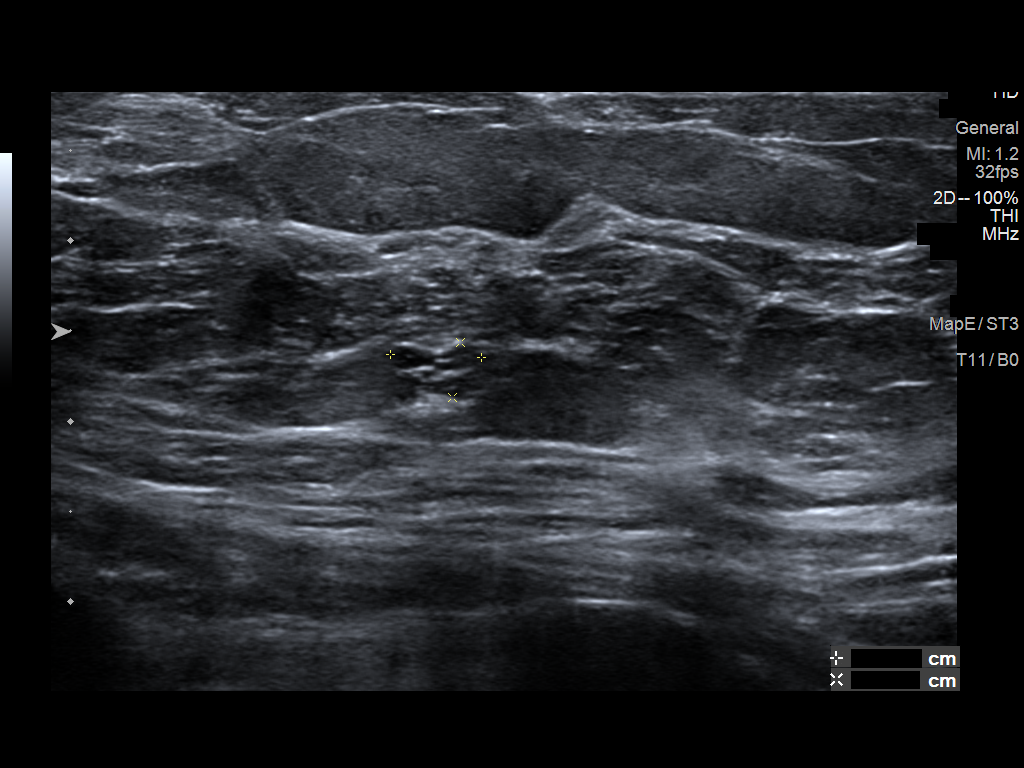
[im 4/4]
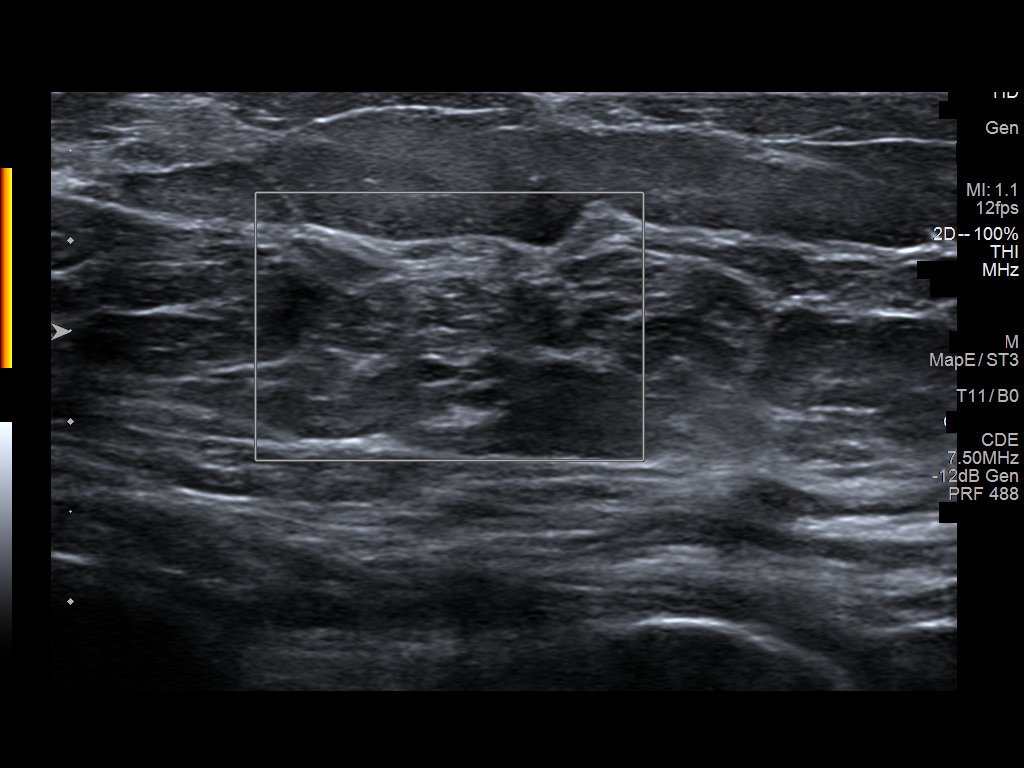

[4 of 4 positions shown; findings below may reference images not displayed]

ACR Breast Density Category b: There are scattered areas of
fibroglandular density.
FINDINGS: Grossly unchanged lobular mass within the outer left breast. No new
masses, calcifications or nonsurgical distortion identified within
the right or left breast.

Mammographic images were processed with CAD.

Targeted ultrasound is performed, showing a stable probable cluster
of cysts left breast 2 o'clock position 4 cm from nipple measuring 5
x 3 x 6 mm, previously 5 x 3 x 7 mm.
IMPRESSION: Stable probably benign left breast mass favored to represent a
cluster of cysts.

RECOMMENDATION:
Bilateral diagnostic mammography with left breast ultrasound in 12
months.

I have discussed the findings and recommendations with the patient.
If applicable, a reminder letter will be sent to the patient
regarding the next appointment.

BI-RADS CATEGORY  3: Probably benign.

## 2019-08-19 IMAGING — MG DIGITAL DIAGNOSTIC BILAT W/ TOMO W/ CAD
8 series · 8 of 24 positions shown · non-contrast
Comparison: Previous exam(s).

CLINICAL DATA: Patient presents for 1 year follow-up of probably
benign left breast mass.

EXAM:
DIGITAL DIAGNOSTIC BILATERAL MAMMOGRAM WITH CAD AND TOMO
ULTRASOUND LEFT BREAST

[L MLO synth-2D]
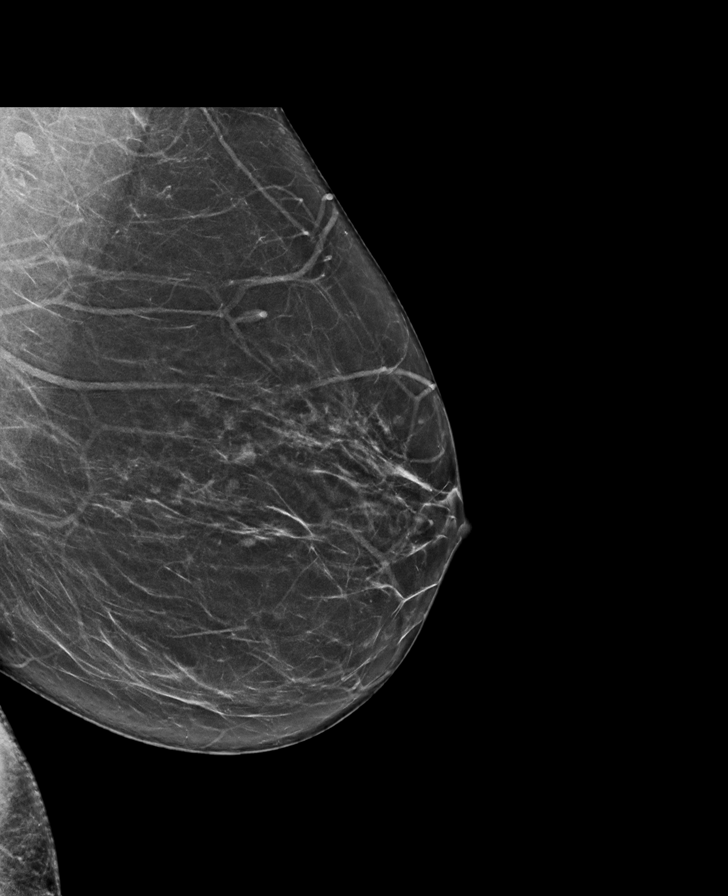

[R CC synth-2D]
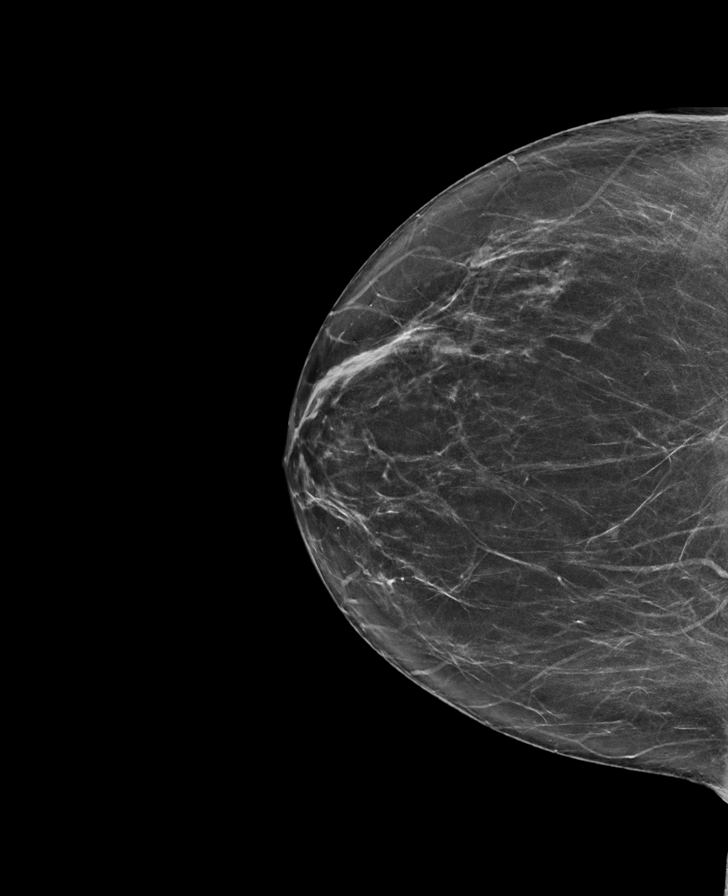

[R MLO synth-2D]
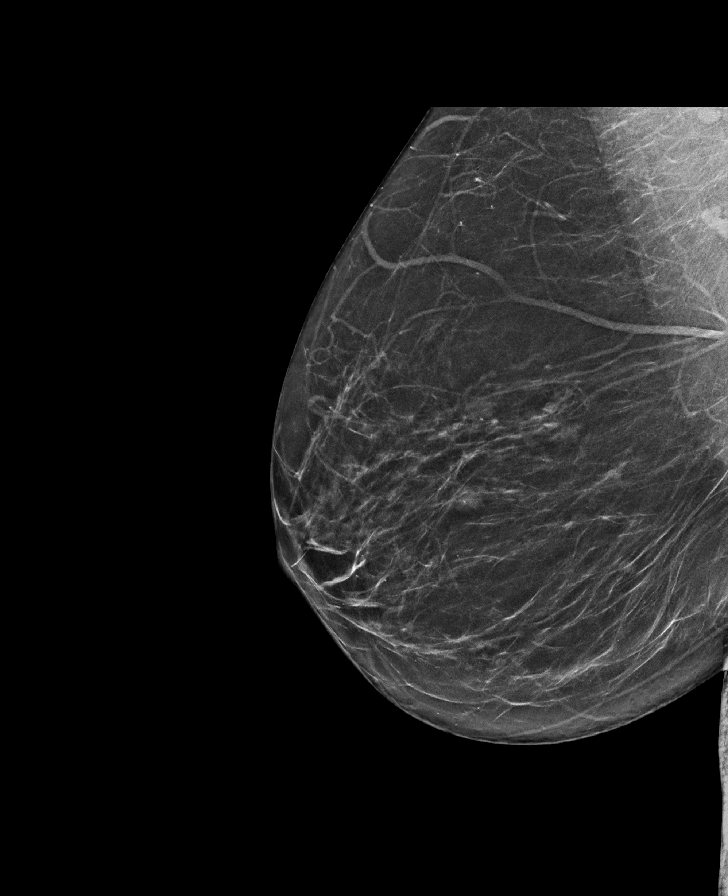

[L CC synth-2D]
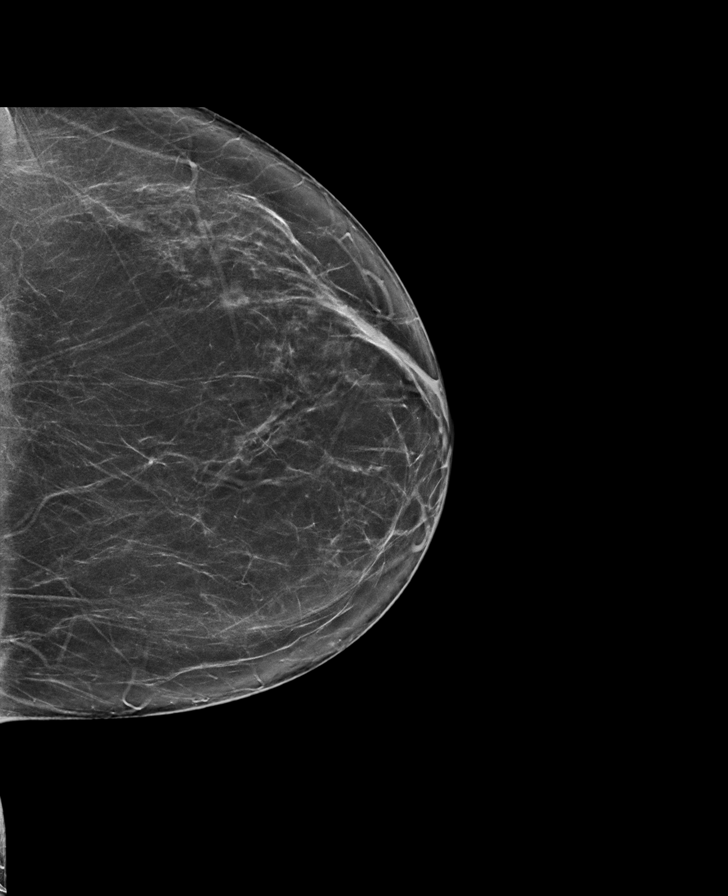

[L CC tomo · tomo slice 37/74.0]
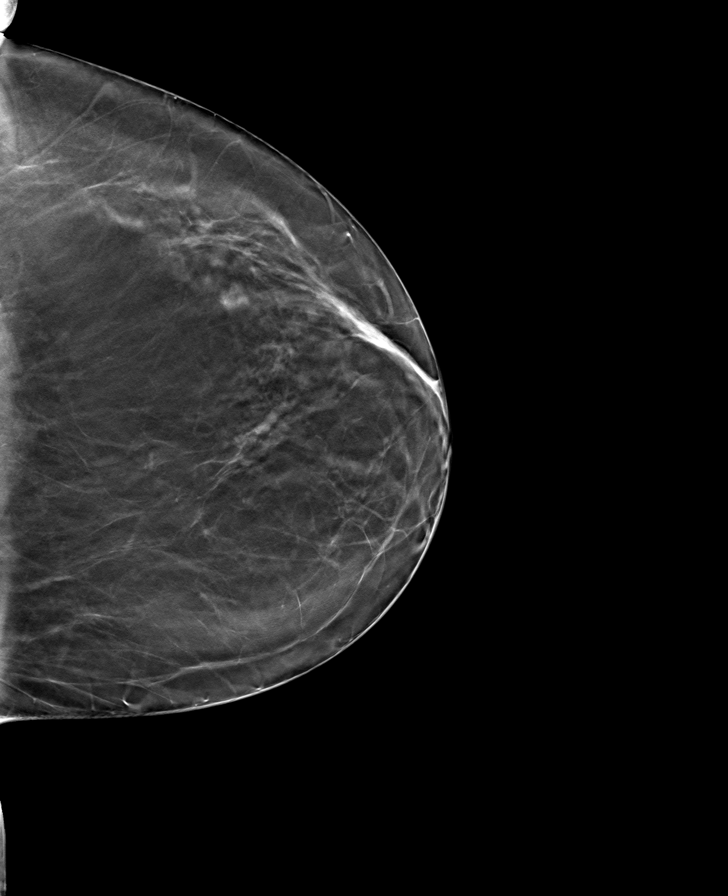

[R CC tomo · tomo slice 35/70.0]
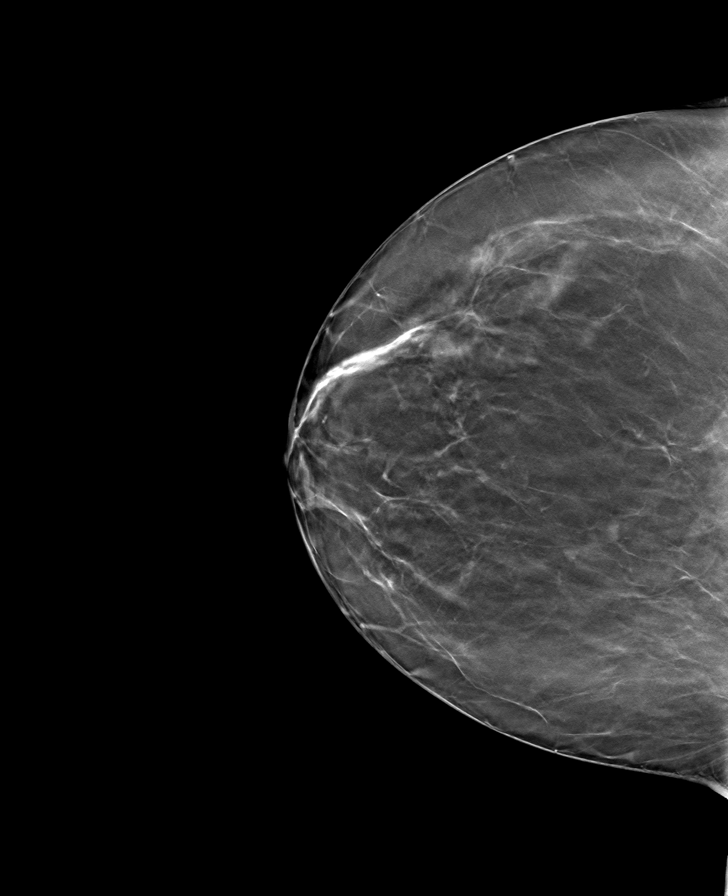

[R MLO tomo · tomo slice 35/68.0]
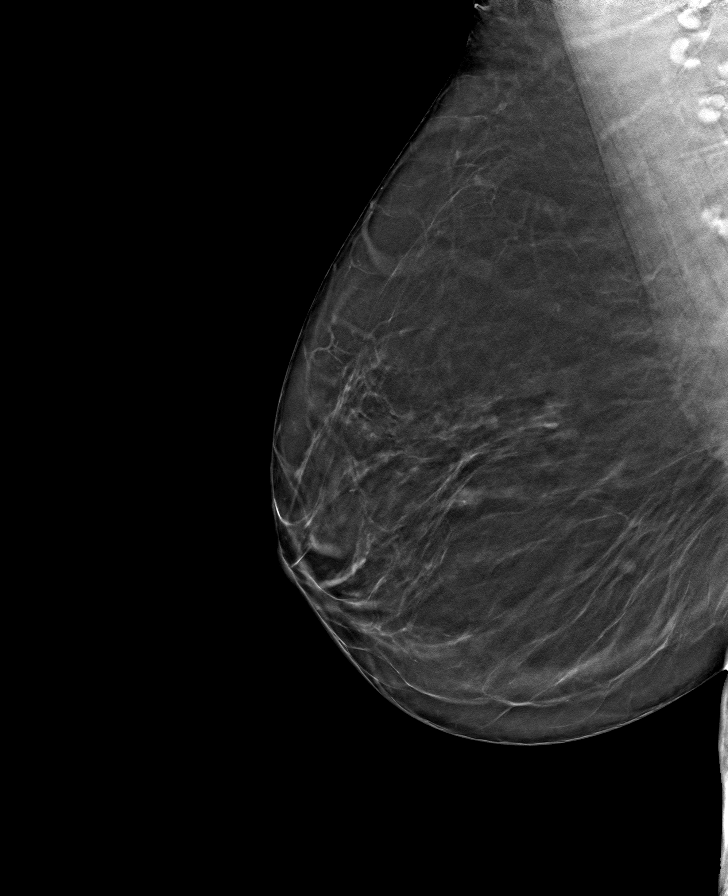

[L MLO tomo · tomo slice 36/71.0]
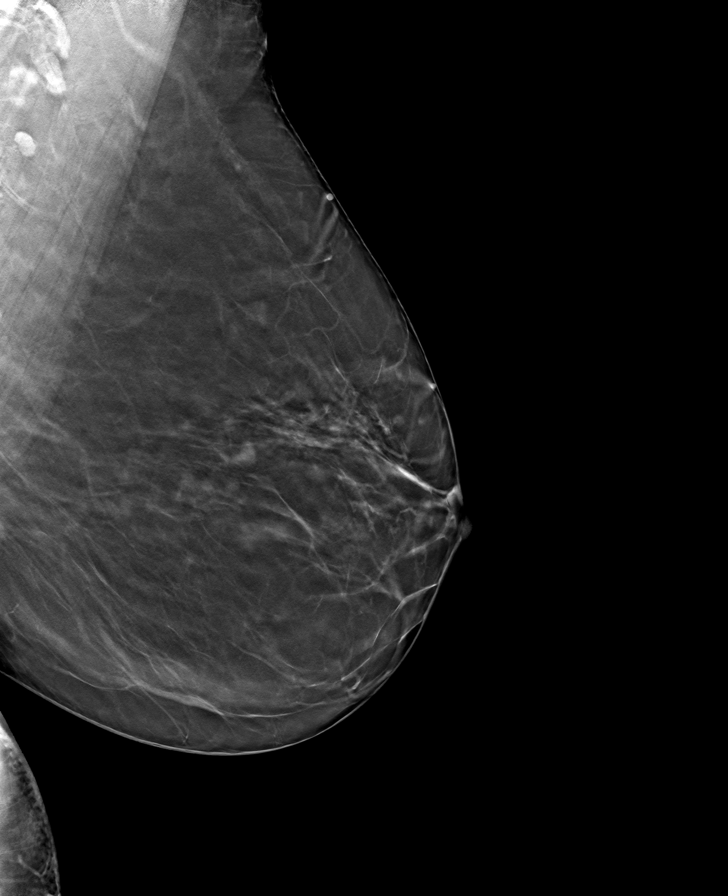

[8 of 24 positions shown; findings below may reference images not displayed]

ACR Breast Density Category b: There are scattered areas of
fibroglandular density.
FINDINGS: Grossly unchanged lobular mass within the outer left breast. No new
masses, calcifications or nonsurgical distortion identified within
the right or left breast.

Mammographic images were processed with CAD.

Targeted ultrasound is performed, showing a stable probable cluster
of cysts left breast 2 o'clock position 4 cm from nipple measuring 5
x 3 x 6 mm, previously 5 x 3 x 7 mm.
IMPRESSION: Stable probably benign left breast mass favored to represent a
cluster of cysts.

RECOMMENDATION:
Bilateral diagnostic mammography with left breast ultrasound in 12
months.

I have discussed the findings and recommendations with the patient.
If applicable, a reminder letter will be sent to the patient
regarding the next appointment.

BI-RADS CATEGORY  3: Probably benign.

## 2019-08-26 ENCOUNTER — Ambulatory Visit: Payer: BC Managed Care – PPO | Admitting: Physician Assistant

## 2019-09-13 DIAGNOSIS — M25559 Pain in unspecified hip: Secondary | ICD-10-CM | POA: Diagnosis not present

## 2019-09-23 DIAGNOSIS — Z6828 Body mass index (BMI) 28.0-28.9, adult: Secondary | ICD-10-CM | POA: Diagnosis not present

## 2019-09-23 DIAGNOSIS — Z01419 Encounter for gynecological examination (general) (routine) without abnormal findings: Secondary | ICD-10-CM | POA: Diagnosis not present

## 2019-10-15 DIAGNOSIS — M25551 Pain in right hip: Secondary | ICD-10-CM | POA: Diagnosis not present

## 2019-10-15 DIAGNOSIS — M79662 Pain in left lower leg: Secondary | ICD-10-CM | POA: Diagnosis not present

## 2019-11-09 DIAGNOSIS — M25551 Pain in right hip: Secondary | ICD-10-CM | POA: Diagnosis not present

## 2019-11-10 ENCOUNTER — Other Ambulatory Visit: Payer: Self-pay

## 2019-11-10 DIAGNOSIS — M25552 Pain in left hip: Secondary | ICD-10-CM

## 2019-11-10 DIAGNOSIS — R102 Pelvic and perineal pain: Secondary | ICD-10-CM

## 2019-11-22 ENCOUNTER — Other Ambulatory Visit: Payer: Self-pay | Admitting: Family Medicine

## 2019-11-22 ENCOUNTER — Other Ambulatory Visit: Payer: Self-pay

## 2019-11-22 ENCOUNTER — Ambulatory Visit
Admission: RE | Admit: 2019-11-22 | Discharge: 2019-11-22 | Disposition: A | Discharge status: Home / Self Care | Payer: BC Managed Care – PPO | Attending: Family Medicine | Admitting: Family Medicine

## 2019-11-22 ENCOUNTER — Ambulatory Visit
Admission: RE | Admit: 2019-11-22 | Discharge: 2019-11-22 | Disposition: A | Discharge status: Home / Self Care | Payer: BC Managed Care – PPO | Source: Ambulatory Visit | Attending: Family Medicine | Admitting: Family Medicine

## 2019-11-22 DIAGNOSIS — M25512 Pain in left shoulder: Secondary | ICD-10-CM

## 2019-11-22 IMAGING — CR DG SHOULDER 2+V*L*
1 series · 4 of 4 positions shown · non-contrast
Comparison: None.

CLINICAL DATA: Shoulder pain. No recent injury.

EXAM:
LEFT SHOULDER - 2+ VIEW

[Series 1: dg shoulder left · 0.14mm/px · 4 of 4 slices shown]
[im 1/4]
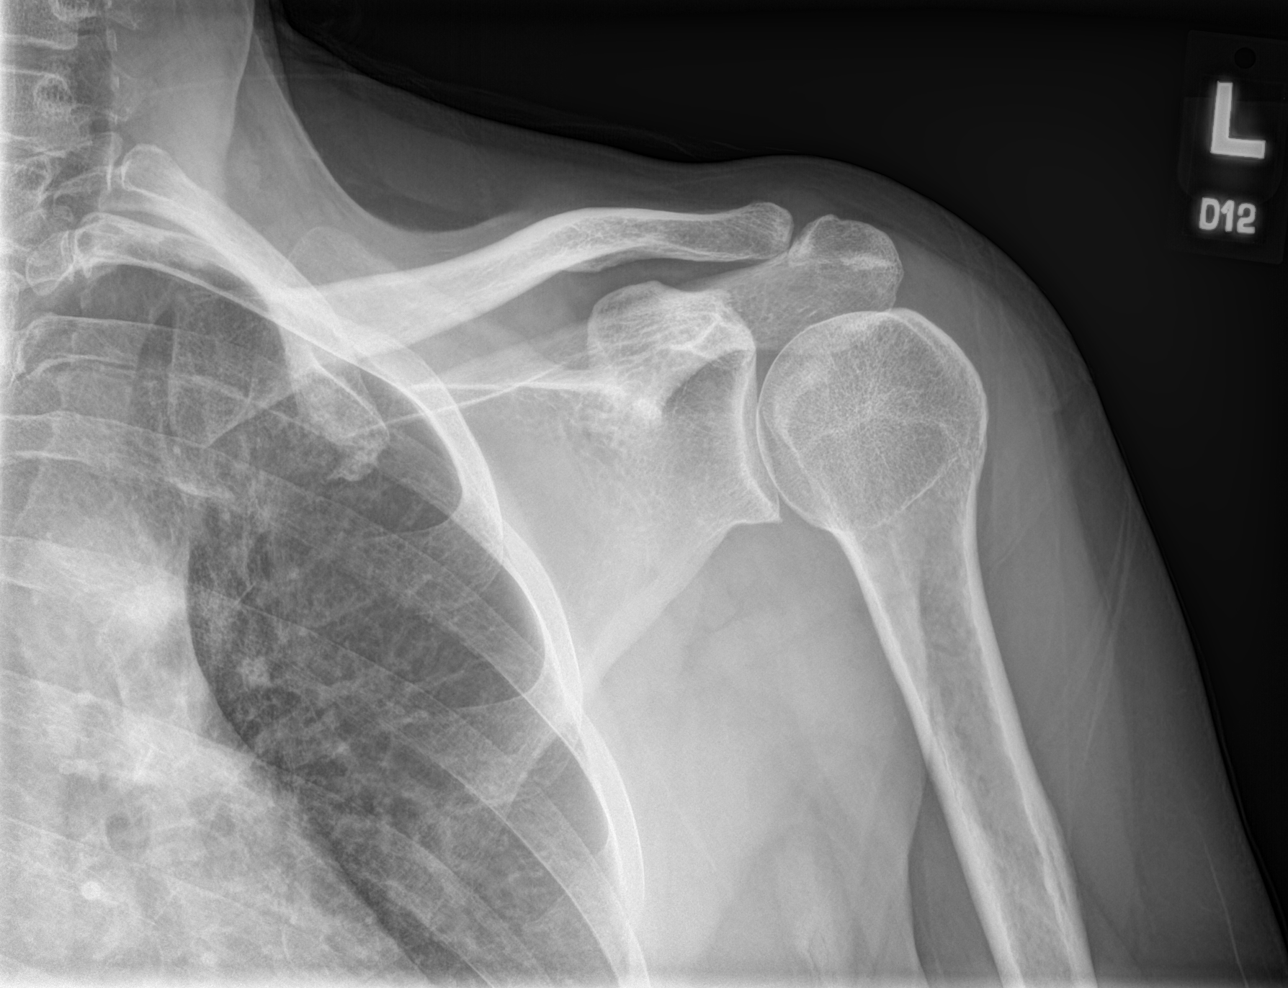
[im 2/4]
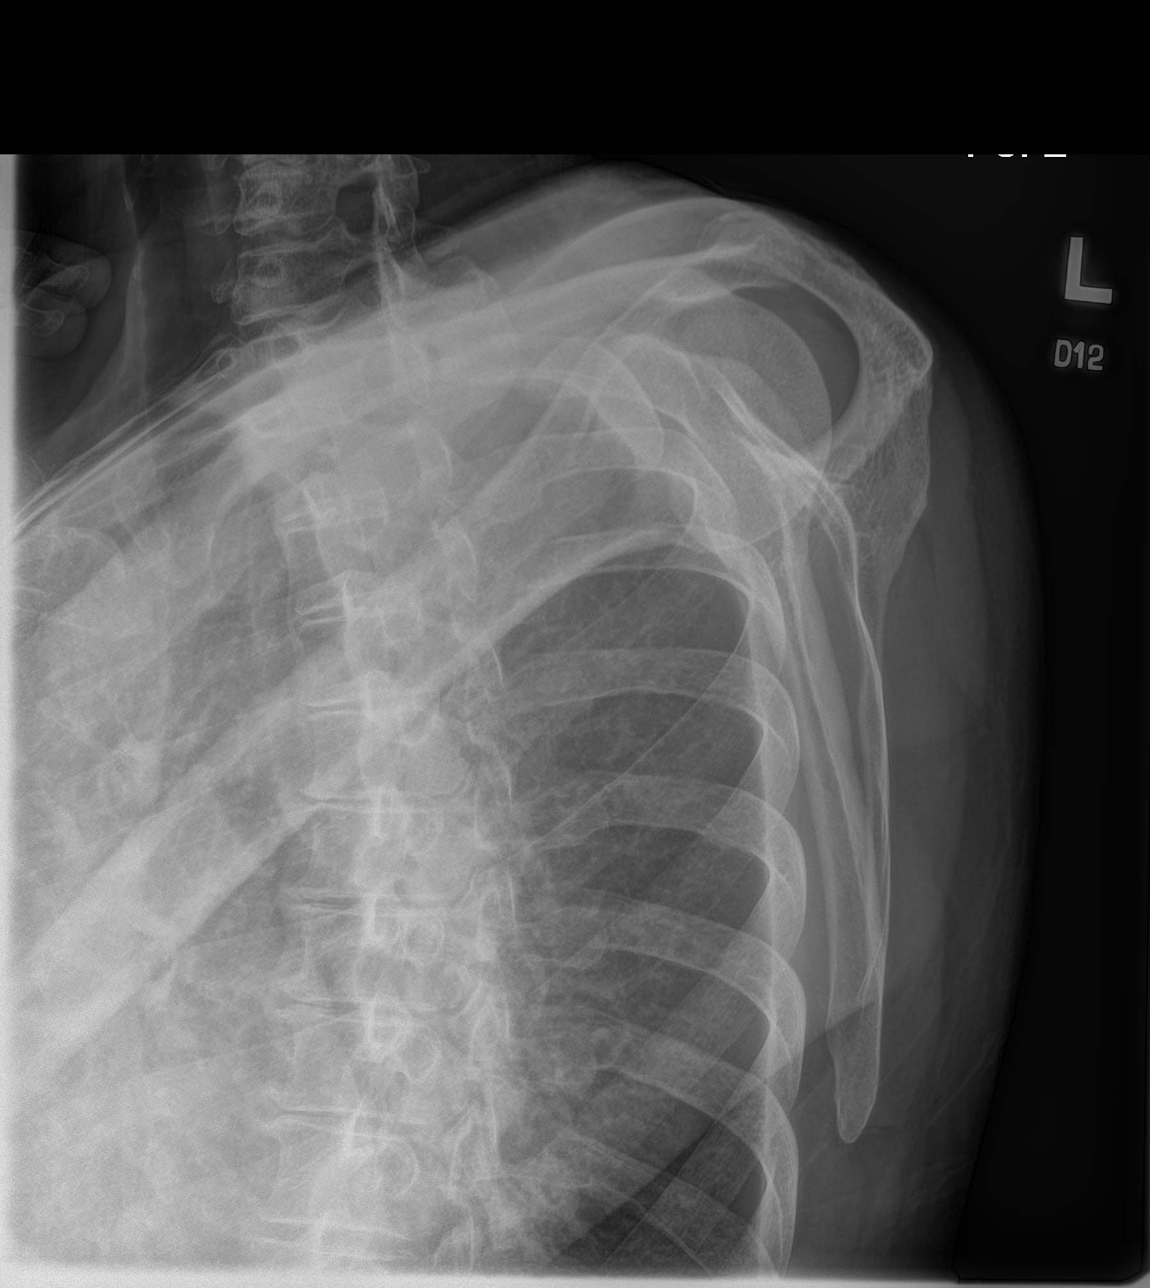
[im 3/4]
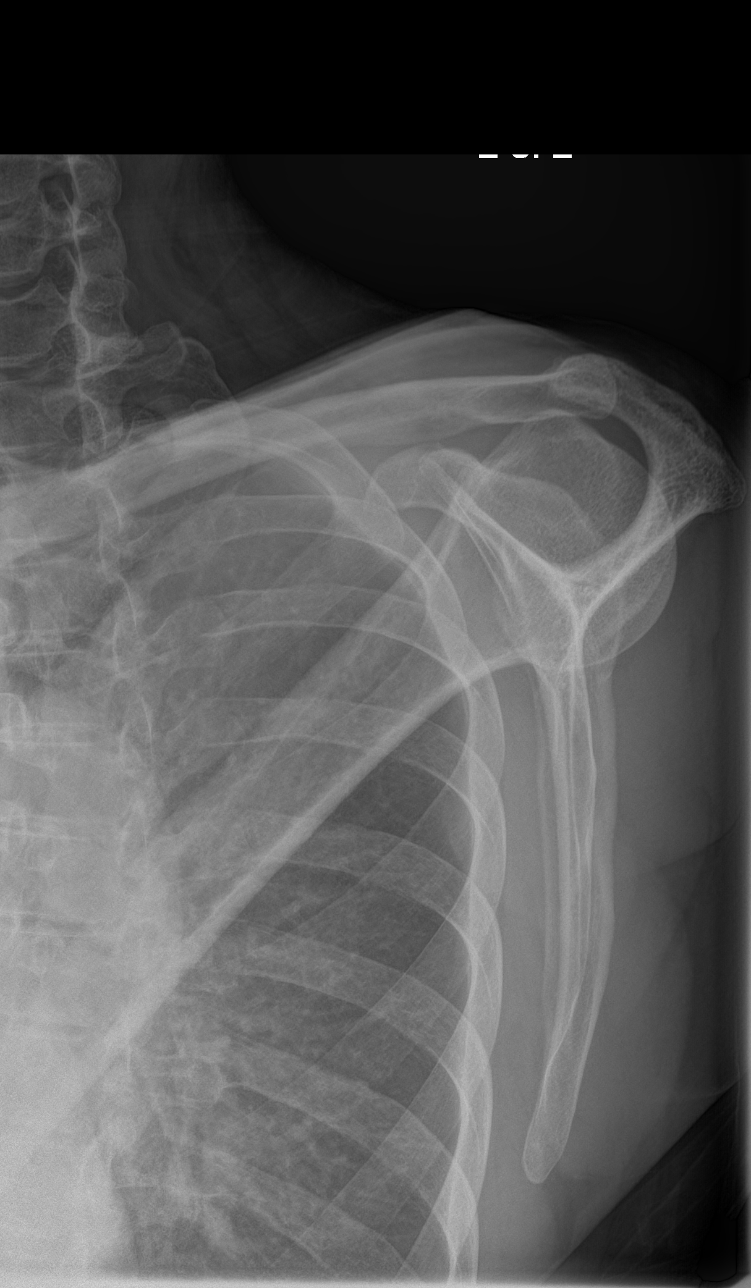
[im 4/4]
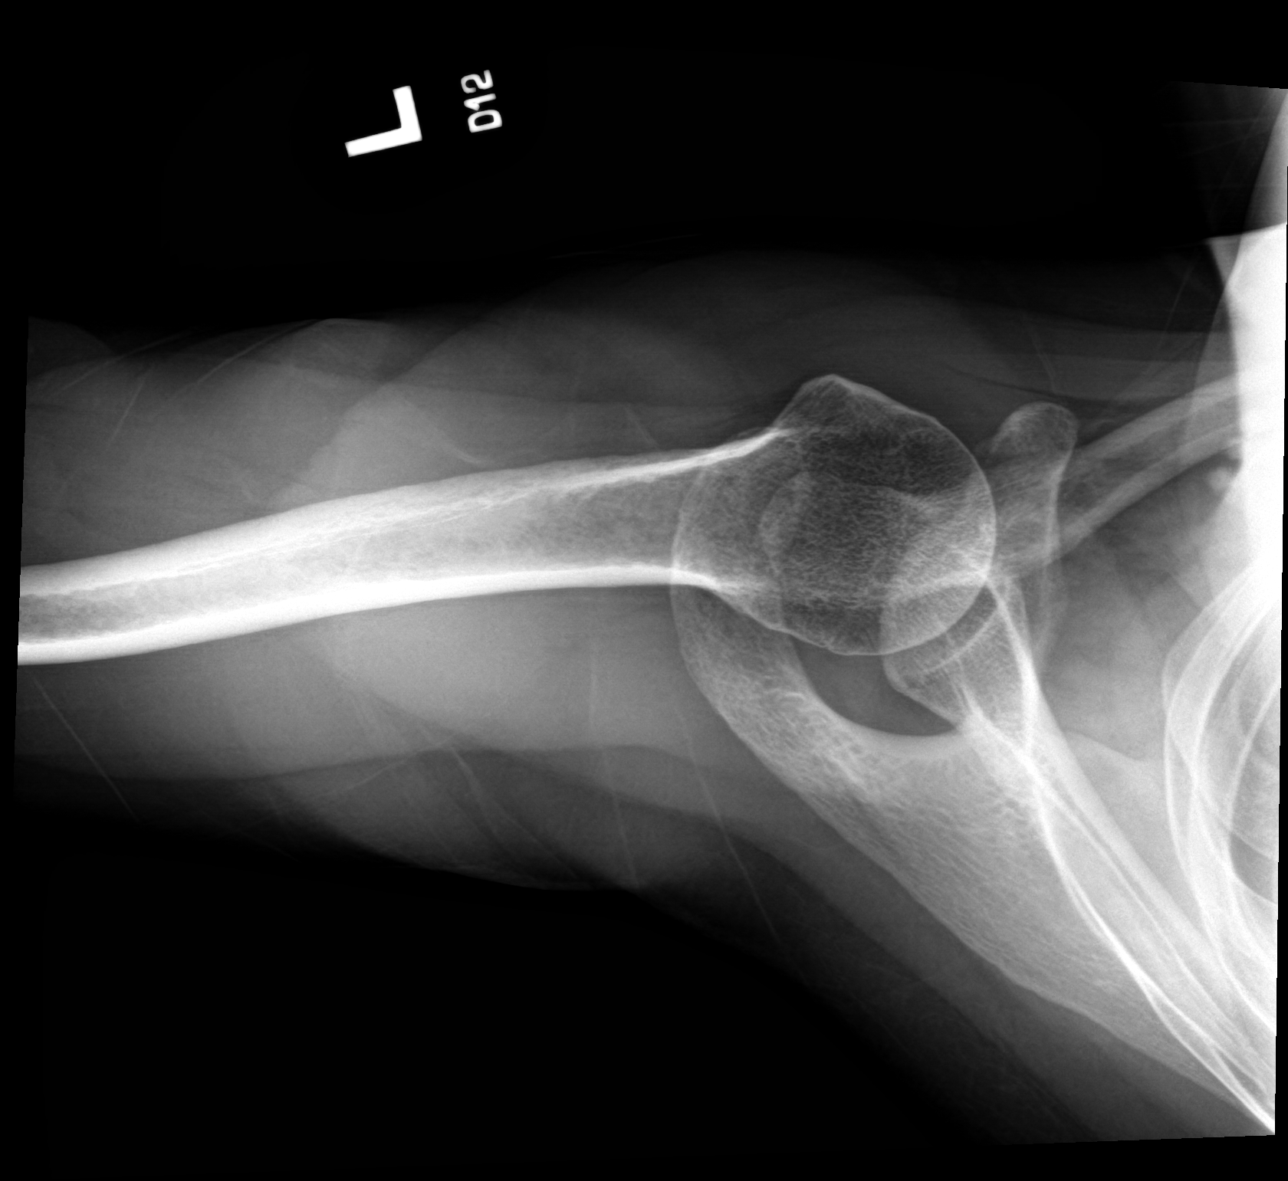

[4 of 4 positions shown; findings below may reference images not displayed]

FINDINGS: Humeral head is properly located. No glenohumeral joint degenerative
change. Normal humeral acromial distance. AC joint appears normal.
Other regional structures appear normal.
IMPRESSION: Negative.

## 2019-12-13 ENCOUNTER — Ambulatory Visit
Admission: RE | Admit: 2019-12-13 | Discharge: 2019-12-13 | Disposition: A | Discharge status: Home / Self Care | Payer: BC Managed Care – PPO | Source: Ambulatory Visit | Attending: Orthopedic Surgery | Admitting: Orthopedic Surgery

## 2019-12-13 ENCOUNTER — Other Ambulatory Visit: Payer: Self-pay

## 2019-12-13 DIAGNOSIS — R102 Pelvic and perineal pain: Secondary | ICD-10-CM

## 2019-12-13 DIAGNOSIS — M25551 Pain in right hip: Secondary | ICD-10-CM

## 2019-12-13 DIAGNOSIS — M25552 Pain in left hip: Secondary | ICD-10-CM | POA: Diagnosis not present

## 2019-12-13 DIAGNOSIS — M542 Cervicalgia: Secondary | ICD-10-CM | POA: Diagnosis not present

## 2019-12-13 DIAGNOSIS — M25512 Pain in left shoulder: Secondary | ICD-10-CM | POA: Diagnosis not present

## 2019-12-13 IMAGING — MR MR PELVIS W/O CM
5 series · 38 of 48 positions shown · non-contrast
Comparison: None.

CLINICAL DATA: Bilateral hip pain.  No known injury.

EXAM:
MRI PELVIS WITHOUT CONTRAST
TECHNIQUE: Multiplanar multisequence MR imaging of the pelvis was performed. No
intravenous contrast was administered.

[Series 3: T1 · axial · 4.0mm · 0.78mm/px · z∈[-95,+110]mm · 8 of 42 slices shown (1 of 2)]
[im 1/42]
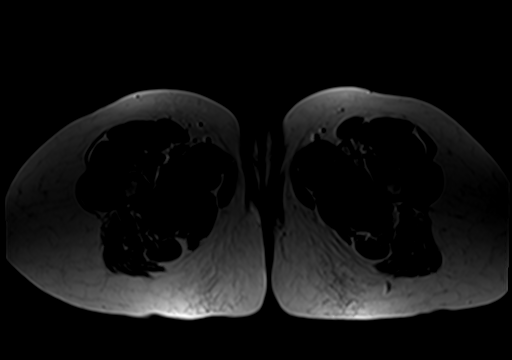
[im 5/42]
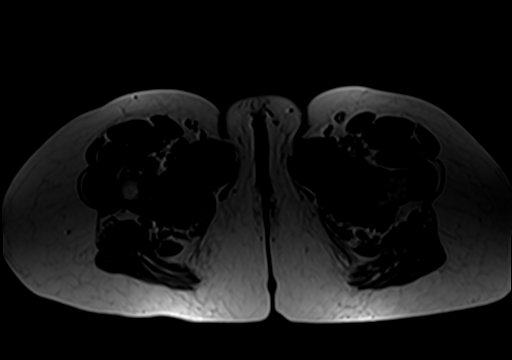
[im 14/42]
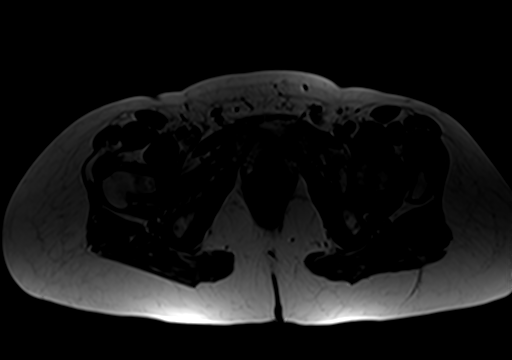
[im 19/42]
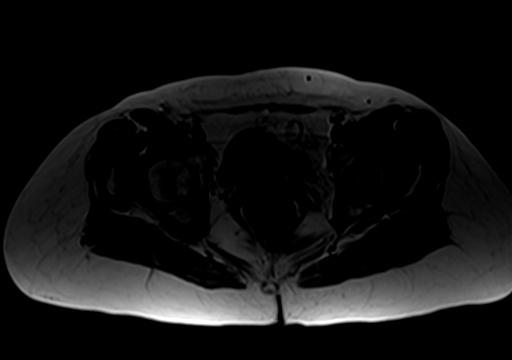
[im 23/42]
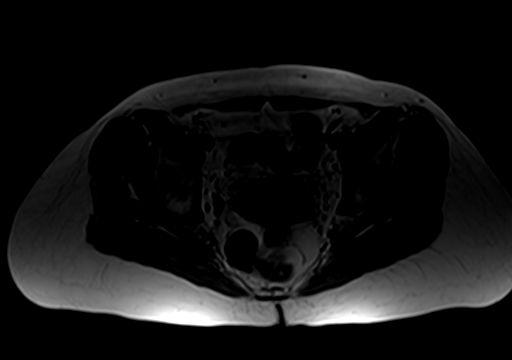
[im 28/42]
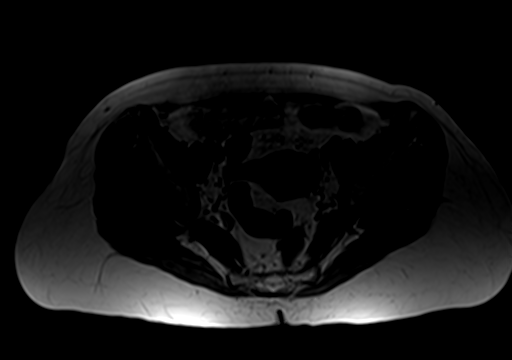
[im 37/42]
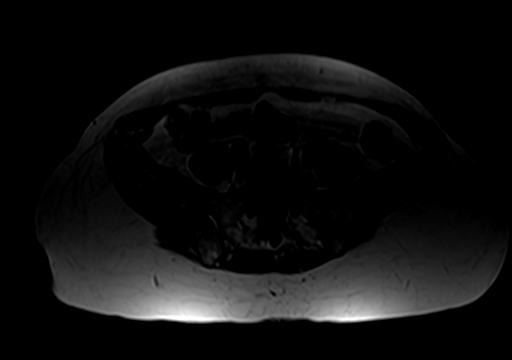
[im 42/42]
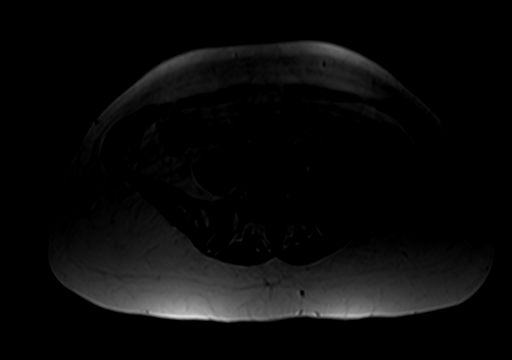

[Series 4: T2 fat-sat · axial · 4.0mm · 1.56mm/px · z∈[-95,+110]mm · 9 of 42 slices shown (1 of 2)]
[im 1/42]
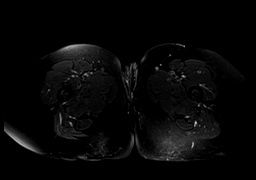
[im 6/42]
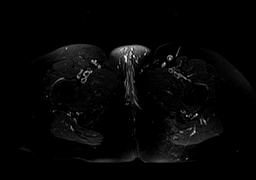
[im 11/42]
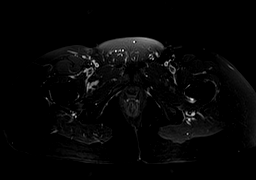
[im 16/42]
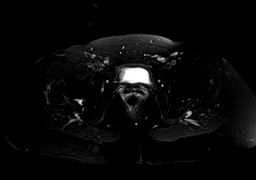
[im 21/42]
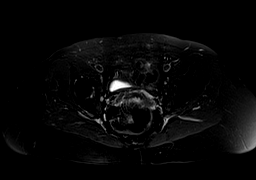
[im 26/42]
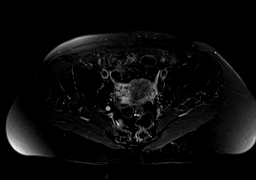
[im 31/42]
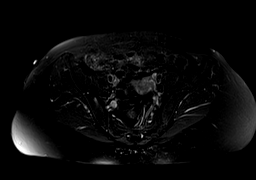
[im 36/42]
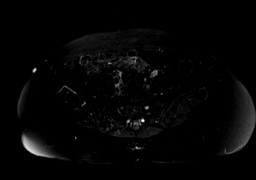
[im 42/42]
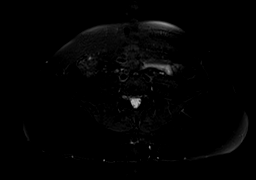

[Series 5: T2 fat-sat · sagittal · 5.0mm · 0.47mm/px · 9 of 60 slices shown (2 of 2)]
[im 1/60]
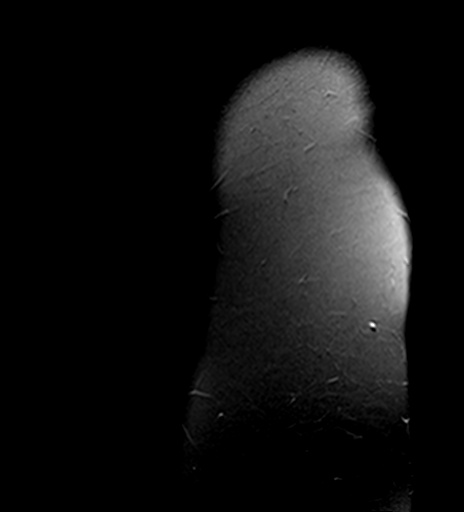
[im 10/60]
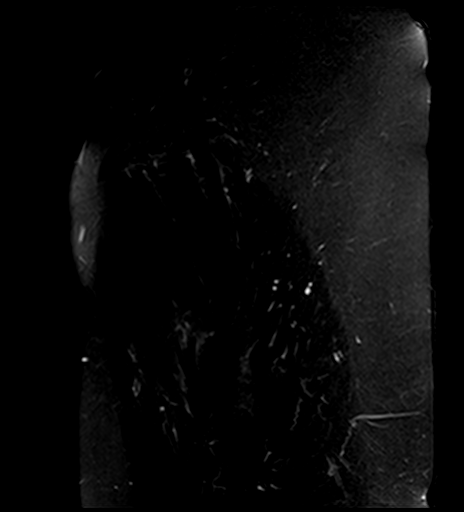
[im 20/60]
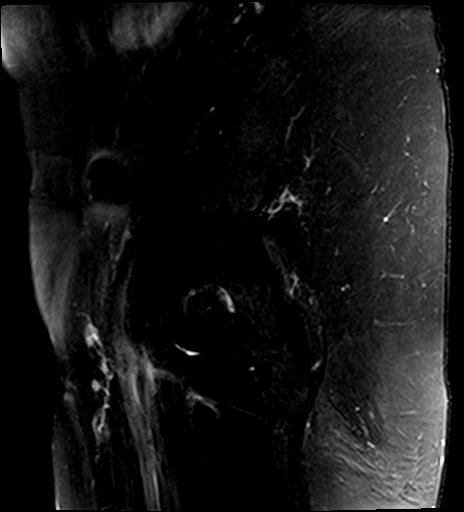
[im 25/60]
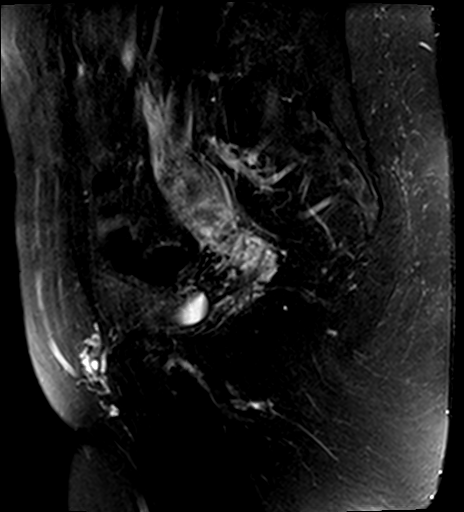
[im 30/60]
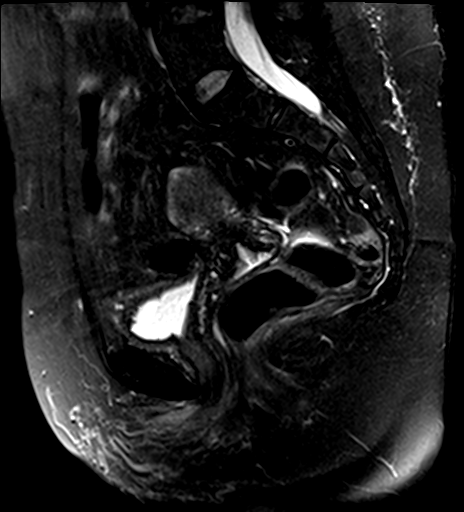
[im 35/60]
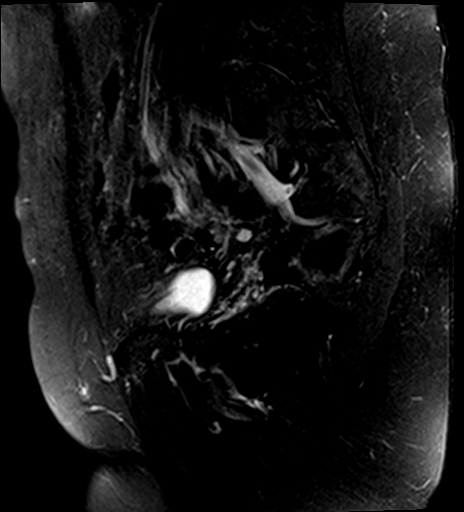
[im 40/60]
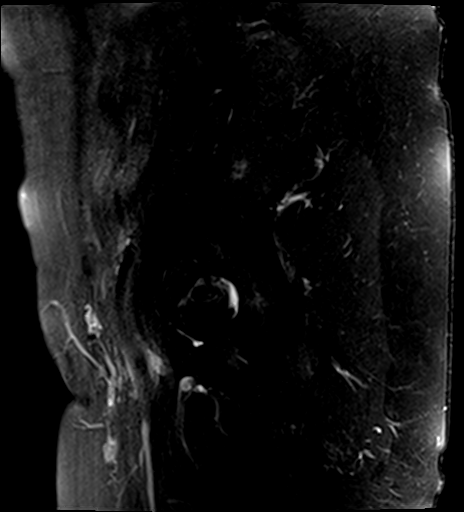
[im 50/60]
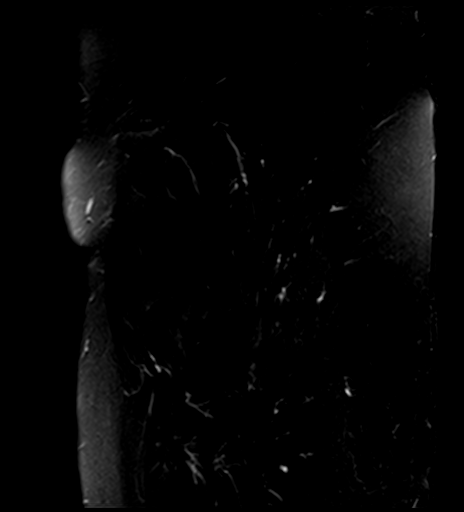
[im 60/60]
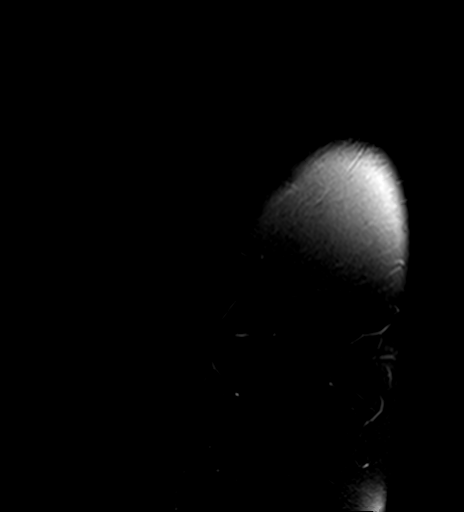

[Series 6: T1 · coronal · 4.0mm · 1.56mm/px · 8 of 36 slices shown (2 of 2)]
[im 1/36]
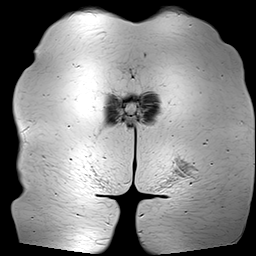
[im 6/36]
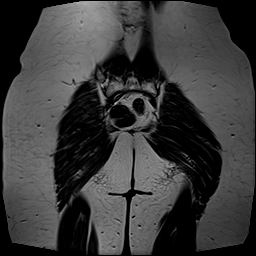
[im 11/36]
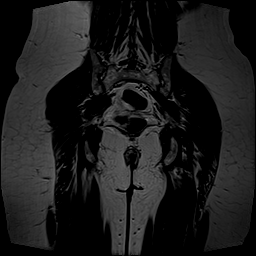
[im 16/36]
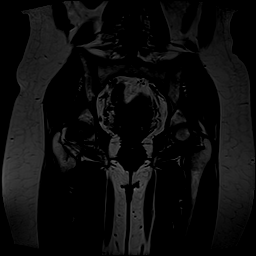
[im 21/36]
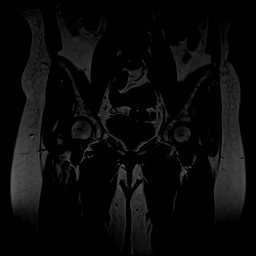
[im 26/36]
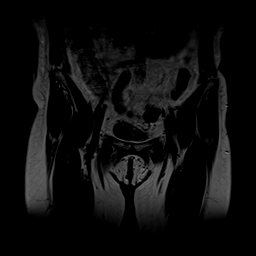
[im 31/36]
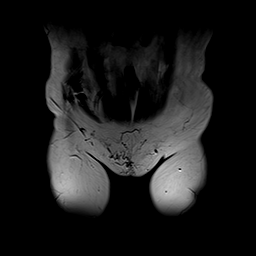
[im 36/36]
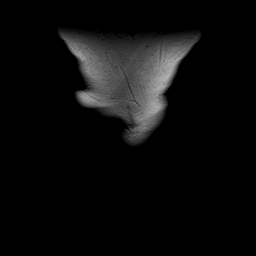

[Series 7: STIR · coronal · 4.0mm · 1.56mm/px · 4 of 36 slices shown]
[im 1/36]
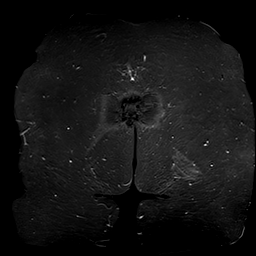
[im 6/36]
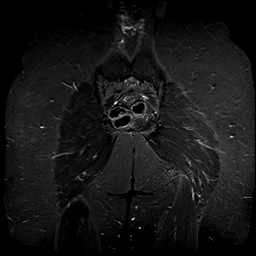
[im 11/36]
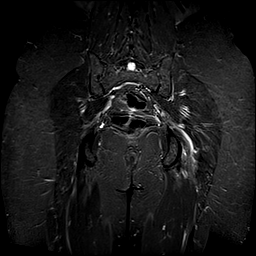
[im 16/36]
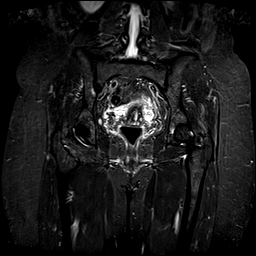

[38 of 48 positions shown; findings below may reference images not displayed]

FINDINGS: Bones/Joint/Cartilage

Marrow signal is normal throughout without fracture, stress change
or focal lesion. No subchondral cyst formation or edema about the
hips. No avascular necrosis of the femoral heads. Visualized lower
lumbar spine is unremarkable.

Ligaments

Intact and normal in appearance.

Muscles and Tendons

A small amount of fluid is seen about the distal gluteus medius and
minimus tendons bilaterally compatible with tendinosis, more notable
on the right. No tear is identified.

Soft tissues

Negative. A marker is placed over the region of concern peripheral
to the right ilium. No underlying fluid collection, mass or other
abnormality is seen.
IMPRESSION: No abnormality is seen about a marker placed in the region of
concern.

Mild appearing bilateral gluteus medius and minimus tendinosis
without tear is more notable on the right.

## 2019-12-21 DIAGNOSIS — M25512 Pain in left shoulder: Secondary | ICD-10-CM | POA: Diagnosis not present

## 2019-12-28 DIAGNOSIS — M25512 Pain in left shoulder: Secondary | ICD-10-CM | POA: Diagnosis not present

## 2020-01-31 DIAGNOSIS — Z20822 Contact with and (suspected) exposure to covid-19: Secondary | ICD-10-CM | POA: Diagnosis not present

## 2020-01-31 DIAGNOSIS — J069 Acute upper respiratory infection, unspecified: Secondary | ICD-10-CM | POA: Diagnosis not present

## 2020-02-03 DIAGNOSIS — Z20822 Contact with and (suspected) exposure to covid-19: Secondary | ICD-10-CM | POA: Diagnosis not present

## 2020-02-03 DIAGNOSIS — J029 Acute pharyngitis, unspecified: Secondary | ICD-10-CM | POA: Diagnosis not present

## 2020-04-20 ENCOUNTER — Ambulatory Visit (INDEPENDENT_AMBULATORY_CARE_PROVIDER_SITE_OTHER): Payer: BC Managed Care – PPO | Admitting: Nurse Practitioner

## 2020-04-20 ENCOUNTER — Encounter: Payer: Self-pay | Admitting: Nurse Practitioner

## 2020-04-20 ENCOUNTER — Other Ambulatory Visit: Payer: Self-pay

## 2020-04-20 VITALS — BP 117/72 | HR 72 | Temp 97.4°F | Ht 61.0 in | Wt 150.7 lb

## 2020-04-20 DIAGNOSIS — E7800 Pure hypercholesterolemia, unspecified: Secondary | ICD-10-CM | POA: Diagnosis not present

## 2020-04-20 DIAGNOSIS — R5383 Other fatigue: Secondary | ICD-10-CM

## 2020-04-20 DIAGNOSIS — E559 Vitamin D deficiency, unspecified: Secondary | ICD-10-CM

## 2020-04-20 DIAGNOSIS — Z7689 Persons encountering health services in other specified circumstances: Secondary | ICD-10-CM | POA: Diagnosis not present

## 2020-04-20 DIAGNOSIS — Z0000 Encounter for general adult medical examination without abnormal findings: Secondary | ICD-10-CM

## 2020-04-20 DIAGNOSIS — I10 Essential (primary) hypertension: Secondary | ICD-10-CM | POA: Diagnosis not present

## 2020-04-20 DIAGNOSIS — F330 Major depressive disorder, recurrent, mild: Secondary | ICD-10-CM

## 2020-04-20 DIAGNOSIS — Z6828 Body mass index (BMI) 28.0-28.9, adult: Secondary | ICD-10-CM

## 2020-04-20 MED ORDER — FLUOXETINE HCL 40 MG PO CAPS: 40.0000 mg | ORAL_CAPSULE | Freq: Every day | ORAL | 1 refills | Status: DC

## 2020-04-20 MED ORDER — LISINOPRIL-HYDROCHLOROTHIAZIDE 20-12.5 MG PO TABS: 1.0000 | ORAL_TABLET | Freq: Every day | ORAL | 1 refills | Status: DC

## 2020-04-20 MED ORDER — PHENTERMINE HCL 37.5 MG PO TABS: 37.5000 mg | ORAL_TABLET | Freq: Every day | ORAL | 0 refills | Status: DC

## 2020-04-20 NOTE — Progress Notes (Signed)
New Patient Office Visit  Subjective:  Patient ID: Monica Reed, female    DOB: Jun 18, 1970  Age: 50 y.o. MRN: [id number]  CC:  Chief Complaint  Patient presents with  . New Patient (Initial Visit)    HPI [name] presents to reestablish primary care provider. Today, she is concerned about new onset of headache. She states that the first one she had was in the beginning of the month. Describes the pain as a throbbing and pounding type pain. She also hurt in the neck. She states that she took tylenol and advil and neither helped the headache. She had to lay down in dark and quiet room. She reports a second, similar headache which started on this past Mondsy. This one last for three days. She missed work yesterday as pain was so severe. She states that her headache is much improved today. She states that she had never suffered from headaches in the past. When asked if there was anything different going on, such as food, lifestyle, or medication, she mentioned that she was recently started on Diclofenac when needed for pain in the left shoulder. She states that when looking back, she did take this medication prior to her first headache. She also took it this past Sunday, one day prior to the onset of the recent headache. Together, we loked up possible side effects related to taking Diclofenac. Both headache and photophobia were listed as common side effects.  She states that she is concerned about weight gain. A few years back, she was seen in this office and weighed 165. She was started on phentermine. She was going to the gym regularly and eating a low-calorie, well-balanced diet. She lost 30 pounds and no longer needed the medication. Was able to keep up healthy routine, When COVID 19 started and gyms closed their doors for some time, patient states that she did not work out at home. She started increasing her calorie intake. She has gained back to 150 pounds. She states that she  and her daughter have started going back to the gym. She states that after this appointment, they will be headed to the gym. She has started to cut back on calorie intake again. She states that when she was on phentermine, she did well with it and had no negative side effects that she can remember.  She is due to have routine, fasting labs. She does see a GYN provider who does well-woman care for her. The patient states that she did get vaccinated against COVID 19. She declines flu shot and has not ever received a flu shot.    Past Medical History:  Diagnosis Date  . Frequent UTI   . Hypertension     History reviewed. No pertinent surgical history.  Family History  Problem Relation Age of Onset  . Arthritis Mother   . Cancer Father        prostate  . Heart attack Father   . Healthy Sister   . Healthy Brother   . Hypertension Daughter   . Healthy Brother   . Healthy Daughter   . Healthy Daughter     Social History   Socioeconomic History  . Marital status: Married    Spouse name: Not on file  . Number of children: Not on file  . Years of education: Not on file  . Highest education level: Not on file  Occupational History  . Not on file  Tobacco Use  . Smoking status: Never  Smoker  . Smokeless tobacco: Never Used  Vaping Use  . Vaping Use: Never used  Substance and Sexual Activity  . Alcohol use: No  . Drug use: No  . Sexual activity: Yes    Partners: Male    Birth control/protection: Pill  Other Topics Concern  . Not on file  Social History Narrative  . Not on file   Social Determinants of Health   Financial Resource Strain: Not on file  Food Insecurity: Not on file  Transportation Needs: Not on file  Physical Activity: Not on file  Stress: Not on file  Social Connections: Not on file  Intimate Partner Violence: Not on file    ROS Review of Systems  Constitutional: Positive for fatigue. Negative for activity change, chills and fever.  HENT: Negative  for congestion, sinus pressure, sinus pain and sneezing.   Eyes: Negative.   Respiratory: Negative for cough, chest tightness and wheezing.   Cardiovascular: Negative for chest pain and palpitations.  Gastrointestinal: Negative for constipation, diarrhea, nausea and vomiting.  Endocrine: Negative.   Genitourinary: Negative.   Musculoskeletal: Negative for back pain and myalgias.  Skin: Negative for rash.  Allergic/Immunologic: Positive for environmental allergies.  Neurological: Positive for headaches. Negative for dizziness and weakness.  Psychiatric/Behavioral: The patient is nervous/anxious.        Well managed on fluoxetine.   All other systems reviewed and are negative.   Objective:   Today's Vitals   04/20/20 0838  BP: 117/72  Pulse: 72  Temp: (!) 97.4 F (36.3 C)  SpO2: 97%  Weight: 150 lb 11.2 oz (68.4 kg)  Height: 5\' 1"  (1.549 m)   Body mass index is 28.47 kg/m.   Physical Exam Vitals and nursing note reviewed.  Constitutional:      Appearance: Normal appearance. She is well-developed.  HENT:     Head: Normocephalic.     Nose: Nose normal.     Mouth/Throat:     Mouth: Mucous membranes are moist.     Pharynx: Oropharynx is clear.  Eyes:     Conjunctiva/sclera: Conjunctivae normal.     Pupils: Pupils are equal, round, and reactive to light.  Neck:     Vascular: No carotid bruit.  Cardiovascular:     Rate and Rhythm: Normal rate and regular rhythm.  Pulmonary:     Effort: Pulmonary effort is normal.     Breath sounds: Normal breath sounds.  Abdominal:     Palpations: Abdomen is soft.  Musculoskeletal:        General: Normal range of motion.     Cervical back: Normal range of motion and neck supple.  Skin:    General: Skin is warm and dry.     Capillary Refill: Capillary refill takes less than 2 seconds.  Neurological:     General: No focal deficit present.     Mental Status: She is alert and oriented to person, place, and time.  Psychiatric:         Mood and Affect: Mood normal.        Behavior: Behavior normal.        Thought Content: Thought content normal.        Judgment: Judgment normal.      Assessment & Plan:  1. Encounter to establish care Encounter to establish new primary care provider.   2. Hypertension, unspecified type Well managed. Continue bp medication as prescribed. Check CBC and CMP - CBC with Differential/Platelet - Comprehensive metabolic panel - lisinopril-hydrochlorothiazide (ZESTORETIC) 20-12.5  MG tablet; Take 1 tablet by mouth daily.  Dispense: 90 tablet; Refill: 1  3. Elevated LDL cholesterol level Check fasting lipid panel. Treat as indicated.  - Lipid panel  4. Vitamin D deficiency Check Vitamin d level.  - Vitamin D 1,25 dihydroxy  5. BMI 28.0-28.9,adult Trial phentermine 37.5 mg daily. Patient to limit calorie intake to 1200 calories per day and incorporate exercise into her daily routine. Reviewed risks and potential side effects associated with taking stimulant appetite suppressants. She voiced understanding and agreement with the plan.  - phentermine (ADIPEX-P) 37.5 MG tablet; Take 1 tablet (37.5 mg total) by mouth daily before breakfast.  Dispense: 30 tablet; Refill: 0  6. Mild recurrent major depression (HCC) Continue fluoxetine as prescribed. Refills provided today.  - FLUoxetine (PROZAC) 40 MG capsule; Take 1 capsule (40 mg total) by mouth daily.  Dispense: 90 capsule; Refill: 1  7. Other fatigue Check thyroid panel.  - T4, free - TSH  8. Healthcare maintenance Check labs for further evaluation.  - CBC with Differential/Platelet - Comprehensive metabolic panel - T4, free - TSH   Problem List Items Addressed This Visit      Cardiovascular and Mediastinum   Hypertension   Relevant Medications   lisinopril-hydrochlorothiazide (ZESTORETIC) 20-12.5 MG tablet   Other Relevant Orders   CBC with Differential/Platelet (Completed)   Comprehensive metabolic panel (Completed)      Other   Elevated LDL cholesterol level (Chronic)   Relevant Orders   Lipid panel (Completed)   Healthcare maintenance   Relevant Orders   CBC with Differential/Platelet (Completed)   Comprehensive metabolic panel (Completed)   T4, free (Completed)   TSH (Completed)   Mild recurrent major depression (HCC)   Relevant Medications   FLUoxetine (PROZAC) 40 MG capsule   Encounter to establish care - Primary   Vitamin D deficiency   Relevant Orders   Vitamin D 1,25 dihydroxy (Completed)   BMI 28.0-28.9,adult   Relevant Medications   phentermine (ADIPEX-P) 37.5 MG tablet   Other fatigue   Relevant Orders   T4, free (Completed)   TSH (Completed)      Outpatient Encounter Medications as of 04/20/2020  Medication Sig  . loratadine (CLARITIN) 10 MG tablet Take 10 mg by mouth daily.  . norethindrone-ethinyl estradiol (LOESTRIN FE) 1-20 MG-MCG tablet Take 1 tablet by mouth daily.  . phentermine (ADIPEX-P) 37.5 MG tablet Take 1 tablet (37.5 mg total) by mouth daily before breakfast.  . [DISCONTINUED] FLUoxetine (PROZAC) 40 MG capsule Take 1 capsule (40 mg total) by mouth daily.  . [DISCONTINUED] lisinopril-hydrochlorothiazide (ZESTORETIC) 20-12.5 MG tablet Take 1 tablet by mouth daily.  Marland Kitchen FLUoxetine (PROZAC) 40 MG capsule Take 1 capsule (40 mg total) by mouth daily.  Marland Kitchen lisinopril-hydrochlorothiazide (ZESTORETIC) 20-12.5 MG tablet Take 1 tablet by mouth daily.   No facility-administered encounter medications on file as of 04/20/2020.   Time spent with the patient was approximately 45 minutes. This time included reviewing progress notes, labs, imaging studies, and discussing plan for follow up.   Follow-up: Return in about 4 weeks (around 05/18/2020) for weight loss.   Carlean Jews, NP

## 2020-04-20 NOTE — Patient Instructions (Signed)
Calorie Counting for Weight Loss Calories are units of energy. Your body needs a certain number of calories from food to keep going throughout the day. When you eat or drink more calories than your body needs, your body stores the extra calories mostly as fat. When you eat or drink fewer calories than your body needs, your body burns fat to get the energy it needs. Calorie counting means keeping track of how many calories you eat and drink each day. Calorie counting can be helpful if you need to lose weight. If you eat fewer calories than your body needs, you should lose weight. Ask your health care provider what a healthy weight is for you. For calorie counting to work, you will need to eat the right number of calories each day to lose a healthy amount of weight per week. A dietitian can help you figure out how many calories you need in a day and will suggest ways to reach your calorie goal.  A healthy amount of weight to lose each week is usually 1-2 lb (0.5-0.9 kg). This usually means that your daily calorie intake should be reduced by 500-750 calories.  Eating 1,200-1,500 calories a day can help most women lose weight.  Eating 1,500-1,800 calories a day can help most men lose weight. What do I need to know about calorie counting? Work with your health care provider or dietitian to determine how many calories you should get each day. To meet your daily calorie goal, you will need to:  Find out how many calories are in each food that you would like to eat. Try to do this before you eat.  Decide how much of the food you plan to eat.  Keep a food log. Do this by writing down what you ate and how many calories it had. To successfully lose weight, it is important to balance calorie counting with a healthy lifestyle that includes regular activity. Where do I find calorie information? The number of calories in a food can be found on a Nutrition Facts label. If a food does not have a Nutrition Facts  label, try to look up the calories online or ask your dietitian for help. Remember that calories are listed per serving. If you choose to have more than one serving of a food, you will have to multiply the calories per serving by the number of servings you plan to eat. For example, the label on a package of bread might say that a serving size is 1 slice and that there are 90 calories in a serving. If you eat 1 slice, you will have eaten 90 calories. If you eat 2 slices, you will have eaten 180 calories.   How do I keep a food log? After each time that you eat, record the following in your food log as soon as possible:  What you ate. Be sure to include toppings, sauces, and other extras on the food.  How much you ate. This can be measured in cups, ounces, or number of items.  How many calories were in each food and drink.  The total number of calories in the food you ate. Keep your food log near you, such as in a pocket-sized notebook or on an app or website on your mobile phone. Some programs will calculate calories for you and show you how many calories you have left to meet your daily goal. What are some portion-control tips?  Know how many calories are in a serving. This will   help you know how many servings you can have of a certain food.  Use a measuring cup to measure serving sizes. You could also try weighing out portions on a kitchen scale. With time, you will be able to estimate serving sizes for some foods.  Take time to put servings of different foods on your favorite plates or in your favorite bowls and cups so you know what a serving looks like.  Try not to eat straight from a food's packaging, such as from a bag or box. Eating straight from the package makes it hard to see how much you are eating and can lead to overeating. Put the amount you would like to eat in a cup or on a plate to make sure you are eating the right portion.  Use smaller plates, glasses, and bowls for smaller  portions and to prevent overeating.  Try not to multitask. For example, avoid watching TV or using your computer while eating. If it is time to eat, sit down at a table and enjoy your food. This will help you recognize when you are full. It will also help you be more mindful of what and how much you are eating. What are tips for following this plan? Reading food labels  Check the calorie count compared with the serving size. The serving size may be smaller than what you are used to eating.  Check the source of the calories. Try to choose foods that are high in protein, fiber, and vitamins, and low in saturated fat, trans fat, and sodium. Shopping  Read nutrition labels while you shop. This will help you make healthy decisions about which foods to buy.  Pay attention to nutrition labels for low-fat or fat-free foods. These foods sometimes have the same number of calories or more calories than the full-fat versions. They also often have added sugar, starch, or salt to make up for flavor that was removed with the fat.  Make a grocery list of lower-calorie foods and stick to it. Cooking  Try to cook your favorite foods in a healthier way. For example, try baking instead of frying.  Use low-fat dairy products. Meal planning  Use more fruits and vegetables. One-half of your plate should be fruits and vegetables.  Include lean proteins, such as chicken, turkey, and fish. Lifestyle Each week, aim to do one of the following:  150 minutes of moderate exercise, such as walking.  75 minutes of vigorous exercise, such as running. General information  Know how many calories are in the foods you eat most often. This will help you calculate calorie counts faster.  Find a way of tracking calories that works for you. Get creative. Try different apps or programs if writing down calories does not work for you. What foods should I eat?  Eat nutritious foods. It is better to have a nutritious,  high-calorie food, such as an avocado, than a food with few nutrients, such as a bag of potato chips.  Use your calories on foods and drinks that will fill you up and will not leave you hungry soon after eating. ? Examples of foods that fill you up are nuts and nut butters, vegetables, lean proteins, and high-fiber foods such as whole grains. High-fiber foods are foods with more than 5 g of fiber per serving.  Pay attention to calories in drinks. Low-calorie drinks include water and unsweetened drinks. The items listed above may not be a complete list of foods and beverages you can eat.   Contact a dietitian for more information.   What foods should I limit? Limit foods or drinks that are not good sources of vitamins, minerals, or protein or that are high in unhealthy fats. These include:  Candy.  Other sweets.  Sodas, specialty coffee drinks, alcohol, and juice. The items listed above may not be a complete list of foods and beverages you should avoid. Contact a dietitian for more information. How do I count calories when eating out?  Pay attention to portions. Often, portions are much larger when eating out. Try these tips to keep portions smaller: ? Consider sharing a meal instead of getting your own. ? If you get your own meal, eat only half of it. Before you start eating, ask for a container and put half of your meal into it. ? When available, consider ordering smaller portions from the menu instead of full portions.  Pay attention to your food and drink choices. Knowing the way food is cooked and what is included with the meal can help you eat fewer calories. ? If calories are listed on the menu, choose the lower-calorie options. ? Choose dishes that include vegetables, fruits, whole grains, low-fat dairy products, and lean proteins. ? Choose items that are boiled, broiled, grilled, or steamed. Avoid items that are buttered, battered, fried, or served with cream sauce. Items labeled as  crispy are usually fried, unless stated otherwise. ? Choose water, low-fat milk, unsweetened iced tea, or other drinks without added sugar. If you want an alcoholic beverage, choose a lower-calorie option, such as a glass of wine or light beer. ? Ask for dressings, sauces, and syrups on the side. These are usually high in calories, so you should limit the amount you eat. ? If you want a salad, choose a garden salad and ask for grilled meats. Avoid extra toppings such as bacon, cheese, or fried items. Ask for the dressing on the side, or ask for olive oil and vinegar or lemon to use as dressing.  Estimate how many servings of a food you are given. Knowing serving sizes will help you be aware of how much food you are eating at restaurants. Where to find more information  Centers for Disease Control and Prevention: www.cdc.gov  [place] Department of Agriculture: myplate.gov Summary  Calorie counting means keeping track of how many calories you eat and drink each day. If you eat fewer calories than your body needs, you should lose weight.  A healthy amount of weight to lose per week is usually 1-2 lb (0.5-0.9 kg). This usually means reducing your daily calorie intake by 500-750 calories.  The number of calories in a food can be found on a Nutrition Facts label. If a food does not have a Nutrition Facts label, try to look up the calories online or ask your dietitian for help.  Use smaller plates, glasses, and bowls for smaller portions and to prevent overeating.  Use your calories on foods and drinks that will fill you up and not leave you hungry shortly after a meal. This information is not intended to replace advice given to you by your health care provider. Make sure you discuss any questions you have with your health care provider. Document Revised: 02/18/2019 Document Reviewed: 02/18/2019 Elsevier Patient Education  2021 [employer]  

## 2020-04-21 ENCOUNTER — Encounter: Payer: Self-pay | Admitting: Nurse Practitioner

## 2020-04-21 NOTE — Progress Notes (Signed)
Moderate elevation of cholesterol. Will discuss and start medication at next visit 05/16/2020

## 2020-04-30 DIAGNOSIS — Z7689 Persons encountering health services in other specified circumstances: Secondary | ICD-10-CM | POA: Insufficient documentation

## 2020-04-30 DIAGNOSIS — E559 Vitamin D deficiency, unspecified: Secondary | ICD-10-CM | POA: Insufficient documentation

## 2020-04-30 DIAGNOSIS — Z6828 Body mass index (BMI) 28.0-28.9, adult: Secondary | ICD-10-CM | POA: Insufficient documentation

## 2020-04-30 DIAGNOSIS — Z6826 Body mass index (BMI) 26.0-26.9, adult: Secondary | ICD-10-CM | POA: Insufficient documentation

## 2020-04-30 DIAGNOSIS — R5383 Other fatigue: Secondary | ICD-10-CM | POA: Insufficient documentation

## 2020-04-30 LAB — TSH: TSH: 1.4 u[IU]/mL (ref 0.450–4.500)

## 2020-04-30 LAB — CBC WITH DIFFERENTIAL/PLATELET
Basophils Absolute: 0 10*3/uL (ref 0.0–0.2)
Basos: 0 %
EOS (ABSOLUTE): 0.1 10*3/uL (ref 0.0–0.4)
Eos: 1 %
Hematocrit: 45.8 % (ref 34.0–46.6)
Hemoglobin: 15.5 g/dL (ref 11.1–15.9)
Immature Grans (Abs): 0 10*3/uL (ref 0.0–0.1)
Immature Granulocytes: 0 %
Lymphocytes Absolute: 2.4 10*3/uL (ref 0.7–3.1)
Lymphs: 34 %
MCH: 31.6 pg (ref 26.6–33.0)
MCHC: 33.8 g/dL (ref 31.5–35.7)
MCV: 93 fL (ref 79–97)
Monocytes Absolute: 0.3 10*3/uL (ref 0.1–0.9)
Monocytes: 5 %
Neutrophils Absolute: 4.2 10*3/uL (ref 1.4–7.0)
Neutrophils: 60 %
Platelets: 344 10*3/uL (ref 150–450)
RBC: 4.91 x10E6/uL (ref 3.77–5.28)
RDW: 12.6 % (ref 11.7–15.4)
WBC: 7.1 10*3/uL (ref 3.4–10.8)

## 2020-04-30 LAB — VITAMIN D 1,25 DIHYDROXY
Vitamin D 1, 25 (OH)2 Total: 27 pg/mL
Vitamin D2 1, 25 (OH)2: <10 pg/mL
Vitamin D3 1, 25 (OH)2: 27 pg/mL

## 2020-04-30 LAB — COMPREHENSIVE METABOLIC PANEL
ALT: 9 IU/L (ref 0–32)
AST: 15 IU/L (ref 0–40)
Albumin/Globulin Ratio: 1.4 (ref 1.2–2.2)
Albumin: 4.5 g/dL (ref 3.8–4.8)
Alkaline Phosphatase: 62 IU/L (ref 44–121)
BUN/Creatinine Ratio: 22 (ref 9–23)
BUN: 16 mg/dL (ref 6–24)
Bilirubin Total: 0.4 mg/dL (ref 0.0–1.2)
CO2: 22 mmol/L (ref 20–29)
Calcium: 9.6 mg/dL (ref 8.7–10.2)
Chloride: 98 mmol/L (ref 96–106)
Creatinine, Ser: 0.73 mg/dL (ref 0.57–1.00)
Globulin, Total: 3.2 g/dL (ref 1.5–4.5)
Glucose: 75 mg/dL (ref 65–99)
Potassium: 4.5 mmol/L (ref 3.5–5.2)
Sodium: 137 mmol/L (ref 134–144)
Total Protein: 7.7 g/dL (ref 6.0–8.5)
eGFR: 101 mL/min/{1.73_m2} (ref >59)

## 2020-04-30 LAB — LIPID PANEL
Chol/HDL Ratio: 4.9 ratio — ABNORMAL HIGH (ref 0.0–4.4)
Cholesterol, Total: 277 mg/dL — ABNORMAL HIGH (ref 100–199)
HDL: 56 mg/dL (ref >39)
LDL Chol Calc (NIH): 185 mg/dL — ABNORMAL HIGH (ref 0–99)
Triglycerides: 193 mg/dL — ABNORMAL HIGH (ref 0–149)
VLDL Cholesterol Cal: 36 mg/dL (ref 5–40)

## 2020-04-30 LAB — T4, FREE: Free T4: 1.06 ng/dL (ref 0.82–1.77)

## 2020-04-30 MED ORDER — ROSUVASTATIN CALCIUM 5 MG PO TABS: 5.0000 mg | ORAL_TABLET | Freq: Every day | ORAL | 1 refills | Status: DC

## 2020-05-16 ENCOUNTER — Other Ambulatory Visit: Payer: Self-pay

## 2020-05-16 ENCOUNTER — Ambulatory Visit: Payer: BC Managed Care – PPO | Admitting: Nurse Practitioner

## 2020-05-16 ENCOUNTER — Encounter: Payer: Self-pay | Admitting: Nurse Practitioner

## 2020-05-16 VITALS — BP 106/67 | HR 77 | Temp 97.5°F | Ht 60.0 in | Wt 145.3 lb

## 2020-05-16 DIAGNOSIS — Z6828 Body mass index (BMI) 28.0-28.9, adult: Secondary | ICD-10-CM | POA: Diagnosis not present

## 2020-05-16 DIAGNOSIS — E782 Mixed hyperlipidemia: Secondary | ICD-10-CM | POA: Diagnosis not present

## 2020-05-16 DIAGNOSIS — I10 Essential (primary) hypertension: Secondary | ICD-10-CM | POA: Diagnosis not present

## 2020-05-16 MED ORDER — PHENTERMINE HCL 37.5 MG PO TABS: 37.5000 mg | ORAL_TABLET | Freq: Every day | ORAL | 0 refills | Status: DC

## 2020-05-16 NOTE — Progress Notes (Signed)
Established Patient Office Visit  Subjective:  Patient ID: Monica Reed, female    DOB: 1970/04/18  Age: 50 y.o. MRN: [id number]  CC:  Chief Complaint  Patient presents with  . Follow-up    Weight Loss    HPI Monica Reed presents for follow up of labs and weight management. She did trial of phentermine 37.$RemoveBeforeDEI'5mg'YOkDhkPrtvcnViXi$  tablets daily for past one month. She has cut calorie intake. She is avoiding excess intake of carbohydrates and sugar. She has started going to the gym one to two times per week. She states that she takes part in exercise class for 30 minutes, then uses elliptical or stair stepper after that. She has lost five pounds since her last visit. She reports no negative side effects associated with taking phentermine.  Labs were drawn prior to this visit. Her lipid panel was elevated. Her total cholesterol was 277, with triglycerides at 193, and LDL at 155. Her CHOL/LDL ratio is 4.9 indicating increased risk of developing CAD related to lipid rich plaque. She was started on rosuvastatin $RemoveBeforeD'5mg'vPNqfjriQmpZNv$  every evening. She is making dietary changes and exercising more frequently. She has already lost 5 pounds. She states that she is tolerating the crestor well without negative side effects.  Her other lab results were essentially normal.  The patient has no new concerns or complaints today.   Past Medical History:  Diagnosis Date  . Frequent UTI   . Hypertension     History reviewed. No pertinent surgical history.  Family History  Problem Relation Age of Onset  . Arthritis Mother   . Cancer Father        prostate  . Heart attack Father   . Healthy Sister   . Healthy Brother   . Hypertension Daughter   . Healthy Brother   . Healthy Daughter   . Healthy Daughter     Social History   Socioeconomic History  . Marital status: Married    Spouse name: Not on file  . Number of children: Not on file  . Years of education: Not on file  . Highest education level: Not on file   Occupational History  . Not on file  Tobacco Use  . Smoking status: Never Smoker  . Smokeless tobacco: Never Used  Vaping Use  . Vaping Use: Never used  Substance and Sexual Activity  . Alcohol use: No  . Drug use: No  . Sexual activity: Yes    Partners: Male    Birth control/protection: Pill  Other Topics Concern  . Not on file  Social History Narrative  . Not on file   Social Determinants of Health   Financial Resource Strain: Not on file  Food Insecurity: Not on file  Transportation Needs: Not on file  Physical Activity: Not on file  Stress: Not on file  Social Connections: Not on file  Intimate Partner Violence: Not on file    Outpatient Medications Prior to Visit  Medication Sig Dispense Refill  . FLUoxetine (PROZAC) 40 MG capsule Take 1 capsule (40 mg total) by mouth daily. 90 capsule 1  . lisinopril-hydrochlorothiazide (ZESTORETIC) 20-12.5 MG tablet Take 1 tablet by mouth daily. 90 tablet 1  . loratadine (CLARITIN) 10 MG tablet Take 10 mg by mouth daily.    . norethindrone-ethinyl estradiol (LOESTRIN FE) 1-20 MG-MCG tablet Take 1 tablet by mouth daily.    . rosuvastatin (CRESTOR) 5 MG tablet Take 1 tablet (5 mg total) by mouth daily. 90 tablet 1  . phentermine (  ADIPEX-P) 37.5 MG tablet Take 1 tablet (37.5 mg total) by mouth daily before breakfast. 30 tablet 0   No facility-administered medications prior to visit.    Allergies  Allergen Reactions  . Cyclobenzaprine Swelling    ROS Review of Systems  Constitutional: Negative for chills and fever.       Five pound weight loss since her last visit.   HENT: Negative for congestion, postnasal drip, rhinorrhea, sinus pressure and sore throat.   Eyes: Negative.   Respiratory: Negative for cough, shortness of breath and wheezing.   Cardiovascular: Negative for chest pain and palpitations.  Gastrointestinal: Negative for constipation, diarrhea, nausea and vomiting.  Endocrine: Negative.   Musculoskeletal:  Negative for back pain and myalgias.  Skin: Negative for rash.  Allergic/Immunologic: Negative for environmental allergies.  Neurological: Negative for dizziness, weakness and headaches.  Hematological: Negative.   Psychiatric/Behavioral: Negative.  The patient is not nervous/anxious.   All other systems reviewed and are negative.     Objective:    Physical Exam Vitals and nursing note reviewed.  Constitutional:      Appearance: Normal appearance. She is well-developed.  HENT:     Head: Normocephalic and atraumatic.  Eyes:     Extraocular Movements: Extraocular movements intact.     Conjunctiva/sclera: Conjunctivae normal.     Pupils: Pupils are equal, round, and reactive to light.  Cardiovascular:     Rate and Rhythm: Normal rate and regular rhythm.     Pulses: Normal pulses.     Heart sounds: Normal heart sounds.  Pulmonary:     Effort: Pulmonary effort is normal.     Breath sounds: Normal breath sounds.  Abdominal:     Palpations: Abdomen is soft.  Musculoskeletal:        General: Normal range of motion.     Cervical back: Normal range of motion and neck supple.  Skin:    General: Skin is warm and dry.  Neurological:     General: No focal deficit present.     Mental Status: She is alert and oriented to person, place, and time.  Psychiatric:        Mood and Affect: Mood normal.        Behavior: Behavior normal.        Thought Content: Thought content normal.        Judgment: Judgment normal.     Today's Vitals   05/16/20 0900  BP: 106/67  Pulse: 77  Temp: (!) 97.5 F (36.4 C)  SpO2: 98%  Weight: 145 lb 4.8 oz (65.9 kg)  Height: 5' (1.524 m)   Body mass index is 28.38 kg/m.   Wt Readings from Last 3 Encounters:  05/16/20 145 lb 4.8 oz (65.9 kg)  04/20/20 150 lb 11.2 oz (68.4 kg)  04/21/19 143 lb (64.9 kg)     Health Maintenance Due  Topic Date Due  . Hepatitis C Screening  Never done  . COLONOSCOPY (Pts 45-50yr Insurance coverage will need to be  confirmed)  Never done  . COVID-19 Vaccine (3 - Pfizer risk 4-dose series) 07/01/2019    There are no preventive care reminders to display for this patient.  Lab Results  Component Value Date   TSH 1.400 04/20/2020   Lab Results  Component Value Date   WBC 7.1 04/20/2020   HGB 15.5 04/20/2020   HCT 45.8 04/20/2020   MCV 93 04/20/2020   PLT 344 04/20/2020   Lab Results  Component Value Date   NA 137  04/20/2020   K 4.5 04/20/2020   CO2 22 04/20/2020   GLUCOSE 75 04/20/2020   BUN 16 04/20/2020   CREATININE 0.73 04/20/2020   BILITOT 0.4 04/20/2020   ALKPHOS 62 04/20/2020   AST 15 04/20/2020   ALT 9 04/20/2020   PROT 7.7 04/20/2020   ALBUMIN 4.5 04/20/2020   CALCIUM 9.6 04/20/2020   EGFR 101 04/20/2020   Lab Results  Component Value Date   CHOL 277 (H) 04/20/2020   Lab Results  Component Value Date   HDL 56 04/20/2020   Lab Results  Component Value Date   LDLCALC 185 (H) 04/20/2020   Lab Results  Component Value Date   TRIG 193 (H) 04/20/2020   Lab Results  Component Value Date   CHOLHDL 4.9 (H) 04/20/2020   Lab Results  Component Value Date   HGBA1C 5.2 09/21/2018      Assessment & Plan:  1. Mixed hyperlipidemia Reviewed labs with patient showing moderate elevation of lipid panel. Her CHOL/LDL ratio is 4.9, indicating above average risk fr developing CAD due to high cholesterol. She has been started on crestor 75m and is tolerating this medication well. Will recheck lipid panel in about 2 months and adjust dosing as indicated. In meantime, she should continue with dietary changes and increased exercise. Limit intake of fried and fatty foods.   2. Primary hypertension Stable. Continue bp medication as prescribed   3. BMI 28.0-28.9,adult Improving. Patient has had a five pound weight loss since her last visit. Will continue phentermine for another 30 days. Continue with dietary changes and increased exercise. Drink plenty of water. Will follow up in 1  month. - phentermine (ADIPEX-P) 37.5 MG tablet; Take 1 tablet (37.5 mg total) by mouth daily before breakfast.  Dispense: 30 tablet; Refill: 0  Problem List Items Addressed This Visit      Cardiovascular and Mediastinum   Hypertension     Other   BMI 28.0-28.9,adult   Relevant Medications   phentermine (ADIPEX-P) 37.5 MG tablet   Mixed hyperlipidemia - Primary      Meds ordered this encounter  Medications  . phentermine (ADIPEX-P) 37.5 MG tablet    Sig: Take 1 tablet (37.5 mg total) by mouth daily before breakfast.    Dispense:  30 tablet    Refill:  0    Order Specific Question:   Supervising Provider    Answer:   MBeatrice LecherD [[id number]]    Follow-up: Return in about 4 weeks (around 06/13/2020) for weight loss .    HRonnell Freshwater NP

## 2020-05-16 NOTE — Patient Instructions (Signed)
Familial Hypercholesterolemia Familial hypercholesterolemia (FH) is a genetic disorder that causes a very high level of LDL (low-density lipoprotein) cholesterol. Cholesterol is a waxy, fat-like substance that your body needs to build cells. Your body makes all the cholesterol it needs in the liver and removes extra (excess) cholesterol from the blood as needed. Excess cholesterol comes from food that you eat. In people who have FH, the body is not able to remove LDL cholesterol from the blood as it should. A high level of LDL cholesterol puts you at higher risk for narrowing and hardening of your arteries (atherosclerosis) at an early age. This raises your risk for heart disease and stroke. What are the causes? FH is passed from parent to child (inherited). FH is caused by an inherited gene defect (genetic mutation) that makes it hard for the liver to remove LDL cholesterol from the blood. The gene may be inherited from one parent or both parents. What increases the risk? You may be at higher risk for FH if:  You have a family history of the condition. If both parents carry the genetic mutation, their children are at higher risk for a more severe form of FH, with symptoms that start at an earlier age. What are the signs or symptoms? You may have a high level of LDL cholesterol before you develop symptoms. Symptoms of FH may include:  Cholesterol nodules (xanthomas) on the cords of tissue that connect muscles to bones (tendons). Xanthomas often form on the long tendon at the back of the ankle (Achilles tendon) or on the tendons on the back of the hands.  Cholesterol deposits (xanthelasmas) under the skin of the eyelids.  A gray or blue ring around the white part of the eye (corneal arcus). Complications of FH can occur due to atherosclerosis. Atherosclerosis may cause damage to an area of the body that is not getting enough blood. Complications of FH may include:  Chest pain (angina) and shortness  of breath due to narrowed or blocked arteries in the heart (coronary artery disease).  Pain and cramping in the back of the lower legs (calves) when walking (claudication).  Interruption in blood flow to the brain (stroke). This may cause: ? Loss of balance. ? Vision loss. ? Sudden weakness or numbness on one side of the body. How is this diagnosed? This condition may be diagnosed based on:  Your symptoms.  Your medical history, including any family history of FH or early coronary heart disease.  A physical exam.  A blood test to check for the genetic mutation that causes FH. Your family members may also be tested. How is this treated? There is no cure for FH, but treatment can lower LDL cholesterol levels and lower your risk for heart attack or stroke. Treatment should be started as soon as you are diagnosed. Treatment may include:  A type of medicine that lowers your cholesterol (statin). If statins do not help, your health care provider may try other kinds of cholesterol-lowering medicines. The exact combination of medicines depends on the severity of your symptoms.  A procedure to filter LDL from your blood (apheresis). You may need this treatment if you have a severe form of FH.  Making lifestyle changes that are healthy for your heart, such as lowering the amount of fat and cholesterol in your diet. Follow these instructions at home: Lifestyle  Lose weight, if directed by your health care provider.  Follow instructions from your health care provider about eating a healthy diet. Your  health care provider may recommend: ? Working with a diet and nutrition specialist (dietitian), who can help you make a healthy eating plan and help you maintain a healthy weight. ? Eating less fat and cholesterol. Avoid fatty meats, fried foods, and whole-fat dairy. ? Eating more vegetables, fruits, and whole grains. ? Limiting your intake of alcohol.  Be physically active. Ask your health care  provider what type of exercise is best for you.  Do not use any products that contain nicotine or tobacco, such as cigarettes and e-cigarettes. If you need help quitting, ask your health care provider.  Work with your health care provider to manage any other conditions you have, such as high blood pressure (hypertension) or diabetes. These conditions affect your heart.   General instructions  Take over-the-counter and prescription medicines only as told by your health care provider.  Keep all follow-up visits as told by your health care provider. This is important. Contact a health care provider if:  You have pain or cramps in your calf when you walk. Get help right away if:  You have sudden, unexplained discomfort in your chest, arms, back, neck, jaw, or upper body.  You have trouble breathing.  You have a sudden, severe headache with no known cause.  You have any symptoms of a stroke. "BE FAST" is an easy way to remember the main warning signs of a stroke: ? B - Balance. Signs are dizziness, sudden trouble walking, or loss of balance. ? E - Eyes. Signs are trouble seeing or a sudden change in vision. ? F - Face. Signs are sudden weakness or numbness of the face, or the face or eyelid drooping on one side. ? A - Arms. Signs are weakness or numbness in an arm. This happens suddenly and usually on one side of the body. ? S - Speech. Signs are sudden trouble speaking, slurred speech, or trouble understanding what people say. ? T - Time. Time to call emergency services. Write down what time symptoms started. These symptoms may represent a serious problem that is an emergency. Do not wait to see if the symptoms will go away. Get medical help right away. Call your local emergency services (911 in the [place] Do not drive yourself to the hospital.   Summary  Familial hypercholesterolemia (FH) is a genetic disorder that causes a very high level of LDL (low-density lipoprotein)  cholesterol.  FH increases your risk for coronary heart disease and stroke at an early age.  Treatment for FH should be started as soon as you are diagnosed with the condition. Treatment is aimed at lowering your risk for complications.  Follow instructions from your health care provider about eating a healthy diet. Your health care provider may recommend eating less fat and cholesterol. This information is not intended to replace advice given to you by your health care provider. Make sure you discuss any questions you have with your health care provider. Document Revised: 05/04/2019 Document Reviewed: 05/04/2019 Elsevier Patient Education  2021 [employer].

## 2020-06-26 ENCOUNTER — Ambulatory Visit (INDEPENDENT_AMBULATORY_CARE_PROVIDER_SITE_OTHER): Payer: BC Managed Care – PPO | Admitting: Nurse Practitioner

## 2020-06-26 ENCOUNTER — Encounter: Payer: Self-pay | Admitting: Nurse Practitioner

## 2020-06-26 ENCOUNTER — Other Ambulatory Visit: Payer: Self-pay

## 2020-06-26 VITALS — BP 103/64 | HR 79 | Temp 96.1°F | Ht 60.0 in | Wt 141.8 lb

## 2020-06-26 DIAGNOSIS — Z6828 Body mass index (BMI) 28.0-28.9, adult: Secondary | ICD-10-CM

## 2020-06-26 DIAGNOSIS — Z6827 Body mass index (BMI) 27.0-27.9, adult: Secondary | ICD-10-CM | POA: Diagnosis not present

## 2020-06-26 DIAGNOSIS — E782 Mixed hyperlipidemia: Secondary | ICD-10-CM | POA: Diagnosis not present

## 2020-06-26 DIAGNOSIS — I10 Essential (primary) hypertension: Secondary | ICD-10-CM

## 2020-06-26 MED ORDER — PHENTERMINE HCL 37.5 MG PO TABS: 37.5000 mg | ORAL_TABLET | Freq: Every day | ORAL | 0 refills | Status: DC

## 2020-06-26 NOTE — Progress Notes (Signed)
Established Patient Office Visit  Subjective:  Patient ID: Monica Reed, female    DOB: 02-07-1970  Age: 50 y.o. MRN: [id number]  CC:  Chief Complaint  Patient presents with   Weight Check    HPI Monica Reed presents for evaluation of weight management. She was started on phentermine to facilitate weight loss on 04/20/2020. She lost five pounds her first month on this medication. She has lost an additional four pounds the last 30 days. She has about 10 more pounds to meet her goal weight of 130 pounds. She states that she has not been exercising like she should the past few weeks as she has been on vacation with her granddaughter. She continues to eat a healthy and low calorie diet. She states that she has no concerns or complaints taking this medication. She denies difficulty sleeping. She denies constipation. Her blood pressure is very well managed.  She has no concerns or complaints today.   Past Medical History:  Diagnosis Date   Frequent UTI    Hypertension     History reviewed. No pertinent surgical history.  Family History  Problem Relation Age of Onset   Arthritis Mother    Cancer Father        prostate   Heart attack Father    Healthy Sister    Healthy Brother    Hypertension Daughter    Healthy Brother    Healthy Daughter    Healthy Daughter     Social History   Socioeconomic History   Marital status: Married    Spouse name: Not on file   Number of children: Not on file   Years of education: Not on file   Highest education level: Not on file  Occupational History   Not on file  Tobacco Use   Smoking status: Never   Smokeless tobacco: Never  Vaping Use   Vaping Use: Never used  Substance and Sexual Activity   Alcohol use: No   Drug use: No   Sexual activity: Yes    Partners: Male    Birth control/protection: Pill  Other Topics Concern   Not on file  Social History Narrative   Not on file   Social Determinants of Health    Financial Resource Strain: Not on file  Food Insecurity: Not on file  Transportation Needs: Not on file  Physical Activity: Not on file  Stress: Not on file  Social Connections: Not on file  Intimate Partner Violence: Not on file    Outpatient Medications Prior to Visit  Medication Sig Dispense Refill   FLUoxetine (PROZAC) 40 MG capsule Take 1 capsule (40 mg total) by mouth daily. 90 capsule 1   lisinopril-hydrochlorothiazide (ZESTORETIC) 20-12.5 MG tablet Take 1 tablet by mouth daily. 90 tablet 1   loratadine (CLARITIN) 10 MG tablet Take 10 mg by mouth daily.     norethindrone-ethinyl estradiol (LOESTRIN FE) 1-20 MG-MCG tablet Take 1 tablet by mouth daily.     rosuvastatin (CRESTOR) 5 MG tablet Take 1 tablet (5 mg total) by mouth daily. 90 tablet 1   phentermine (ADIPEX-P) 37.5 MG tablet Take 1 tablet (37.5 mg total) by mouth daily before breakfast. 30 tablet 0   No facility-administered medications prior to visit.    Allergies  Allergen Reactions   Cyclobenzaprine Swelling    ROS Review of Systems  Constitutional:  Negative for activity change, chills and fever.       Four pound weight loss since her most recent visit.  HENT:  Negative for congestion, postnasal drip, rhinorrhea, sinus pressure, sinus pain, sneezing and sore throat.   Eyes: Negative.   Respiratory:  Negative for cough and wheezing.   Cardiovascular:  Negative for chest pain and palpitations.  Gastrointestinal:  Negative for constipation, diarrhea, nausea and vomiting.  Endocrine: Negative for cold intolerance, heat intolerance, polydipsia and polyuria.  Musculoskeletal:  Negative for back pain and myalgias.  Skin:  Negative for rash.  Allergic/Immunologic: Positive for environmental allergies.  Neurological:  Negative for dizziness, syncope, weakness and headaches.  Hematological: Negative.   Psychiatric/Behavioral:  Negative for dysphoric mood and sleep disturbance. The patient is not nervous/anxious.       Objective:    Physical Exam Vitals and nursing note reviewed.  Constitutional:      Appearance: Normal appearance. She is well-developed.  HENT:     Head: Normocephalic and atraumatic.     Nose: Nose normal.     Mouth/Throat:     Mouth: Mucous membranes are moist.  Eyes:     Extraocular Movements: Extraocular movements intact.     Conjunctiva/sclera: Conjunctivae normal.     Pupils: Pupils are equal, round, and reactive to light.  Cardiovascular:     Rate and Rhythm: Normal rate and regular rhythm.     Pulses: Normal pulses.     Heart sounds: Normal heart sounds.  Pulmonary:     Effort: Pulmonary effort is normal.     Breath sounds: Normal breath sounds.  Abdominal:     Palpations: Abdomen is soft.  Musculoskeletal:        General: Normal range of motion.     Cervical back: Normal range of motion and neck supple.  Lymphadenopathy:     Cervical: No cervical adenopathy.  Skin:    General: Skin is warm and dry.     Capillary Refill: Capillary refill takes less than 2 seconds.  Neurological:     General: No focal deficit present.     Mental Status: She is alert and oriented to person, place, and time.  Psychiatric:        Mood and Affect: Mood normal.        Behavior: Behavior normal.        Thought Content: Thought content normal.        Judgment: Judgment normal.    Today's Vitals   06/26/20 1441  BP: 103/64  Pulse: 79  Temp: (!) 96.1 F (35.6 C)  SpO2: 97%  Weight: 141 lb 12.8 oz (64.3 kg)  Height: 5' (1.524 m)   Body mass index is 27.69 kg/m.  Wt Readings from Last 3 Encounters:  06/26/20 141 lb 12.8 oz (64.3 kg)  05/16/20 145 lb 4.8 oz (65.9 kg)  04/20/20 150 lb 11.2 oz (68.4 kg)     Health Maintenance Due  Topic Date Due   Pneumococcal Vaccine 33-56 Years old (1 - PCV) Never done   Hepatitis C Screening  Never done   Zoster Vaccines- Shingrix (1 of 2) Never done   COLONOSCOPY (Pts 45-77yr Insurance coverage will need to be confirmed)  Never  done   COVID-19 Vaccine (3 - Pfizer risk series) 07/01/2019    There are no preventive care reminders to display for this patient.  Lab Results  Component Value Date   TSH 1.400 04/20/2020   Lab Results  Component Value Date   WBC 7.1 04/20/2020   HGB 15.5 04/20/2020   HCT 45.8 04/20/2020   MCV 93 04/20/2020   PLT 344 04/20/2020  Lab Results  Component Value Date   NA 137 04/20/2020   K 4.5 04/20/2020   CO2 22 04/20/2020   GLUCOSE 75 04/20/2020   BUN 16 04/20/2020   CREATININE 0.73 04/20/2020   BILITOT 0.4 04/20/2020   ALKPHOS 62 04/20/2020   AST 15 04/20/2020   ALT 9 04/20/2020   PROT 7.7 04/20/2020   ALBUMIN 4.5 04/20/2020   CALCIUM 9.6 04/20/2020   EGFR 101 04/20/2020   Lab Results  Component Value Date   CHOL 277 (H) 04/20/2020   Lab Results  Component Value Date   HDL 56 04/20/2020   Lab Results  Component Value Date   LDLCALC 185 (H) 04/20/2020   Lab Results  Component Value Date   TRIG 193 (H) 04/20/2020   Lab Results  Component Value Date   CHOLHDL 4.9 (H) 04/20/2020   Lab Results  Component Value Date   HGBA1C 5.2 09/21/2018      Assessment & Plan:  1. Primary hypertension Stable. Continue bp medication as prescribed   2. Mixed hyperlipidemia Continue crestor as prescribed   3. BMI 27.0-27.9,adult Continues to improve with a total of nine pounds lost since starting on this on 04/20/2020. May continue phentermine for additional 30 days. Continue to consume a 1200 calorie diet and with increased physical activity. Reassess in one month. - phentermine (ADIPEX-P) 37.5 MG tablet; Take 1 tablet (37.5 mg total) by mouth daily before breakfast.  Dispense: 30 tablet; Refill: 0   Problem List Items Addressed This Visit       Cardiovascular and Mediastinum   Primary hypertension - Primary     Other   BMI 27.0-27.9,adult   Relevant Medications   phentermine (ADIPEX-P) 37.5 MG tablet   Mixed hyperlipidemia    Meds ordered this encounter   Medications   phentermine (ADIPEX-P) 37.5 MG tablet    Sig: Take 1 tablet (37.5 mg total) by mouth daily before breakfast.    Dispense:  30 tablet    Refill:  0    Order Specific Question:   Supervising Provider    Answer:   Beatrice Lecher D [[id number]]    Follow-up: Return in about 4 weeks (around 07/24/2020) for weight management.    Ronnell Freshwater, NP

## 2020-06-26 NOTE — Patient Instructions (Signed)

## 2020-07-27 ENCOUNTER — Ambulatory Visit (INDEPENDENT_AMBULATORY_CARE_PROVIDER_SITE_OTHER): Payer: BC Managed Care – PPO | Admitting: Nurse Practitioner

## 2020-07-27 ENCOUNTER — Encounter: Payer: Self-pay | Admitting: Nurse Practitioner

## 2020-07-27 ENCOUNTER — Other Ambulatory Visit: Payer: Self-pay

## 2020-07-27 VITALS — BP 109/67 | HR 75 | Temp 98.0°F | Ht 60.0 in | Wt 136.4 lb

## 2020-07-27 DIAGNOSIS — E782 Mixed hyperlipidemia: Secondary | ICD-10-CM | POA: Diagnosis not present

## 2020-07-27 DIAGNOSIS — I10 Essential (primary) hypertension: Secondary | ICD-10-CM | POA: Diagnosis not present

## 2020-07-27 DIAGNOSIS — Z6826 Body mass index (BMI) 26.0-26.9, adult: Secondary | ICD-10-CM | POA: Diagnosis not present

## 2020-07-27 MED ORDER — PHENTERMINE HCL 37.5 MG PO TABS: 37.5000 mg | ORAL_TABLET | Freq: Every day | ORAL | 1 refills | Status: DC

## 2020-07-27 NOTE — Patient Instructions (Signed)

## 2020-07-27 NOTE — Progress Notes (Signed)
Established Patient Office Visit  Subjective:  Patient ID: Monica Reed, female    DOB: [birth year]  Age: 50 y.o. MRN: [id number]  CC:  Chief Complaint  Patient presents with   Weight Check     HPI Monica Reed presents for evaluation of weight management.  She is currently taking phentermine 37.5 mg tablets.  She has lost 5 pounds since her most recent visit.  She was started on this medicine on 04/20/2020.  She has lost a total of 14 pounds.  She has had no negative side effects from taking phentermine.  Her blood pressure is well managed.  She has no new concerns or complaints today. She denies chest pain, chest pressure, or shortness of breath. She denies headaches or visual disturbances. She denies abdominal pain, nausea, vomiting, or changes in bowel or bladder habits.    Past Medical History:  Diagnosis Date   Frequent UTI    Hypertension     History reviewed. No pertinent surgical history.  Family History  Problem Relation Age of Onset   Arthritis Mother    Cancer Father        prostate   Heart attack Father    Healthy Sister    Healthy Brother    Hypertension Daughter    Healthy Brother    Healthy Daughter    Healthy Daughter     Social History   Socioeconomic History   Marital status: Married    Spouse name: Not on file   Number of children: Not on file   Years of education: Not on file   Highest education level: Not on file  Occupational History   Not on file  Tobacco Use   Smoking status: Never   Smokeless tobacco: Never  Vaping Use   Vaping Use: Never used  Substance and Sexual Activity   Alcohol use: No   Drug use: No   Sexual activity: Yes    Partners: Male    Birth control/protection: Pill  Other Topics Concern   Not on file  Social History Narrative   Not on file   Social Determinants of Health   Financial Resource Strain: Not on file  Food Insecurity: Not on file  Transportation Needs: Not on file  Physical  Activity: Not on file  Stress: Not on file  Social Connections: Not on file  Intimate Partner Violence: Not on file    Outpatient Medications Prior to Visit  Medication Sig Dispense Refill   FLUoxetine (PROZAC) 40 MG capsule Take 1 capsule (40 mg total) by mouth daily. 90 capsule 1   lisinopril-hydrochlorothiazide (ZESTORETIC) 20-12.5 MG tablet Take 1 tablet by mouth daily. 90 tablet 1   loratadine (CLARITIN) 10 MG tablet Take 10 mg by mouth daily.     norethindrone-ethinyl estradiol (LOESTRIN FE) 1-20 MG-MCG tablet Take 1 tablet by mouth daily.     rosuvastatin (CRESTOR) 5 MG tablet Take 1 tablet (5 mg total) by mouth daily. 90 tablet 1   phentermine (ADIPEX-P) 37.5 MG tablet Take 1 tablet (37.5 mg total) by mouth daily before breakfast. 30 tablet 0   No facility-administered medications prior to visit.    Allergies  Allergen Reactions   Cyclobenzaprine Swelling    ROS Review of Systems  Constitutional:  Negative for activity change, chills, fatigue and fever.       Patient has had 5 pound weight loss since last visit.  HENT: Negative.  Negative for congestion and sinus pain.   Eyes: Negative.  Respiratory:  Negative for cough, chest tightness, shortness of breath and wheezing.   Cardiovascular:  Negative for chest pain and palpitations.  Gastrointestinal:  Negative for constipation, diarrhea, nausea and vomiting.  Endocrine: Negative for cold intolerance, heat intolerance, polydipsia and polyuria.  Genitourinary: Negative.   Musculoskeletal:  Negative for back pain and myalgias.  Skin:  Negative for rash.  Allergic/Immunologic: Negative.   Neurological:  Negative for dizziness, weakness and headaches.  Hematological:  Negative for adenopathy.  Psychiatric/Behavioral:  Negative for dysphoric mood. The patient is not nervous/anxious.      Objective:    Physical Exam Vitals and nursing note reviewed.  Constitutional:      Appearance: Normal appearance. She is  well-developed.  HENT:     Head: Normocephalic and atraumatic.     Nose: Nose normal.     Mouth/Throat:     Mouth: Mucous membranes are moist.  Eyes:     Extraocular Movements: Extraocular movements intact.     Conjunctiva/sclera: Conjunctivae normal.     Pupils: Pupils are equal, round, and reactive to light.  Cardiovascular:     Rate and Rhythm: Normal rate and regular rhythm.     Pulses: Normal pulses.     Heart sounds: Normal heart sounds.  Pulmonary:     Effort: Pulmonary effort is normal.     Breath sounds: Normal breath sounds.  Abdominal:     Palpations: Abdomen is soft.  Musculoskeletal:        General: Normal range of motion.     Cervical back: Normal range of motion and neck supple.  Lymphadenopathy:     Cervical: No cervical adenopathy.  Skin:    General: Skin is warm and dry.     Capillary Refill: Capillary refill takes less than 2 seconds.  Neurological:     General: No focal deficit present.     Mental Status: She is alert and oriented to person, place, and time.  Psychiatric:        Mood and Affect: Mood normal.        Behavior: Behavior normal.        Thought Content: Thought content normal.        Judgment: Judgment normal.    Today's Vitals   07/27/20 1013  BP: 109/67  Pulse: 75  Temp: 98 F (36.7 C)  SpO2: 97%  Weight: 136 lb 6.4 oz (61.9 kg)  Height: 5' (1.524 m)   Body mass index is 26.64 kg/m.   Wt Readings from Last 3 Encounters:  07/27/20 136 lb 6.4 oz (61.9 kg)  06/26/20 141 lb 12.8 oz (64.3 kg)  05/16/20 145 lb 4.8 oz (65.9 kg)     Health Maintenance Due  Topic Date Due   Pneumococcal Vaccine 8-59 Years old (1 - PCV) Never done   Hepatitis C Screening  Never done   Zoster Vaccines- Shingrix (1 of 2) Never done   COLONOSCOPY (Pts 45-39yr Insurance coverage will need to be confirmed)  Never done   COVID-19 Vaccine (3 - Pfizer risk series) 07/01/2019    There are no preventive care reminders to display for this patient.  Lab  Results  Component Value Date   TSH 1.400 04/20/2020   Lab Results  Component Value Date   WBC 7.1 04/20/2020   HGB 15.5 04/20/2020   HCT 45.8 04/20/2020   MCV 93 04/20/2020   PLT 344 04/20/2020   Lab Results  Component Value Date   NA 137 04/20/2020   K 4.5 04/20/2020  CO2 22 04/20/2020   GLUCOSE 75 04/20/2020   BUN 16 04/20/2020   CREATININE 0.73 04/20/2020   BILITOT 0.4 04/20/2020   ALKPHOS 62 04/20/2020   AST 15 04/20/2020   ALT 9 04/20/2020   PROT 7.7 04/20/2020   ALBUMIN 4.5 04/20/2020   CALCIUM 9.6 04/20/2020   EGFR 101 04/20/2020   Lab Results  Component Value Date   CHOL 277 (H) 04/20/2020   Lab Results  Component Value Date   HDL 56 04/20/2020   Lab Results  Component Value Date   LDLCALC 185 (H) 04/20/2020   Lab Results  Component Value Date   TRIG 193 (H) 04/20/2020   Lab Results  Component Value Date   CHOLHDL 4.9 (H) 04/20/2020   Lab Results  Component Value Date   HGBA1C 5.2 09/21/2018      Assessment & Plan:  1. Primary hypertension Patient's blood pressure is well managed.  Continue medication as prescribed.  2. Mixed hyperlipidemia Continue Crestor as prescribed.  3. BMI 26.0-26.9,adult Will begin weaning process on phentermine.  Recommend she start holding dose of phentermine every third day for 2 weeks.  Then hold phentermine every other day.  Gradually reduce this to she is completely off medication.  Continue to follow a 1200 to 1500-calorie diet.  Continue with routine daily exercise. - phentermine (ADIPEX-P) 37.5 MG tablet; Take 1 tablet (37.5 mg total) by mouth daily before breakfast.  Dispense: 30 tablet; Refill: 1   Problem List Items Addressed This Visit       Cardiovascular and Mediastinum   Primary hypertension - Primary     Other   Body mass index 26.0-26.9, adult   Relevant Medications   phentermine (ADIPEX-P) 37.5 MG tablet   Mixed hyperlipidemia    Meds ordered this encounter  Medications   phentermine  (ADIPEX-P) 37.5 MG tablet    Sig: Take 1 tablet (37.5 mg total) by mouth daily before breakfast.    Dispense:  30 tablet    Refill:  1    Order Specific Question:   Supervising Provider    Answer:   Beatrice Lecher D [[id number]]     Follow-up: Return in 2 months (on 09/27/2020) for routine - weight management.    Ronnell Freshwater, NP

## 2020-08-23 ENCOUNTER — Other Ambulatory Visit: Payer: Self-pay | Admitting: Obstetrics and Gynecology

## 2020-08-23 DIAGNOSIS — R928 Other abnormal and inconclusive findings on diagnostic imaging of breast: Secondary | ICD-10-CM

## 2020-08-23 DIAGNOSIS — N63 Unspecified lump in unspecified breast: Secondary | ICD-10-CM

## 2020-08-26 ENCOUNTER — Other Ambulatory Visit: Payer: Self-pay | Admitting: Nurse Practitioner

## 2020-08-26 DIAGNOSIS — F330 Major depressive disorder, recurrent, mild: Secondary | ICD-10-CM

## 2020-10-02 ENCOUNTER — Ambulatory Visit: Payer: BC Managed Care – PPO | Admitting: Nurse Practitioner

## 2020-10-10 ENCOUNTER — Encounter: Payer: Self-pay | Admitting: Nurse Practitioner

## 2020-10-10 ENCOUNTER — Other Ambulatory Visit: Payer: Self-pay

## 2020-10-10 ENCOUNTER — Ambulatory Visit: Payer: BC Managed Care – PPO | Admitting: Nurse Practitioner

## 2020-10-10 VITALS — BP 124/74 | HR 69 | Temp 98.3°F | Ht 60.0 in | Wt 137.5 lb

## 2020-10-10 DIAGNOSIS — M5136 Other intervertebral disc degeneration, lumbar region: Secondary | ICD-10-CM

## 2020-10-10 DIAGNOSIS — Z6826 Body mass index (BMI) 26.0-26.9, adult: Secondary | ICD-10-CM | POA: Diagnosis not present

## 2020-10-10 DIAGNOSIS — F330 Major depressive disorder, recurrent, mild: Secondary | ICD-10-CM | POA: Diagnosis not present

## 2020-10-10 DIAGNOSIS — I10 Essential (primary) hypertension: Secondary | ICD-10-CM | POA: Diagnosis not present

## 2020-10-10 MED ORDER — FLUOXETINE HCL 20 MG PO CAPS: ORAL_CAPSULE | ORAL | 3 refills | Status: DC

## 2020-10-10 MED ORDER — METHYLPREDNISOLONE 4 MG PO TBPK: ORAL_TABLET | ORAL | 0 refills | Status: DC

## 2020-10-10 MED ORDER — PHENTERMINE HCL 37.5 MG PO TABS: 37.5000 mg | ORAL_TABLET | Freq: Every day | ORAL | 1 refills | Status: DC

## 2020-10-10 MED ORDER — GABAPENTIN 100 MG PO CAPS: ORAL_CAPSULE | ORAL | 1 refills | Status: DC

## 2020-10-10 NOTE — Progress Notes (Signed)
Established Patient Office Visit  Subjective:  Patient ID: Monica Reed, female    DOB: 1970-07-09  Age: 50 y.o. MRN: [id number]  CC:  Chief Complaint  Patient presents with   Weight Check    HPI Monica Reed presents for follow up visit. Today, she is having right groin pain which radiates intot the right upper leg to just above the knee. No injury or different exercise. This is bothering her mostly at night. Actually so bad it is keeping her awake at night. Describes the feeling as pins and needles. At night, the pain is about 8/10 in severity. During the day, she has little to no pain.  She states that she is also having problems with her youngest daughter. Her daughter is in an abusive relationship. She is 25 years old. Daughter has a two year old daughter. Patient is very upset by the situation. Makes her want to cry. Makes it difficult for her to eat.  Patient is also here for weight management.  She states she has been trying to limit her calorie intake and exercise every day.  Has been trying to manage without weight loss medication.  Was on phentermine every day.  Did very well.  Gained only 1 pound since she was last seen.  She reports no negative side effects from taking phentermine.  Past Medical History:  Diagnosis Date   Frequent UTI    Hypertension     History reviewed. No pertinent surgical history.  Family History  Problem Relation Age of Onset   Arthritis Mother    Cancer Father        prostate   Heart attack Father    Healthy Sister    Healthy Brother    Hypertension Daughter    Healthy Brother    Healthy Daughter    Healthy Daughter     Social History   Socioeconomic History   Marital status: Married    Spouse name: Not on file   Number of children: Not on file   Years of education: Not on file   Highest education level: Not on file  Occupational History   Not on file  Tobacco Use   Smoking status: Never   Smokeless tobacco:  Never  Vaping Use   Vaping Use: Never used  Substance and Sexual Activity   Alcohol use: No   Drug use: No   Sexual activity: Yes    Partners: Male    Birth control/protection: Pill  Other Topics Concern   Not on file  Social History Narrative   Not on file   Social Determinants of Health   Financial Resource Strain: Not on file  Food Insecurity: Not on file  Transportation Needs: Not on file  Physical Activity: Not on file  Stress: Not on file  Social Connections: Not on file  Intimate Partner Violence: Not on file    Outpatient Medications Prior to Visit  Medication Sig Dispense Refill   lisinopril-hydrochlorothiazide (ZESTORETIC) 20-12.5 MG tablet Take 1 tablet by mouth daily. 90 tablet 1   loratadine (CLARITIN) 10 MG tablet Take 10 mg by mouth daily.     norethindrone-ethinyl estradiol (LOESTRIN FE) 1-20 MG-MCG tablet Take 1 tablet by mouth daily.     rosuvastatin (CRESTOR) 5 MG tablet Take 1 tablet (5 mg total) by mouth daily. 90 tablet 1   FLUoxetine (PROZAC) 40 MG capsule TAKE 1 CAPSULE (40 MG TOTAL) BY MOUTH DAILY. 90 capsule 0   phentermine (ADIPEX-P) 37.5 MG tablet  Take 1 tablet (37.5 mg total) by mouth daily before breakfast. 30 tablet 1   No facility-administered medications prior to visit.    Allergies  Allergen Reactions   Cyclobenzaprine Swelling    ROS Review of Systems  Constitutional:  Negative for activity change, appetite change, chills, fatigue and fever.       Patient has had a 1 pound weight gain since her last visit.  HENT:  Negative for congestion, postnasal drip, rhinorrhea, sinus pressure, sinus pain, sneezing and sore throat.   Eyes: Negative.   Respiratory:  Negative for cough, chest tightness, shortness of breath and wheezing.   Cardiovascular:  Negative for chest pain and palpitations.  Gastrointestinal:  Negative for abdominal pain, constipation, diarrhea, nausea and vomiting.  Endocrine: Negative for cold intolerance, heat  intolerance, polydipsia and polyuria.  Genitourinary:  Negative for dyspareunia, dysuria, flank pain, frequency and urgency.  Musculoskeletal:  Positive for arthralgias and myalgias. Negative for back pain.       Patient reports no pain in right groin..  Radiates to the right upper leg to just above the right knee.  She denies injury or trauma to the area.  States that range of motion and strength are intact.  Pain is really only bothersome at night.  Skin:  Negative for rash.  Allergic/Immunologic: Negative for environmental allergies.  Neurological:  Negative for dizziness, weakness and headaches.  Hematological:  Negative for adenopathy.  Psychiatric/Behavioral:  Positive for dysphoric mood. The patient is nervous/anxious.      Objective:    Physical Exam Vitals and nursing note reviewed.  Constitutional:      Appearance: Normal appearance. She is well-developed.  HENT:     Head: Normocephalic and atraumatic.     Nose: Nose normal.     Mouth/Throat:     Mouth: Mucous membranes are moist.  Eyes:     Extraocular Movements: Extraocular movements intact.     Conjunctiva/sclera: Conjunctivae normal.     Pupils: Pupils are equal, round, and reactive to light.  Cardiovascular:     Rate and Rhythm: Normal rate and regular rhythm.     Pulses: Normal pulses.     Heart sounds: Normal heart sounds.  Pulmonary:     Effort: Pulmonary effort is normal.     Breath sounds: Normal breath sounds.  Abdominal:     Palpations: Abdomen is soft.  Musculoskeletal:        General: Normal range of motion.     Cervical back: Normal range of motion and neck supple.  Lymphadenopathy:     Cervical: No cervical adenopathy.  Skin:    General: Skin is warm and dry.     Capillary Refill: Capillary refill takes less than 2 seconds.  Neurological:     General: No focal deficit present.     Mental Status: She is alert and oriented to person, place, and time.  Psychiatric:        Mood and Affect: Mood  normal.        Behavior: Behavior normal.        Thought Content: Thought content normal.        Judgment: Judgment normal.    Today's Vitals   10/10/20 1432  BP: 124/74  Pulse: 69  Temp: 98.3 F (36.8 C)  SpO2: 98%  Weight: 137 lb 8 oz (62.4 kg)  Height: 5' (1.524 m)   Body mass index is 26.85 kg/m.   Wt Readings from Last 3 Encounters:  10/10/20 137 lb 8 oz (  62.4 kg)  07/27/20 136 lb 6.4 oz (61.9 kg)  06/26/20 141 lb 12.8 oz (64.3 kg)     Health Maintenance Due  Topic Date Due   Hepatitis C Screening  Never done   Zoster Vaccines- Shingrix (1 of 2) Never done   COLONOSCOPY (Pts 45-17yr Insurance coverage will need to be confirmed)  Never done   COVID-19 Vaccine (3 - Pfizer risk series) 07/01/2019   INFLUENZA VACCINE  Never done    There are no preventive care reminders to display for this patient.  Lab Results  Component Value Date   TSH 1.400 04/20/2020   Lab Results  Component Value Date   WBC 7.1 04/20/2020   HGB 15.5 04/20/2020   HCT 45.8 04/20/2020   MCV 93 04/20/2020   PLT 344 04/20/2020   Lab Results  Component Value Date   NA 137 04/20/2020   K 4.5 04/20/2020   CO2 22 04/20/2020   GLUCOSE 75 04/20/2020   BUN 16 04/20/2020   CREATININE 0.73 04/20/2020   BILITOT 0.4 04/20/2020   ALKPHOS 62 04/20/2020   AST 15 04/20/2020   ALT 9 04/20/2020   PROT 7.7 04/20/2020   ALBUMIN 4.5 04/20/2020   CALCIUM 9.6 04/20/2020   EGFR 101 04/20/2020   Lab Results  Component Value Date   CHOL 277 (H) 04/20/2020   Lab Results  Component Value Date   HDL 56 04/20/2020   Lab Results  Component Value Date   LDLCALC 185 (H) 04/20/2020   Lab Results  Component Value Date   TRIG 193 (H) 04/20/2020   Lab Results  Component Value Date   CHOLHDL 4.9 (H) 04/20/2020   Lab Results  Component Value Date   HGBA1C 5.2 09/21/2018      Assessment & Plan:  1. Primary hypertension Blood pressure stable.  Continue medication as prescribed.  2. Mild  recurrent major depression ([place] Due to current increase in family stress depression/anxiety has increased.  Change fluoxetine to 60 mg daily.  We will reassess at next visit in 4 to 5 weeks. - FLUoxetine (PROZAC) 20 MG capsule; Take 3 capsules po QD  Dispense: 90 capsule; Refill: 3  3. DDD (degenerative disc disease), lumbar Suspect right groin pain due to L4 nerve root impingement.  Treat with Medrol taper.  Take as directed for 6 days.  We will add gabapentin 100 mg capsules at night.  May increase to twice daily dosing as needed and as indicated.  Advised patient to contact the office in 3 to 4 days if there is no change in pain level.  We will consider getting x-ray of right hip and lumbar spine.  She voiced understanding and agreement. - methylPREDNISolone (MEDROL) 4 MG TBPK tablet; Take by mouth as directed for 6 days  Dispense: 21 tablet; Refill: 0 - gabapentin (NEURONTIN) 100 MG capsule; Take 1 capsule po QpM prn. May increase to IBID as needed and as tolerated  Dispense: 60 capsule; Refill: 1  4. BMI 26.0-26.9,adult Restart phentermine 37.5 mg tablets daily.  Recommend she maintain a 1500-calorie diet.  She should consume a low-fat, low-cholesterol diet and continue to exercise daily.  Advise she hold phentermine until she completes treatment with Medrol taper.  She voiced understanding and agreement. - phentermine (ADIPEX-P) 37.5 MG tablet; Take 1 tablet (37.5 mg total) by mouth daily before breakfast.  Dispense: 30 tablet; Refill: 1  Problem List Items Addressed This Visit       Cardiovascular and Mediastinum   Primary hypertension -  Primary     Musculoskeletal and Integument   DDD (degenerative disc disease), lumbar   Relevant Medications   methylPREDNISolone (MEDROL) 4 MG TBPK tablet   gabapentin (NEURONTIN) 100 MG capsule     Other   BMI 26.0-26.9,adult   Relevant Medications   phentermine (ADIPEX-P) 37.5 MG tablet   Mild recurrent major depression (HCC)   Relevant  Medications   FLUoxetine (PROZAC) 20 MG capsule    Meds ordered this encounter  Medications   FLUoxetine (PROZAC) 20 MG capsule    Sig: Take 3 capsules po QD    Dispense:  90 capsule    Refill:  3    Please note change in dosing    Order Specific Question:   Supervising Provider    Answer:   Beatrice Lecher D [[id number]]   methylPREDNISolone (MEDROL) 4 MG TBPK tablet    Sig: Take by mouth as directed for 6 days    Dispense:  21 tablet    Refill:  0    Order Specific Question:   Supervising Provider    Answer:   Beatrice Lecher D [[id number]]   gabapentin (NEURONTIN) 100 MG capsule    Sig: Take 1 capsule po QpM prn. May increase to IBID as needed and as tolerated    Dispense:  60 capsule    Refill:  1    Order Specific Question:   Supervising Provider    Answer:   Beatrice Lecher D [[id number]]   phentermine (ADIPEX-P) 37.5 MG tablet    Sig: Take 1 tablet (37.5 mg total) by mouth daily before breakfast.    Dispense:  30 tablet    Refill:  1    Order Specific Question:   Supervising Provider    Answer:   Beatrice Lecher D [[id number]]   This note was dictated using Dragon Voice Recognition Software. Rapid proofreading was performed to expedite the delivery of the information. Despite proofreading, phonetic errors will occur which are common with this voice recognition software. Please take this into consideration. If there are any concerns, please contact our office.     Follow-up: Return in about 5 weeks (around 11/14/2020) for routine - weight management, increased dose or prozac .    Ronnell Freshwater, NP

## 2020-10-24 ENCOUNTER — Telehealth (HOSPITAL_BASED_OUTPATIENT_CLINIC_OR_DEPARTMENT_OTHER): Payer: Self-pay | Admitting: Nurse Practitioner

## 2020-10-24 NOTE — Telephone Encounter (Signed)
Called pt she is aware of x-ray that will be sent for her hip and lumbar spine

## 2020-10-24 NOTE — Telephone Encounter (Signed)
Patient called into office to let Herbert Seta know the medication is not helping with the pain in her right leg.  From her previous visit, she was told to let Herbert Seta know if it helped and if not she would order an x-ray of right hip and lumbar spine.  Patient would like this ordered.

## 2020-10-25 ENCOUNTER — Other Ambulatory Visit: Payer: Self-pay

## 2020-10-25 ENCOUNTER — Other Ambulatory Visit: Payer: BC Managed Care – PPO

## 2020-10-25 DIAGNOSIS — R1031 Right lower quadrant pain: Secondary | ICD-10-CM

## 2020-10-25 DIAGNOSIS — M5136 Other intervertebral disc degeneration, lumbar region: Secondary | ICD-10-CM

## 2020-10-25 DIAGNOSIS — M51369 Other intervertebral disc degeneration, lumbar region without mention of lumbar back pain or lower extremity pain: Secondary | ICD-10-CM

## 2020-10-25 NOTE — Progress Notes (Signed)
error 

## 2020-10-26 ENCOUNTER — Other Ambulatory Visit: Payer: Self-pay

## 2020-10-26 ENCOUNTER — Ambulatory Visit
Admission: RE | Admit: 2020-10-26 | Discharge: 2020-10-26 | Disposition: A | Discharge status: Home / Self Care | Payer: BC Managed Care – PPO | Source: Ambulatory Visit | Attending: Nurse Practitioner | Admitting: Nurse Practitioner

## 2020-10-26 DIAGNOSIS — M25551 Pain in right hip: Secondary | ICD-10-CM | POA: Diagnosis not present

## 2020-10-26 DIAGNOSIS — M5136 Other intervertebral disc degeneration, lumbar region: Secondary | ICD-10-CM

## 2020-10-26 DIAGNOSIS — M545 Low back pain, unspecified: Secondary | ICD-10-CM | POA: Diagnosis not present

## 2020-10-26 DIAGNOSIS — M51369 Other intervertebral disc degeneration, lumbar region without mention of lumbar back pain or lower extremity pain: Secondary | ICD-10-CM

## 2020-10-26 DIAGNOSIS — M25561 Pain in right knee: Secondary | ICD-10-CM | POA: Diagnosis not present

## 2020-10-26 DIAGNOSIS — R1031 Right lower quadrant pain: Secondary | ICD-10-CM

## 2020-10-26 IMAGING — CR DG LUMBAR SPINE COMPLETE 4+V
5 series · 5 of 5 positions shown · non-contrast
Comparison: Concurrently obtained radiographs of the right hip

CLINICAL DATA: Right groin pain radiating to the right knee for the
past 3 weeks

EXAM:
LUMBAR SPINE - COMPLETE 4+ VIEW

[t lumbar spine ap]
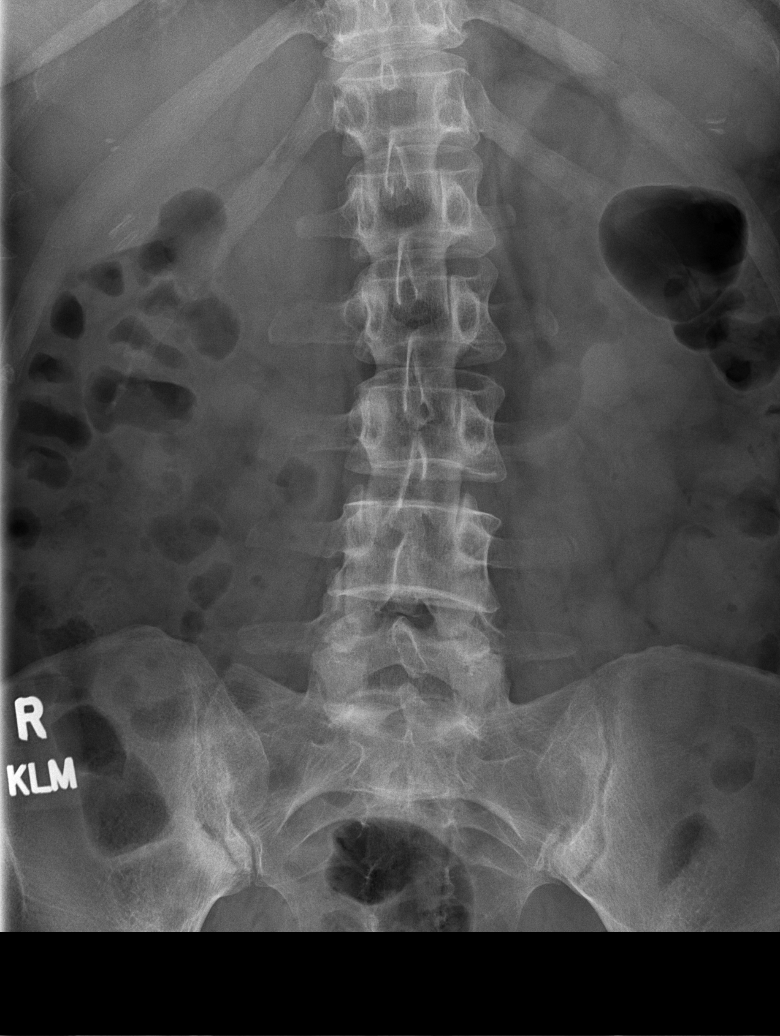

[t lumbar spine obl (1 of 2)]
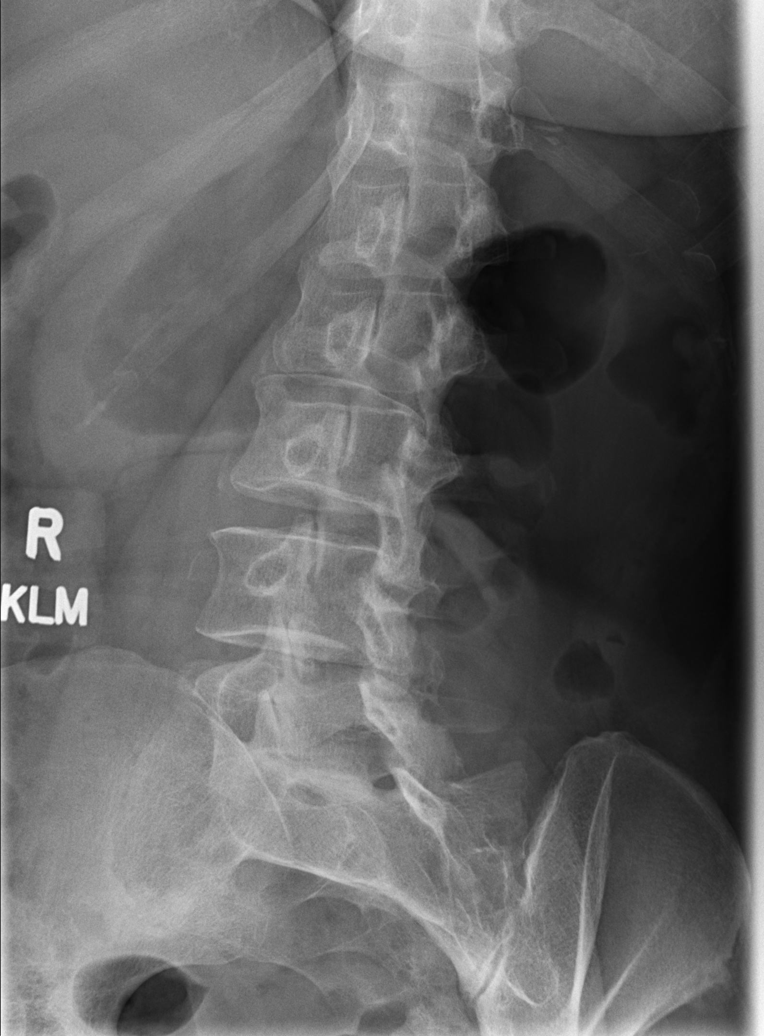

[t lumbar spine obl (2 of 2)]
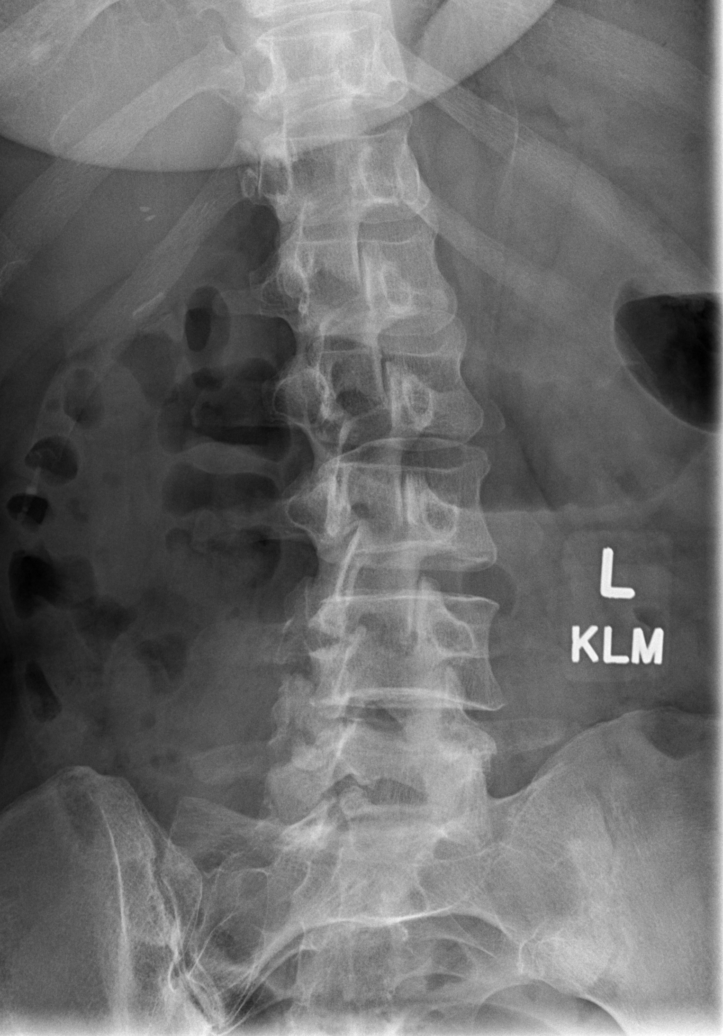

[t lumbar spine lat]
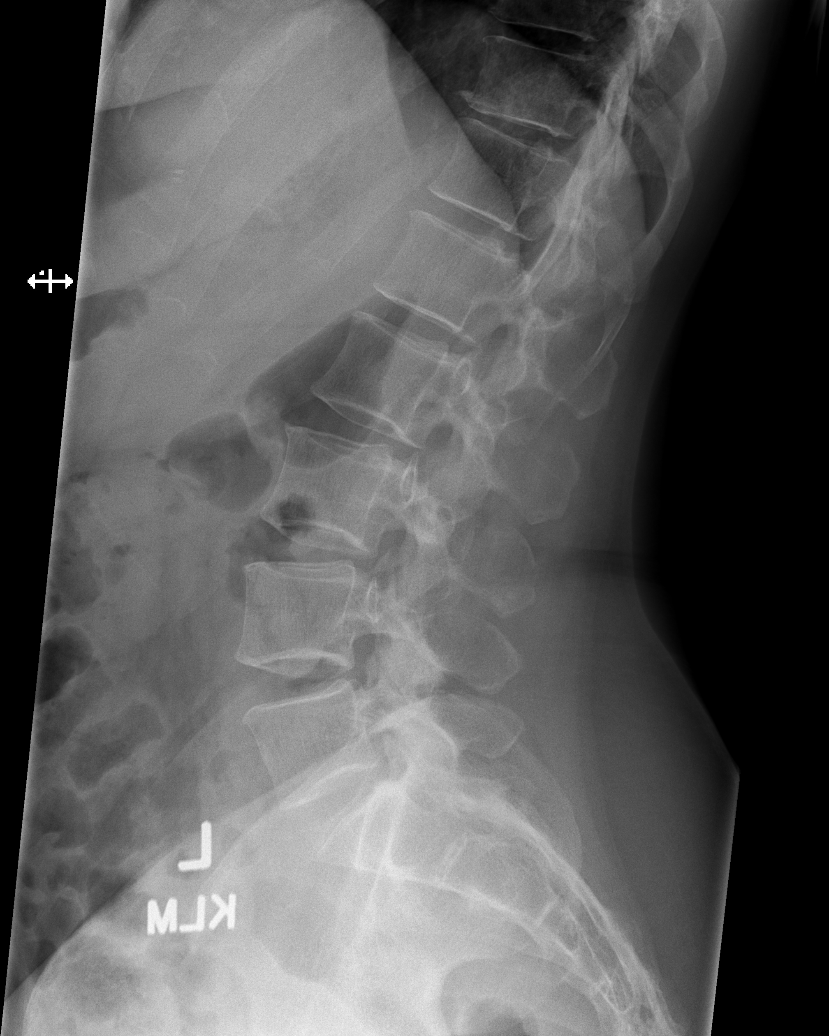

[t lumbar l-5 s-1 spot]
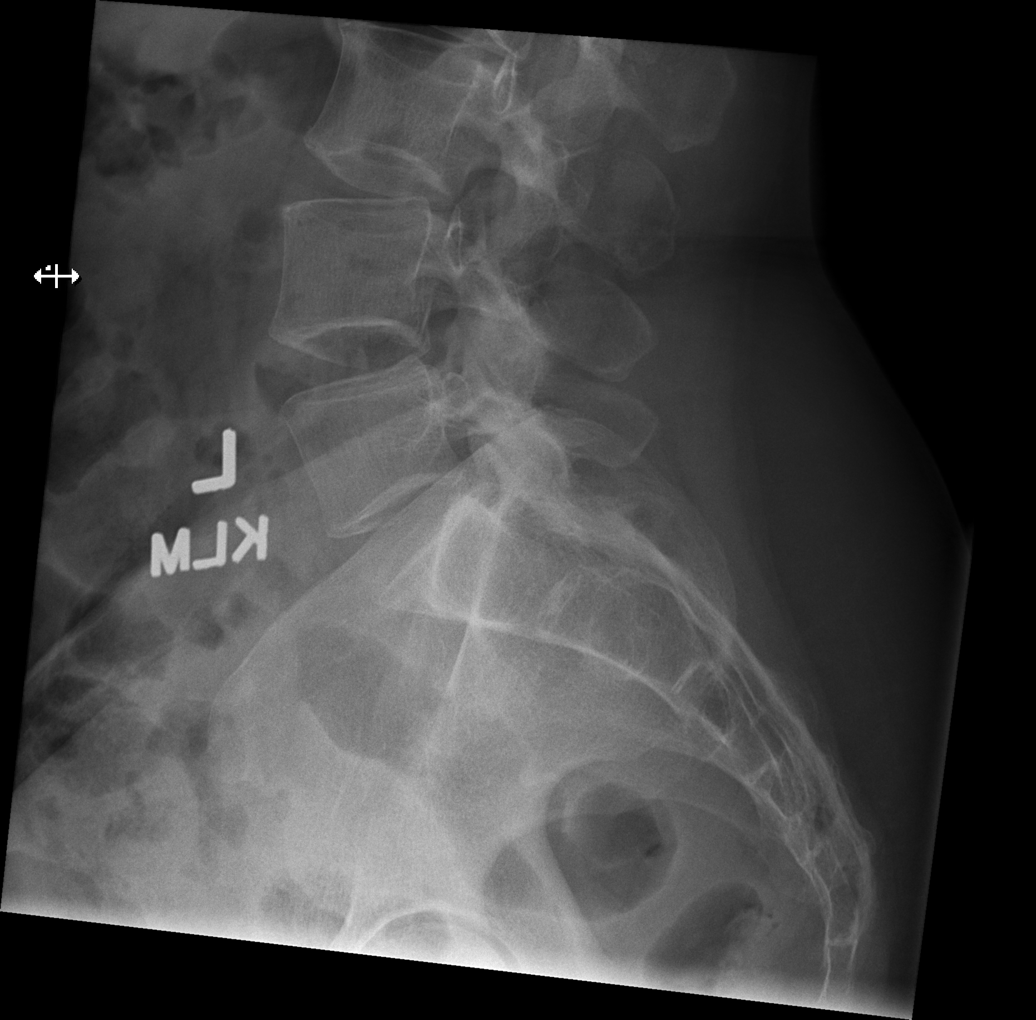

[5 of 5 positions shown; findings below may reference images not displayed]

FINDINGS: There is no evidence of lumbar spine fracture. Alignment is normal.
Intervertebral disc spaces are maintained.
IMPRESSION: Negative.

## 2020-10-26 IMAGING — CR DG HIP (WITH OR WITHOUT PELVIS) 2-3V*R*
2 series · 2 of 2 positions shown · non-contrast
Comparison: Prior MRI of the pelvis [DATE]

CLINICAL DATA: Right groin pain radiating to the right knee for the
past 3 weeks

EXAM:
DG HIP (WITH OR WITHOUT PELVIS) 2-3V RIGHT

[w hip ap right]
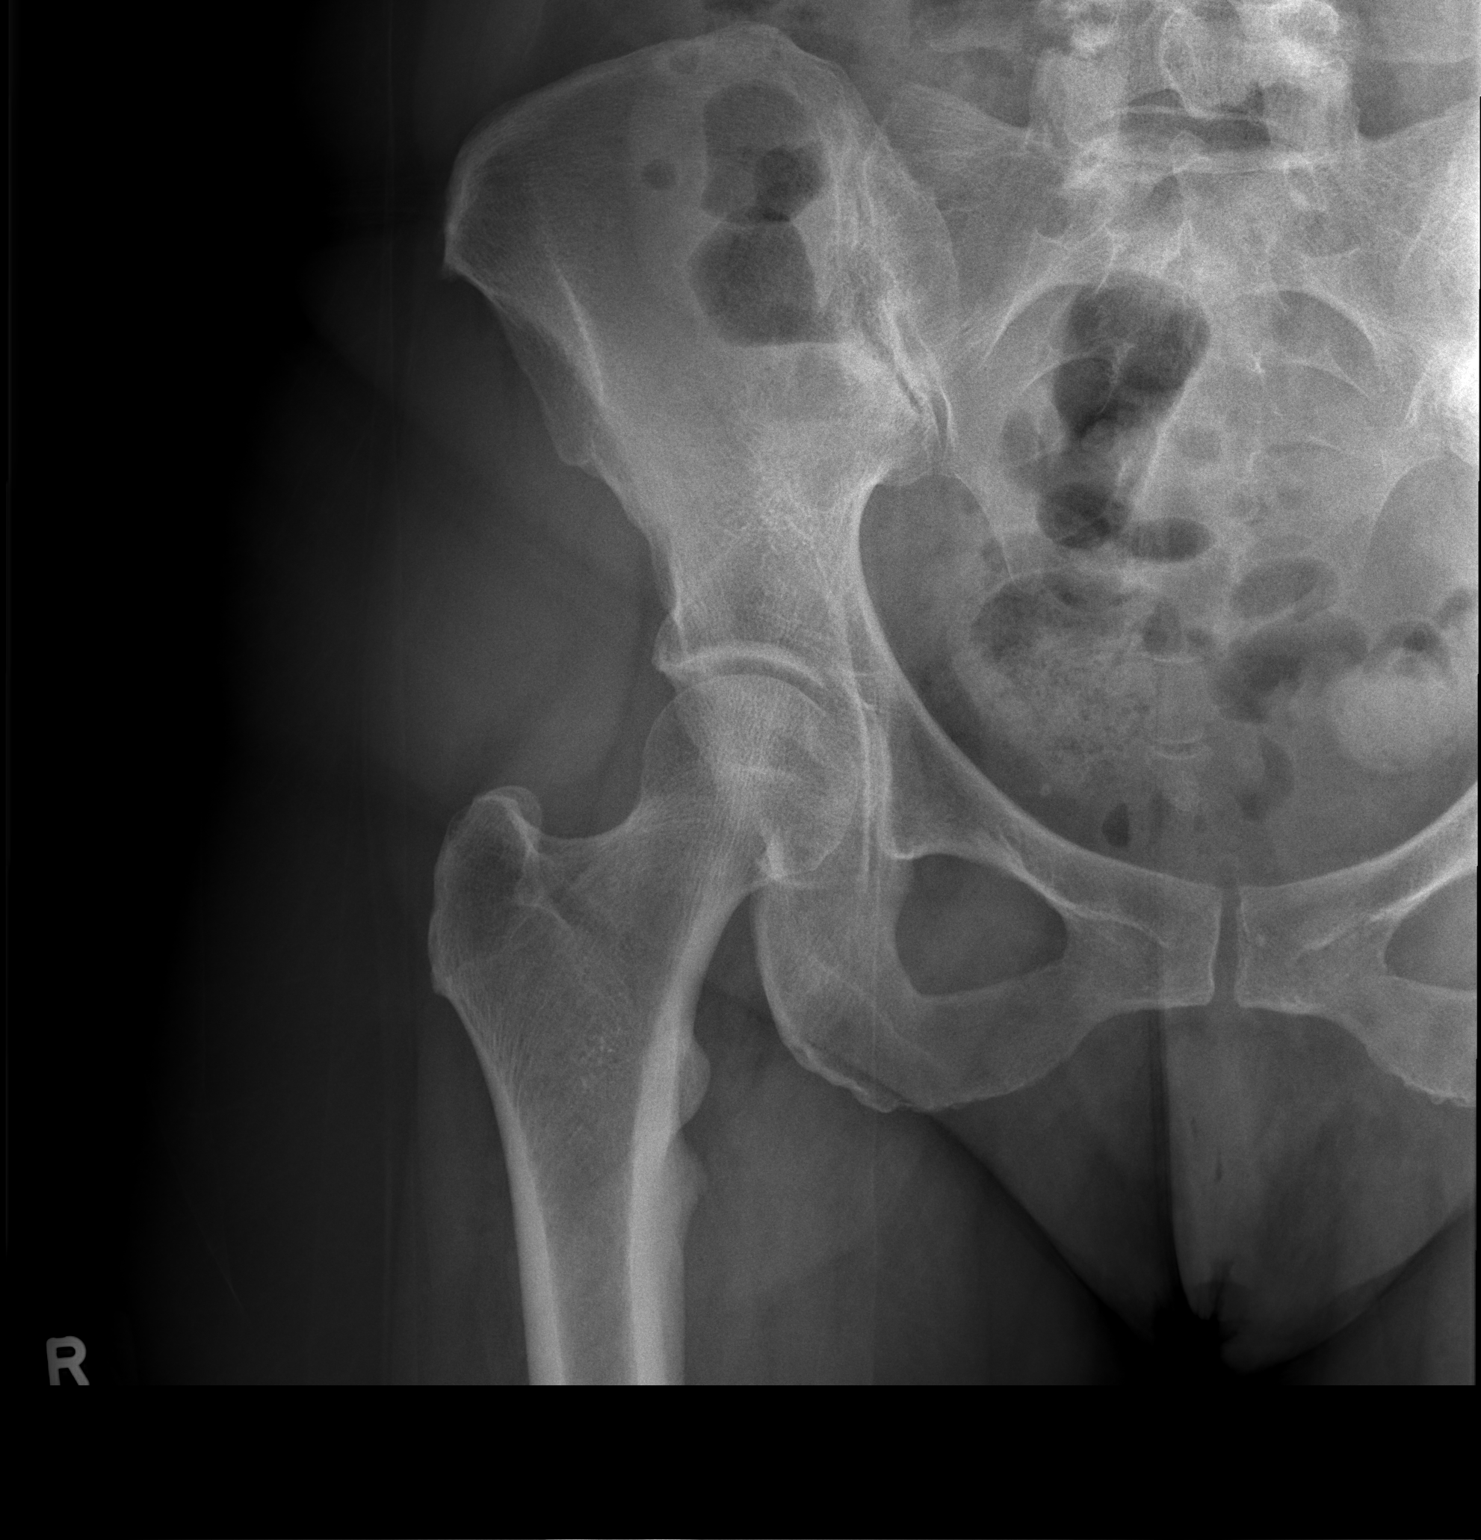

[w hip frog right]
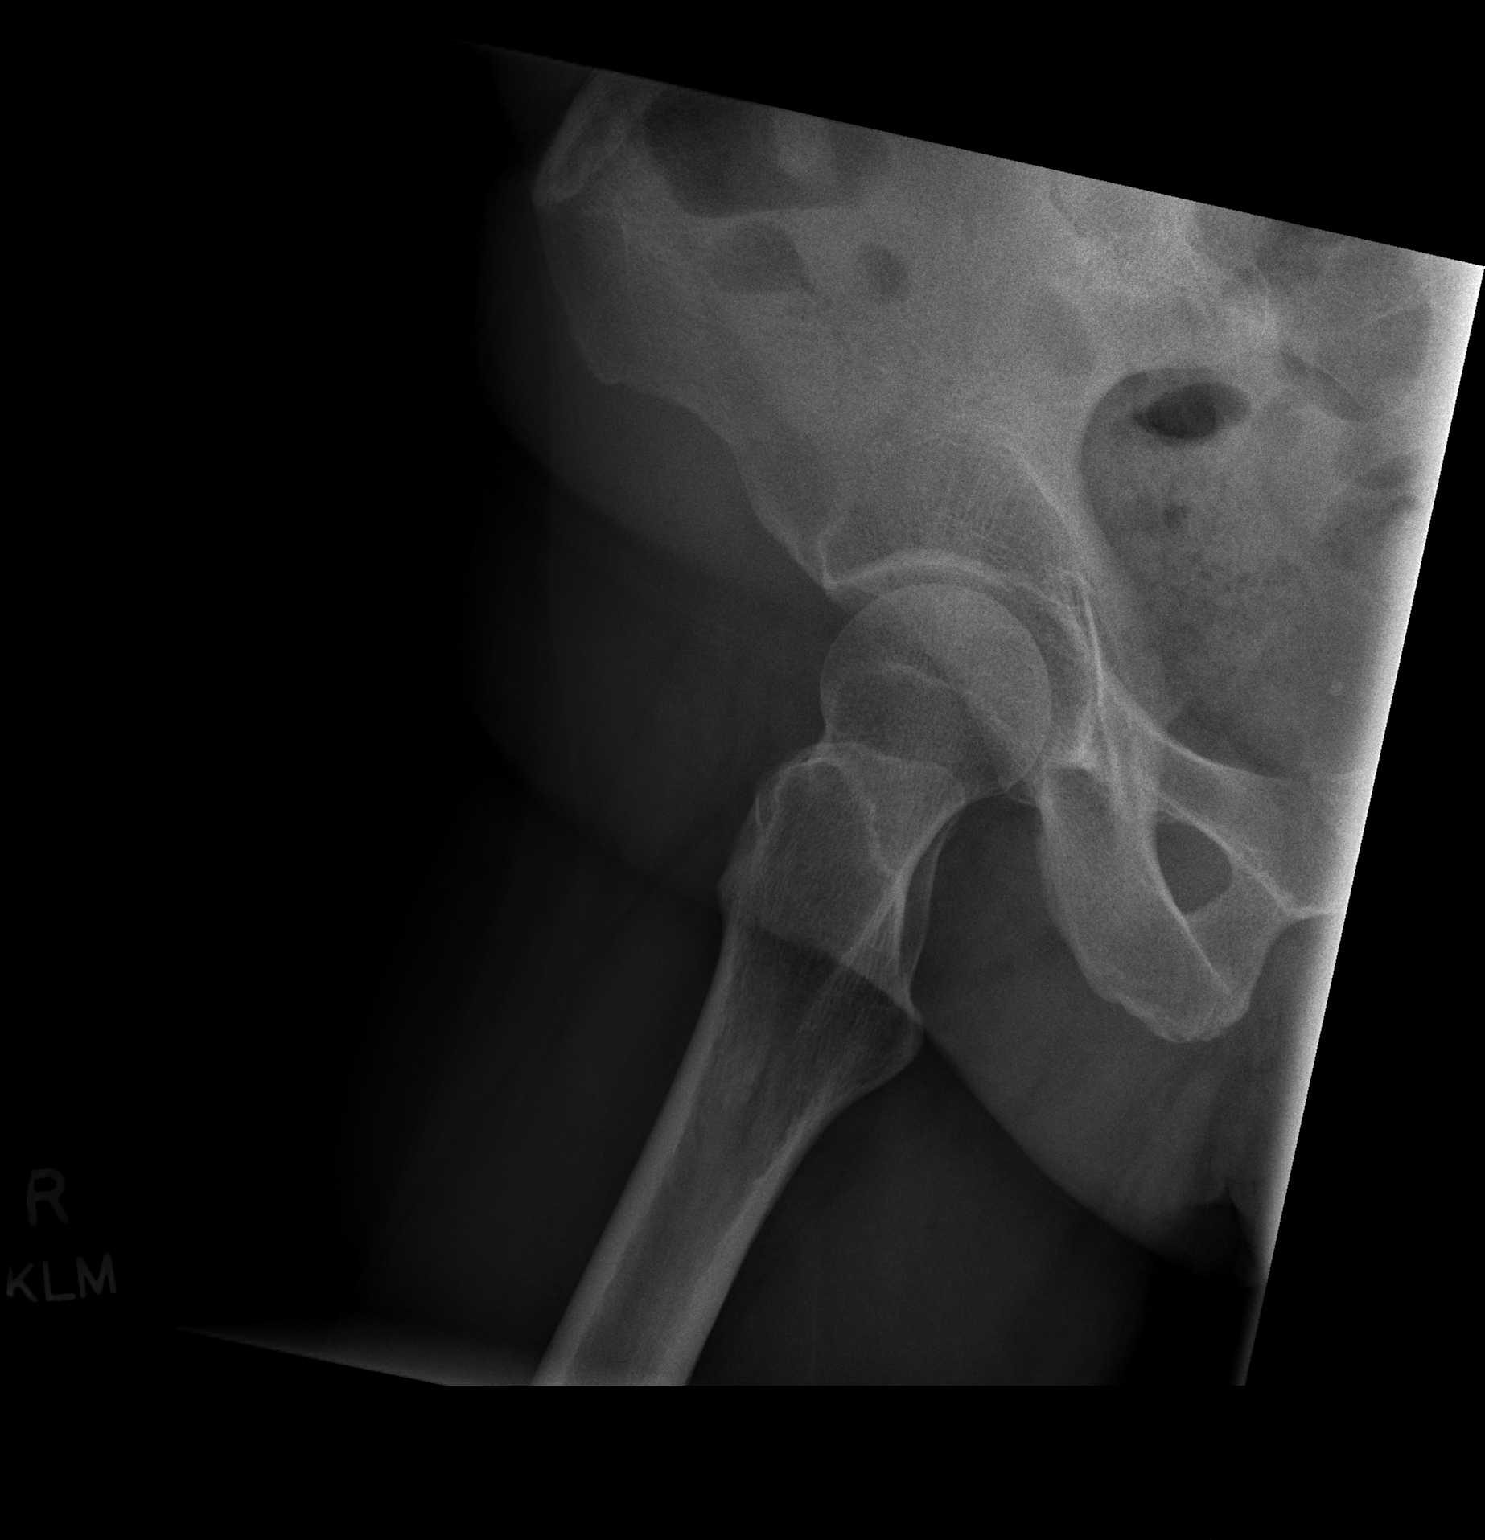

[2 of 2 positions shown; findings below may reference images not displayed]

FINDINGS: There is no evidence of hip fracture or dislocation. There is no
evidence of arthropathy or other focal bone abnormality.
IMPRESSION: Negative.

## 2020-10-26 NOTE — Telephone Encounter (Signed)
Wonderful. Thank you.

## 2020-10-26 NOTE — Telephone Encounter (Signed)
Was the order placed for this, or do I need to do it today?

## 2020-10-29 NOTE — Progress Notes (Signed)
Negative x-ray of hip. Discuss at visit 11/13/2020.

## 2020-11-01 ENCOUNTER — Other Ambulatory Visit: Payer: Self-pay | Admitting: Nurse Practitioner

## 2020-11-01 DIAGNOSIS — F330 Major depressive disorder, recurrent, mild: Secondary | ICD-10-CM

## 2020-11-01 DIAGNOSIS — E7800 Pure hypercholesterolemia, unspecified: Secondary | ICD-10-CM

## 2020-11-13 ENCOUNTER — Encounter: Payer: Self-pay | Admitting: Nurse Practitioner

## 2020-11-13 ENCOUNTER — Other Ambulatory Visit: Payer: Self-pay

## 2020-11-13 ENCOUNTER — Ambulatory Visit (INDEPENDENT_AMBULATORY_CARE_PROVIDER_SITE_OTHER): Payer: BC Managed Care – PPO | Admitting: Nurse Practitioner

## 2020-11-13 VITALS — BP 126/75 | HR 73 | Temp 98.7°F | Ht 60.0 in | Wt 136.5 lb

## 2020-11-13 DIAGNOSIS — M25551 Pain in right hip: Secondary | ICD-10-CM | POA: Insufficient documentation

## 2020-11-13 DIAGNOSIS — I10 Essential (primary) hypertension: Secondary | ICD-10-CM | POA: Diagnosis not present

## 2020-11-13 DIAGNOSIS — R1031 Right lower quadrant pain: Secondary | ICD-10-CM | POA: Insufficient documentation

## 2020-11-13 DIAGNOSIS — F330 Major depressive disorder, recurrent, mild: Secondary | ICD-10-CM

## 2020-11-13 DIAGNOSIS — Z6826 Body mass index (BMI) 26.0-26.9, adult: Secondary | ICD-10-CM

## 2020-11-13 MED ORDER — PREDNISONE 10 MG (48) PO TBPK: ORAL_TABLET | ORAL | 0 refills | Status: DC

## 2020-11-13 MED ORDER — PHENTERMINE HCL 37.5 MG PO TABS: 37.5000 mg | ORAL_TABLET | Freq: Every day | ORAL | 1 refills | Status: DC

## 2020-11-13 MED ORDER — LISINOPRIL-HYDROCHLOROTHIAZIDE 20-12.5 MG PO TABS: 1.0000 | ORAL_TABLET | Freq: Every day | ORAL | 1 refills | Status: DC

## 2020-11-13 NOTE — Patient Instructions (Signed)

## 2020-11-13 NOTE — Progress Notes (Signed)
Established Patient Office Visit  Subjective:  Patient ID: Monica Reed, female    DOB: 09/30/1970  Age: 50 y.o. MRN: [id number]  CC:  Chief Complaint  Patient presents with   Weight Check    HPI Monica Reed presents for follow up visit. She was restarted on phentermine at her last visit. She has lost one pounds since her most recent visit. Reports no negative side effects. Prozac was increased to 3m daily. The patient reports improved mood and irritability. States that she is not crying as frequently or easily as she was. Feels like this dose increase has been positive.  Right hip pain with pins and needles, worsening. Worse with exertion. Negative x-rays of right hip and lumbar spine since most recent visit. Did do a medrol taper for six days. She states that this did help. By day three, pain was starting to return. She states that addition of gabapentin has been helpful. She states that she is only able totake at night. Does make her feel sleepy and a bit dizzy. Does bring her pain down fro 8/10 to about 4/10. She states that once she goes to work and activity level increases, the pain goes right back up/.  Blood pressure is well controlled. She has no new concerns or complaints. She denies chest pain, chest pressure, or shortness of breath. she denies headaches or visual disturbances. She denies abdominal pain, nausea, vomiting, or changes in bowel or bladder habits.    Past Medical History:  Diagnosis Date   Frequent UTI    Hypertension     History reviewed. No pertinent surgical history.  Family History  Problem Relation Age of Onset   Arthritis Mother    Cancer Father        prostate   Heart attack Father    Healthy Sister    Healthy Brother    Hypertension Daughter    Healthy Brother    Healthy Daughter    Healthy Daughter     Social History   Socioeconomic History   Marital status: Married    Spouse name: Not on file   Number of children: Not  on file   Years of education: Not on file   Highest education level: Not on file  Occupational History   Not on file  Tobacco Use   Smoking status: Never   Smokeless tobacco: Never  Vaping Use   Vaping Use: Never used  Substance and Sexual Activity   Alcohol use: No   Drug use: No   Sexual activity: Yes    Partners: Male    Birth control/protection: Pill  Other Topics Concern   Not on file  Social History Narrative   Not on file   Social Determinants of Health   Financial Resource Strain: Not on file  Food Insecurity: Not on file  Transportation Needs: Not on file  Physical Activity: Not on file  Stress: Not on file  Social Connections: Not on file  Intimate Partner Violence: Not on file    Outpatient Medications Prior to Visit  Medication Sig Dispense Refill   FLUoxetine (PROZAC) 20 MG capsule TAKE 3 CAPSULES BY MOUTH EVERY DAY 270 capsule 1   gabapentin (NEURONTIN) 100 MG capsule Take 1 capsule po QpM prn. May increase to IBID as needed and as tolerated 60 capsule 1   loratadine (CLARITIN) 10 MG tablet Take 10 mg by mouth daily.     norethindrone-ethinyl estradiol (LOESTRIN FE) 1-20 MG-MCG tablet Take 1 tablet by  mouth daily.     rosuvastatin (CRESTOR) 5 MG tablet TAKE 1 TABLET (5 MG TOTAL) BY MOUTH DAILY. 30 tablet 5   lisinopril-hydrochlorothiazide (ZESTORETIC) 20-12.5 MG tablet Take 1 tablet by mouth daily. 90 tablet 1   methylPREDNISolone (MEDROL) 4 MG TBPK tablet Take by mouth as directed for 6 days 21 tablet 0   phentermine (ADIPEX-P) 37.5 MG tablet Take 1 tablet (37.5 mg total) by mouth daily before breakfast. 30 tablet 1   No facility-administered medications prior to visit.    Allergies  Allergen Reactions   Cyclobenzaprine Swelling    ROS Review of Systems  Constitutional:  Positive for activity change. Negative for appetite change, chills, fatigue and fever.       Decxreased activity level due to pain in right hip and groin.   HENT:  Negative for  congestion, postnasal drip, rhinorrhea, sinus pressure, sinus pain, sneezing and sore throat.   Eyes: Negative.   Respiratory:  Negative for cough, chest tightness, shortness of breath and wheezing.   Cardiovascular:  Negative for chest pain and palpitations.  Gastrointestinal:  Negative for abdominal pain, constipation, diarrhea, nausea and vomiting.  Endocrine: Negative for cold intolerance, heat intolerance, polydipsia and polyuria.  Genitourinary:  Negative for dyspareunia, dysuria, flank pain, frequency and urgency.  Musculoskeletal:  Positive for arthralgias, gait problem and myalgias. Negative for back pain.       Patient reports no pain in right groin..  Radiates to the right upper leg to just above the right knee.  She denies injury or trauma to the area.  States that range of motion and strength are intact.  Pain is really only bothersome at night. Recent x-rays of the right hip and lower back. X-rays were negative.   Skin:  Negative for rash.  Allergic/Immunologic: Negative for environmental allergies.  Neurological:  Negative for dizziness, weakness and headaches.  Hematological:  Negative for adenopathy.  Psychiatric/Behavioral:  Positive for dysphoric mood. The patient is not nervous/anxious.        Improved anxieyt and depression since increasing dose of prozac.      Objective:    Physical Exam Vitals and nursing note reviewed.  Constitutional:      Appearance: Normal appearance. She is well-developed.  HENT:     Head: Normocephalic and atraumatic.     Nose: Nose normal.     Mouth/Throat:     Mouth: Mucous membranes are moist.  Eyes:     Extraocular Movements: Extraocular movements intact.     Conjunctiva/sclera: Conjunctivae normal.     Pupils: Pupils are equal, round, and reactive to light.  Cardiovascular:     Rate and Rhythm: Normal rate and regular rhythm.     Pulses: Normal pulses.     Heart sounds: Normal heart sounds.  Pulmonary:     Effort: Pulmonary  effort is normal.     Breath sounds: Normal breath sounds.  Abdominal:     Palpations: Abdomen is soft.  Musculoskeletal:        General: Normal range of motion.     Cervical back: Normal range of motion and neck supple.  Lymphadenopathy:     Cervical: No cervical adenopathy.  Skin:    General: Skin is warm and dry.     Capillary Refill: Capillary refill takes less than 2 seconds.  Neurological:     General: No focal deficit present.     Mental Status: She is alert and oriented to person, place, and time.  Psychiatric:  Mood and Affect: Mood normal.        Behavior: Behavior normal.        Thought Content: Thought content normal.        Judgment: Judgment normal.    Today's Vitals   11/13/20 0834  BP: 126/75  Pulse: 73  Temp: 98.7 F (37.1 C)  SpO2: 98%  Weight: 136 lb 8 oz (61.9 kg)  Height: 5' (1.524 m)   Body mass index is 26.66 kg/m.  Wt Readings from Last 3 Encounters:  11/13/20 136 lb 8 oz (61.9 kg)  10/10/20 137 lb 8 oz (62.4 kg)  07/27/20 136 lb 6.4 oz (61.9 kg)     Health Maintenance Due  Topic Date Due   Pneumococcal Vaccine 35-31 Years old (1 - PCV) Never done   Hepatitis C Screening  Never done   Zoster Vaccines- Shingrix (1 of 2) Never done   COLONOSCOPY (Pts 45-48yr Insurance coverage will need to be confirmed)  Never done   COVID-19 Vaccine (3 - Pfizer risk series) 07/01/2019   INFLUENZA VACCINE  Never done    There are no preventive care reminders to display for this patient.  Lab Results  Component Value Date   TSH 1.400 04/20/2020   Lab Results  Component Value Date   WBC 7.1 04/20/2020   HGB 15.5 04/20/2020   HCT 45.8 04/20/2020   MCV 93 04/20/2020   PLT 344 04/20/2020   Lab Results  Component Value Date   NA 137 04/20/2020   K 4.5 04/20/2020   CO2 22 04/20/2020   GLUCOSE 75 04/20/2020   BUN 16 04/20/2020   CREATININE 0.73 04/20/2020   BILITOT 0.4 04/20/2020   ALKPHOS 62 04/20/2020   AST 15 04/20/2020   ALT 9  04/20/2020   PROT 7.7 04/20/2020   ALBUMIN 4.5 04/20/2020   CALCIUM 9.6 04/20/2020   EGFR 101 04/20/2020   Lab Results  Component Value Date   CHOL 277 (H) 04/20/2020   Lab Results  Component Value Date   HDL 56 04/20/2020   Lab Results  Component Value Date   LDLCALC 185 (H) 04/20/2020   Lab Results  Component Value Date   TRIG 193 (H) 04/20/2020   Lab Results  Component Value Date   CHOLHDL 4.9 (H) 04/20/2020   Lab Results  Component Value Date   HGBA1C 5.2 09/21/2018      Assessment & Plan:  1. Hypertension, unspecified type Stable. Continue bp medication as prescribed  - lisinopril-hydrochlorothiazide (ZESTORETIC) 20-12.5 MG tablet; Take 1 tablet by mouth daily.  Dispense: 90 tablet; Refill: 1  2. BMI 26.0-26.9,adult Improving with a one pound weight loss since most recet visit. Continue phentermine 97.587mtablets daily. Discussed lowering calorie intake to 1500 calories per day and incorporating exercise into daily routine to help lose weight. Will monitor.  - phentermine (ADIPEX-P) 37.5 MG tablet; Take 1 tablet (37.5 mg total) by mouth daily before breakfast.  Dispense: 30 tablet; Refill: 1  3. Right hip pain Negative x-ray of both the right hip and lumbar spine. Add 12 day prednisone taper. Take as directed. Continue to take gabapentin as needed and as prescribed. Will refer to orthopedics for further evaluation and treatment.  - predniSONE (STERAPRED UNI-PAK 48 TAB) 10 MG (48) TBPK tablet; 12 day taper - take by mouth as directed for 12 days  Dispense: 48 tablet; Refill: 0 - Ambulatory referral to Sports Medicine  4. Right groin pain Negative x-ray of both the right hip and lumbar  spine. Add 12 day prednisone taper. Take as directed. Continue to take gabapentin as needed and as prescribed. Will refer to orthopedics for further evaluation and treatment.  - Ambulatory referral to Sports Medicine  5. Mild recurrent major depression (HCC) Improved with increased  dose prozac. Will continue at 35m dose for now. Once stable, will consider cutting back dosing if indicated.    Problem List Items Addressed This Visit       Cardiovascular and Mediastinum   Hypertension - Primary   Relevant Medications   lisinopril-hydrochlorothiazide (ZESTORETIC) 20-12.5 MG tablet     Other   BMI 26.0-26.9,adult   Relevant Medications   phentermine (ADIPEX-P) 37.5 MG tablet   Mild recurrent major depression (HCC)   Right hip pain   Relevant Medications   predniSONE (STERAPRED UNI-PAK 48 TAB) 10 MG (48) TBPK tablet   Other Relevant Orders   Ambulatory referral to Sports Medicine   Right groin pain   Relevant Orders   Ambulatory referral to Sports Medicine    Meds ordered this encounter  Medications   lisinopril-hydrochlorothiazide (ZESTORETIC) 20-12.5 MG tablet    Sig: Take 1 tablet by mouth daily.    Dispense:  90 tablet    Refill:  1    Order Specific Question:   Supervising Provider    Answer:   MBeatrice LecherD [[id number]]   phentermine (ADIPEX-P) 37.5 MG tablet    Sig: Take 1 tablet (37.5 mg total) by mouth daily before breakfast.    Dispense:  30 tablet    Refill:  1    Order Specific Question:   Supervising Provider    Answer:   MBeatrice LecherD [[id number]]   predniSONE (STERAPRED UNI-PAK 48 TAB) 10 MG (48) TBPK tablet    Sig: 12 day taper - take by mouth as directed for 12 days    Dispense:  48 tablet    Refill:  0    Order Specific Question:   Supervising Provider    Answer:   MBeatrice LecherD [[id number]]     Follow-up: Return in about 4 weeks (around 12/11/2020) for routine - weight management.    HRonnell Freshwater NP

## 2020-11-16 ENCOUNTER — Ambulatory Visit
Admission: RE | Admit: 2020-11-16 | Discharge: 2020-11-16 | Disposition: A | Discharge status: Home / Self Care | Payer: BC Managed Care – PPO | Source: Ambulatory Visit | Attending: Obstetrics and Gynecology | Admitting: Obstetrics and Gynecology

## 2020-11-16 ENCOUNTER — Other Ambulatory Visit: Payer: Self-pay

## 2020-11-16 DIAGNOSIS — N63 Unspecified lump in unspecified breast: Secondary | ICD-10-CM

## 2020-11-16 DIAGNOSIS — R922 Inconclusive mammogram: Secondary | ICD-10-CM | POA: Diagnosis not present

## 2020-11-16 IMAGING — US US BREAST*L* LIMITED INC AXILLA
1 series · 6 of 6 positions shown · non-contrast
Comparison: Previous exam(s).

CLINICAL DATA: Short-term follow-up for a likely benign left breast
mass.

EXAM:
DIGITAL DIAGNOSTIC BILATERAL MAMMOGRAM WITH TOMOSYNTHESIS AND CAD;
ULTRASOUND LEFT BREAST LIMITED
TECHNIQUE: Bilateral digital diagnostic mammography and breast tomosynthesis
was performed. The images were evaluated with computer-aided
detection.; Targeted ultrasound examination of the left breast was
performed.

[Series 1: us breast*left* limited inc axilla · 0.05mm/px · 6 of 6 slices shown]
[im 1/6]
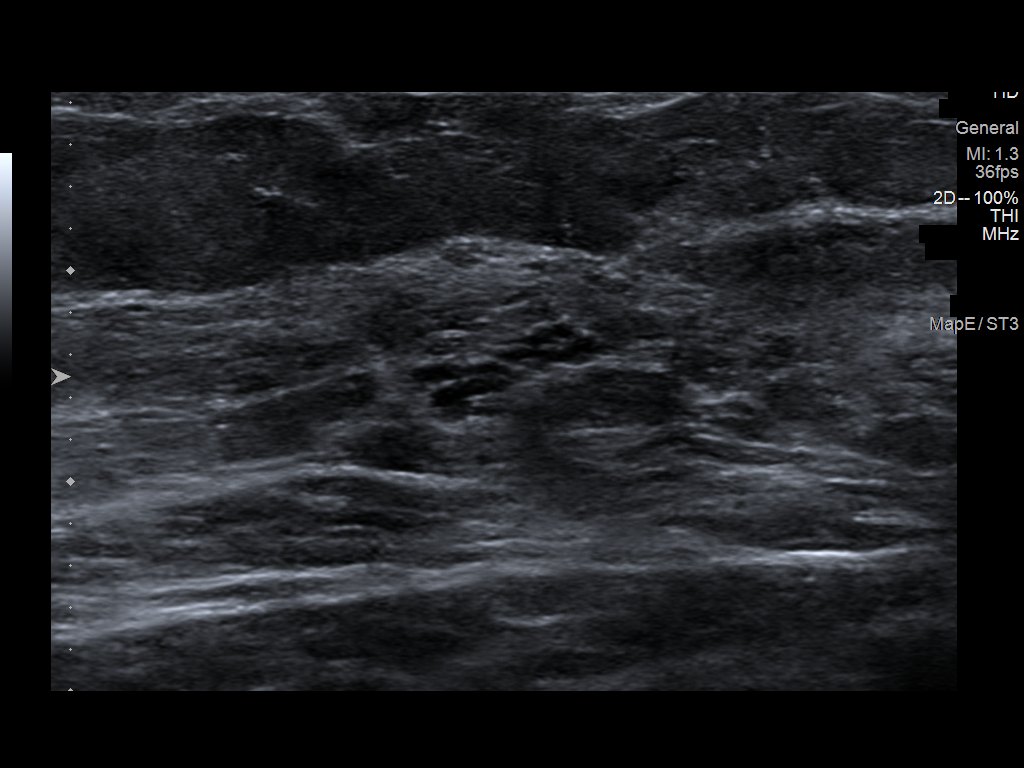
[im 2/6]
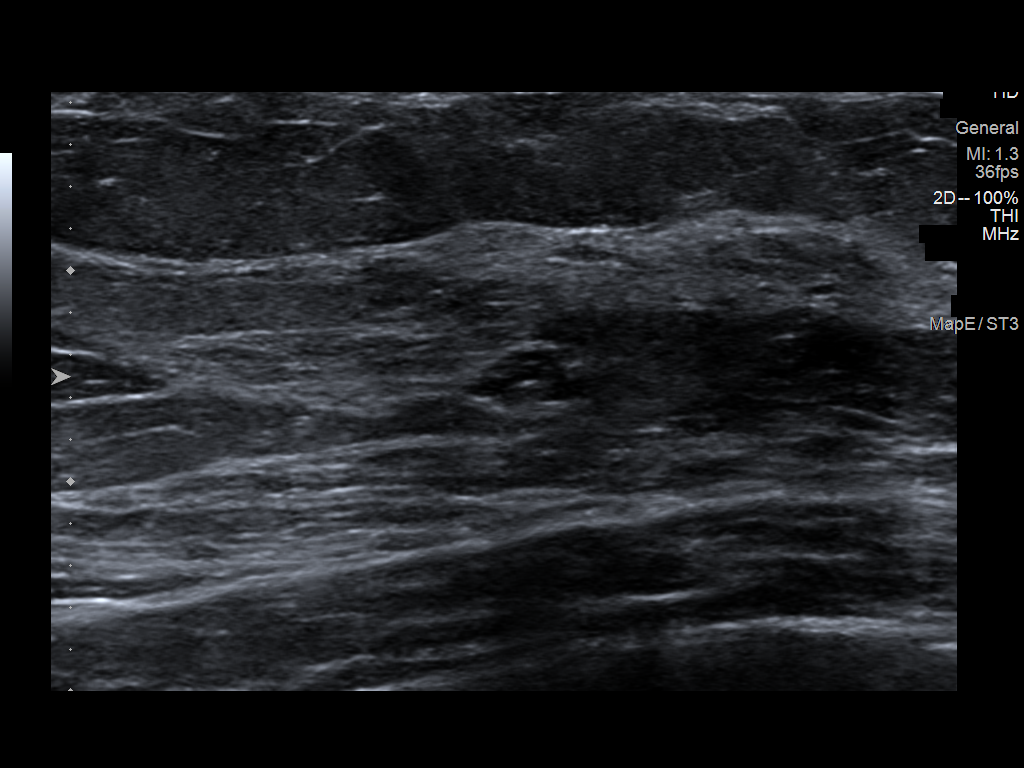
[im 3/6]
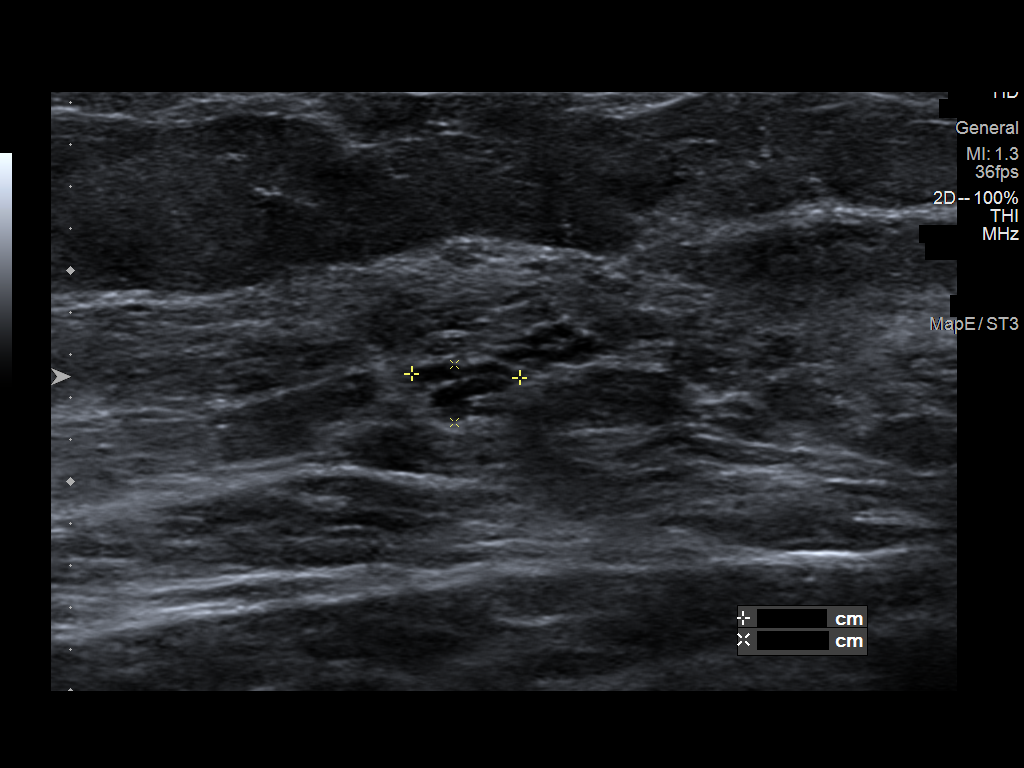
[im 4/6]
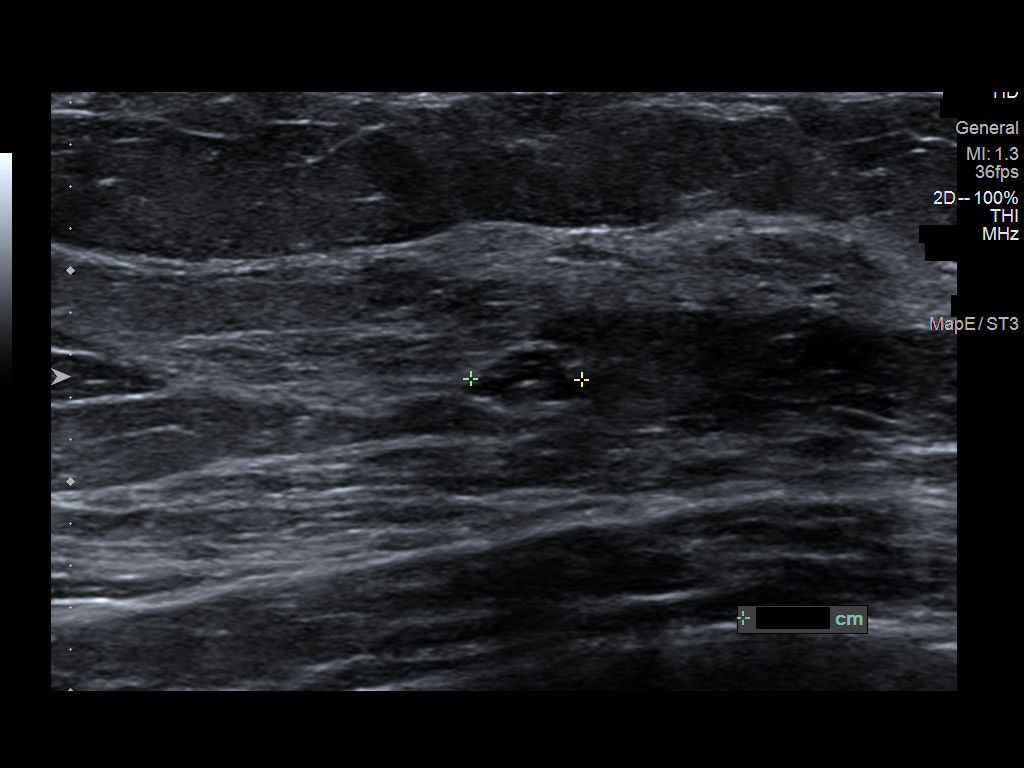
[im 5/6]
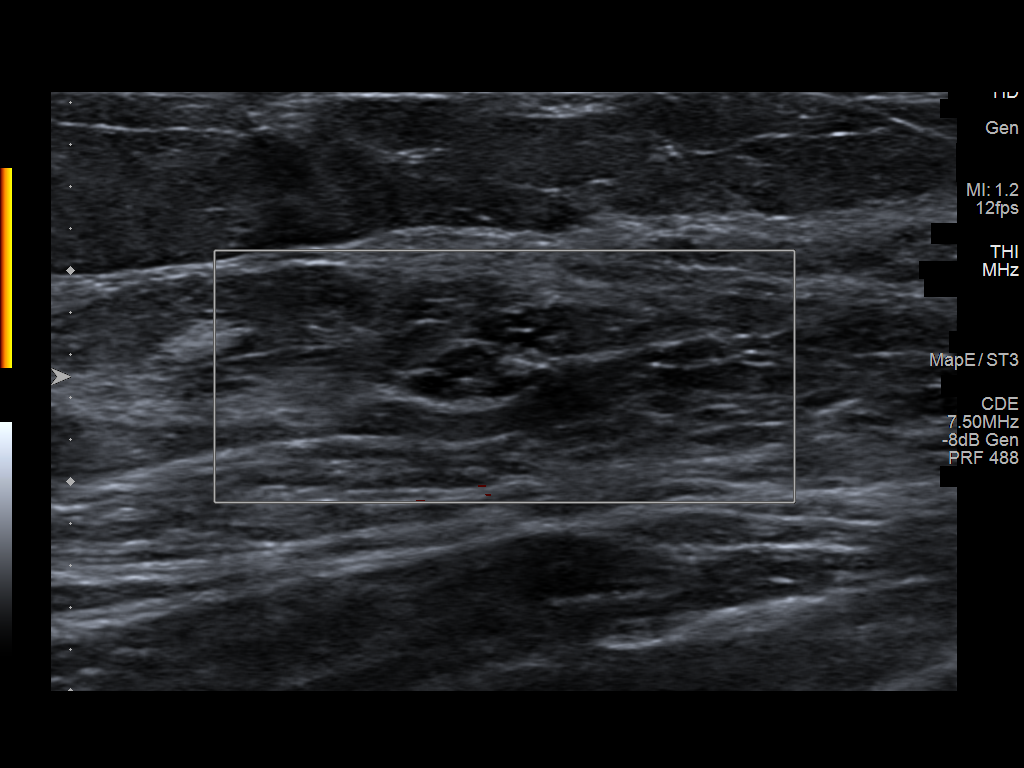
[im 6/6]
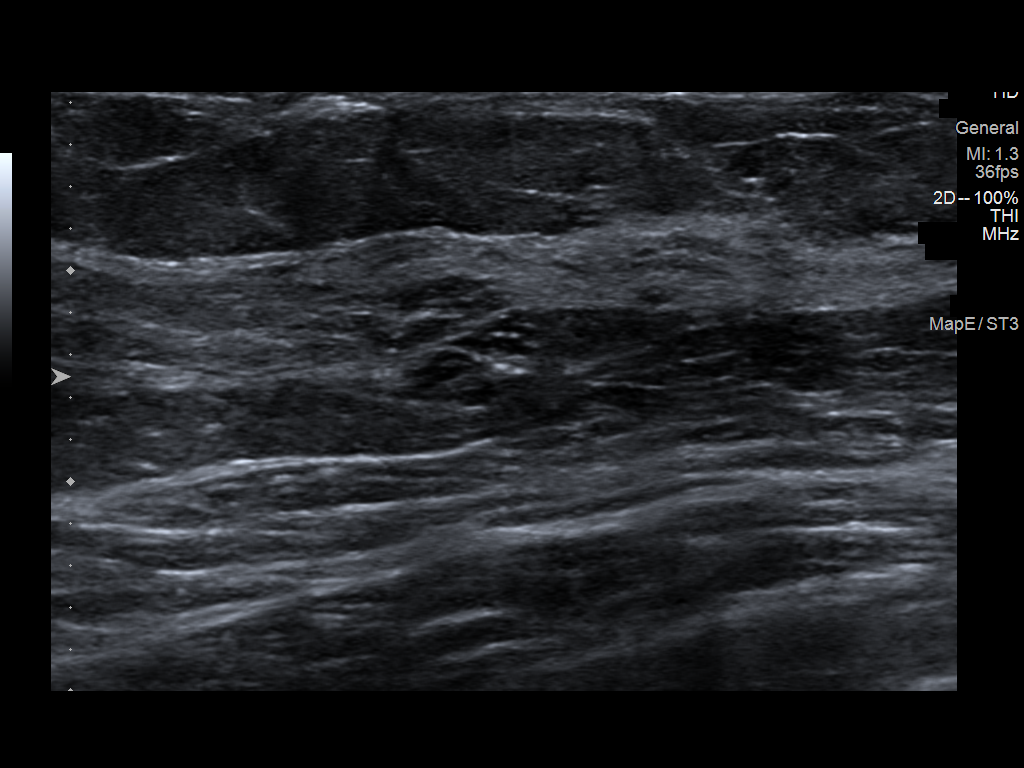

[6 of 6 positions shown; findings below may reference images not displayed]

ACR Breast Density Category b: There are scattered areas of
fibroglandular density.
FINDINGS: No suspicious calcifications, masses or areas of distortion are seen
in the bilateral breasts.

Ultrasound targeted to the left breast at 2 o'clock, 4 cm from the
nipple demonstrates a stable cluster of cysts, previously measuring
7 x 3 x 5 mm in [DATE].
IMPRESSION: 1. The left breast mass at 2 o'clock has demonstrated 2 years of
stability, and is therefore benign.

2.  No mammographic evidence of malignancy in the bilateral breasts.

RECOMMENDATION:
Screening mammogram in one year.(Code:[P5])

I have discussed the findings and recommendations with the patient.
If applicable, a reminder letter will be sent to the patient
regarding the next appointment.

BI-RADS CATEGORY  2: Benign.

## 2020-11-16 IMAGING — MG DIGITAL DIAGNOSTIC BILAT W/ TOMO W/ CAD
6 of 10 series · 6 of 30 positions shown · non-contrast
Comparison: Previous exam(s).

CLINICAL DATA: Short-term follow-up for a likely benign left breast
mass.

EXAM:
DIGITAL DIAGNOSTIC BILATERAL MAMMOGRAM WITH TOMOSYNTHESIS AND CAD;
ULTRASOUND LEFT BREAST LIMITED
TECHNIQUE: Bilateral digital diagnostic mammography and breast tomosynthesis
was performed. The images were evaluated with computer-aided
detection.; Targeted ultrasound examination of the left breast was
performed.

[R MLO synth-2D]
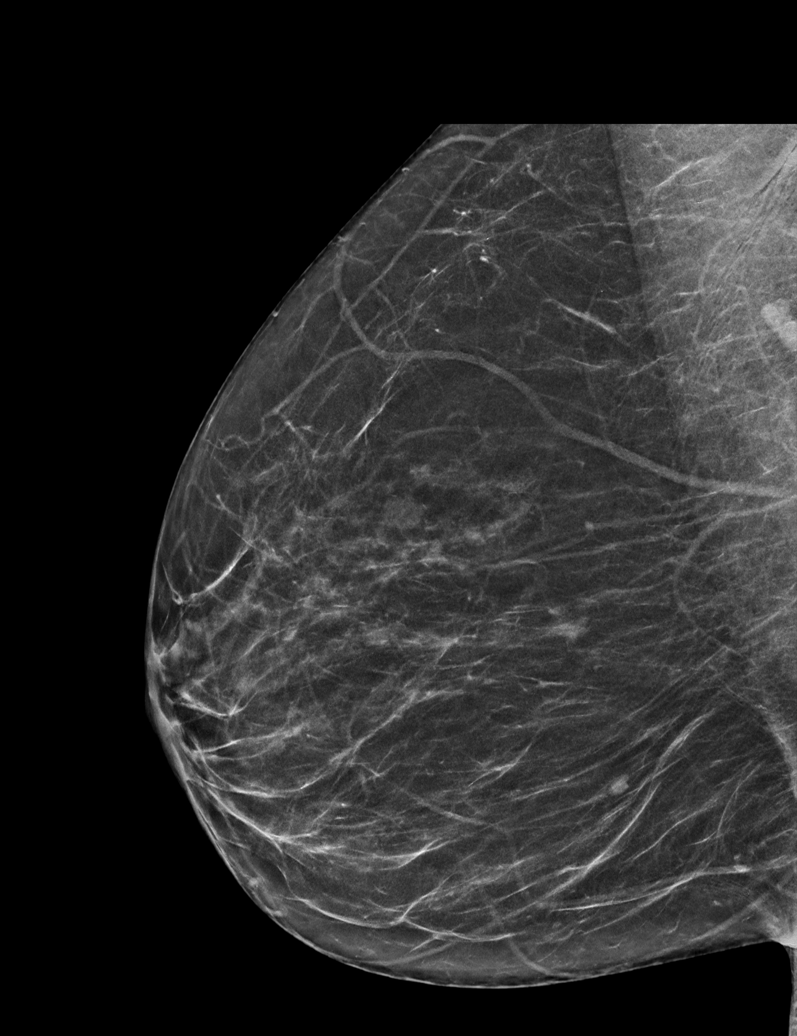

[L MLO synth-2D (1 of 2)]
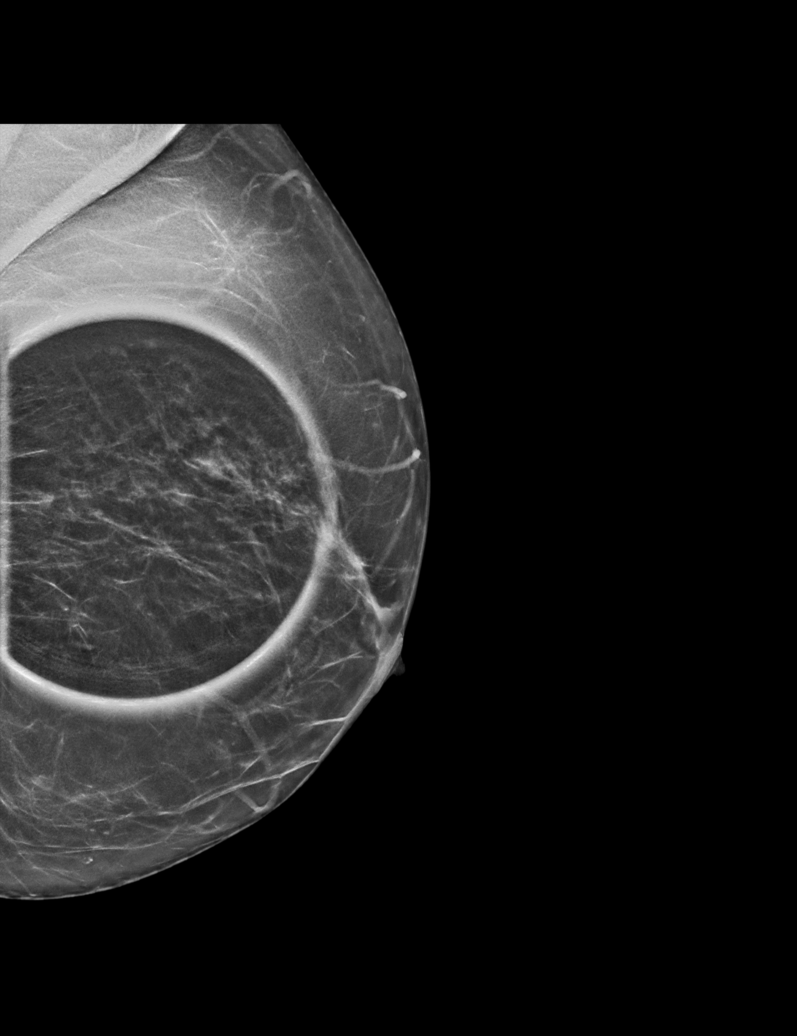

[L MLO synth-2D (2 of 2)]
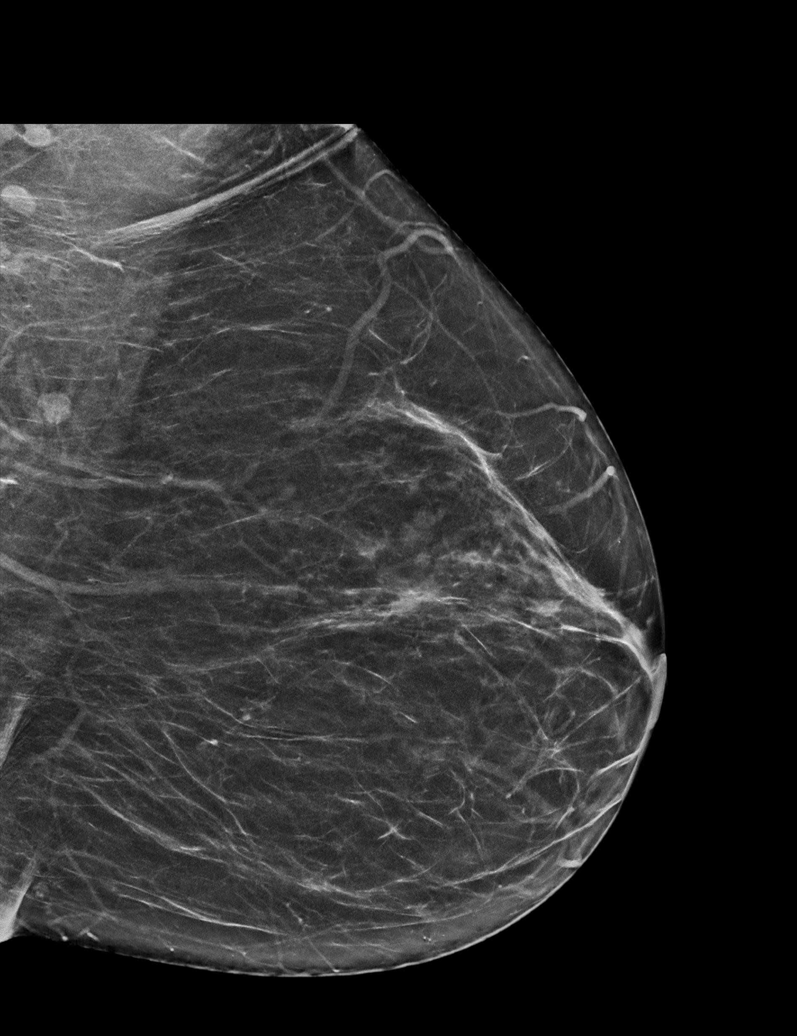

[R CC synth-2D]
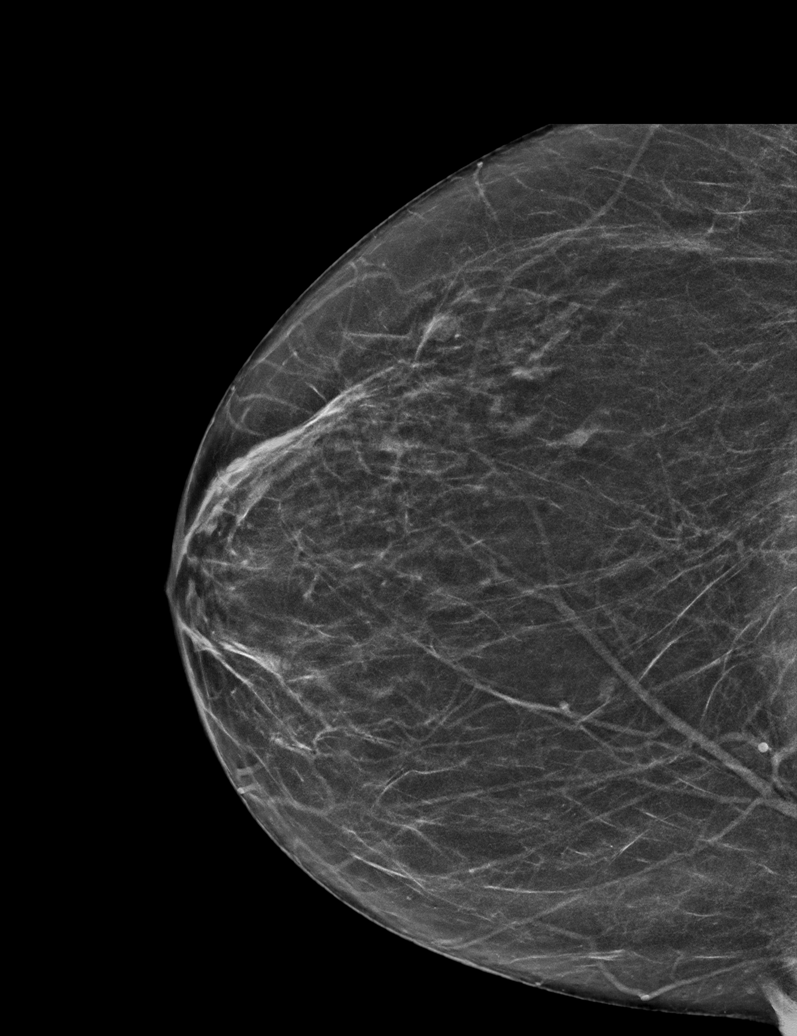

[L CC synth-2D]
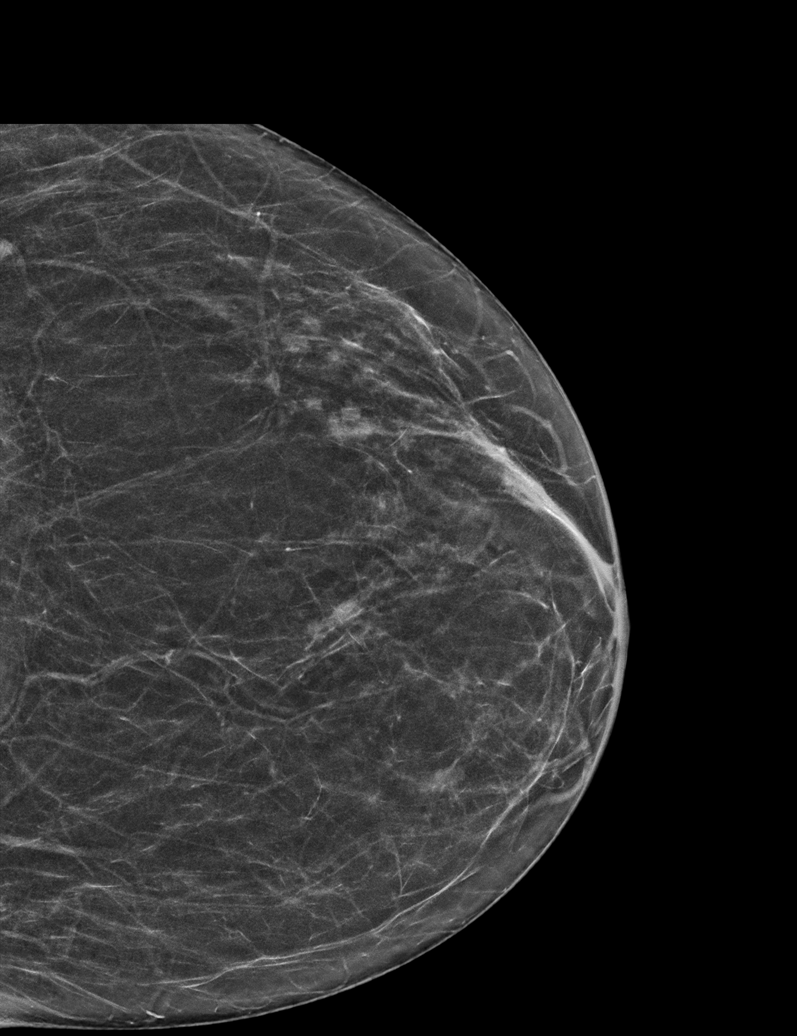

[L CC tomo · tomo slice 28/55.0]
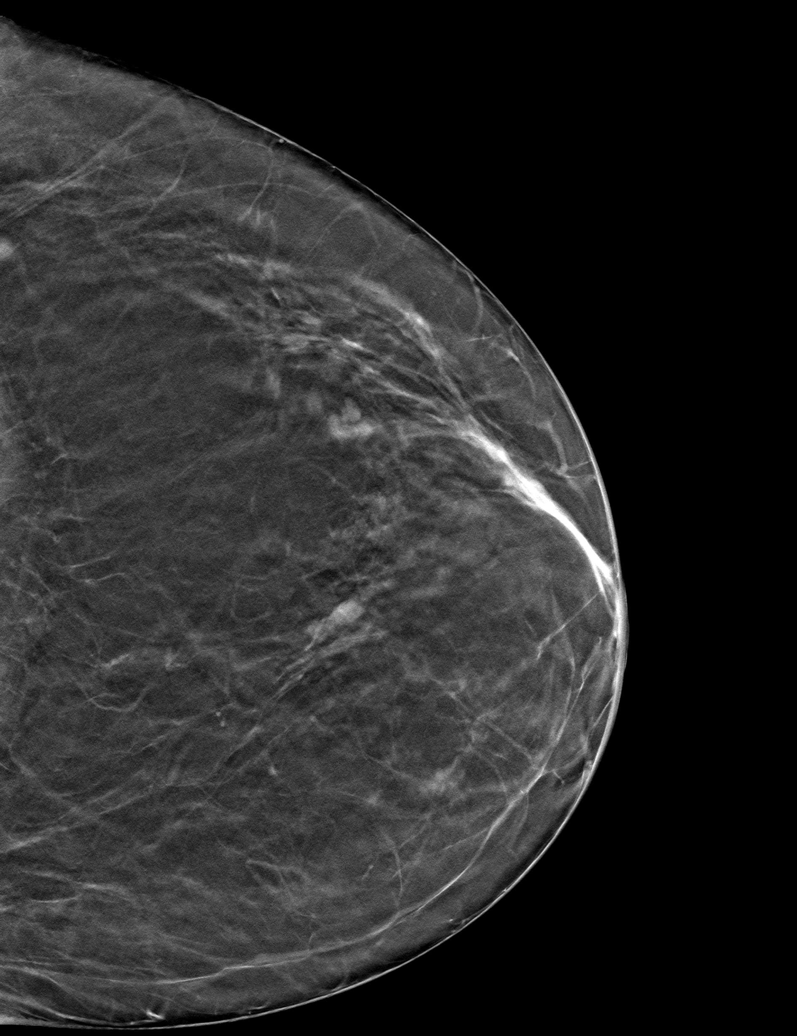

[6 of 30 positions shown; findings below may reference images not displayed]

ACR Breast Density Category b: There are scattered areas of
fibroglandular density.
FINDINGS: No suspicious calcifications, masses or areas of distortion are seen
in the bilateral breasts.

Ultrasound targeted to the left breast at 2 o'clock, 4 cm from the
nipple demonstrates a stable cluster of cysts, previously measuring
7 x 3 x 5 mm in [DATE].
IMPRESSION: 1. The left breast mass at 2 o'clock has demonstrated 2 years of
stability, and is therefore benign.

2.  No mammographic evidence of malignancy in the bilateral breasts.

RECOMMENDATION:
Screening mammogram in one year.(Code:[P5])

I have discussed the findings and recommendations with the patient.
If applicable, a reminder letter will be sent to the patient
regarding the next appointment.

BI-RADS CATEGORY  2: Benign.

## 2020-11-20 ENCOUNTER — Ambulatory Visit: Payer: BC Managed Care – PPO | Admitting: Family Medicine

## 2020-11-20 ENCOUNTER — Ambulatory Visit: Payer: Self-pay

## 2020-11-20 ENCOUNTER — Other Ambulatory Visit: Payer: Self-pay

## 2020-11-20 VITALS — BP 134/84 | HR 77 | Ht 60.0 in | Wt 137.4 lb

## 2020-11-20 DIAGNOSIS — M25551 Pain in right hip: Secondary | ICD-10-CM | POA: Diagnosis not present

## 2020-11-20 NOTE — Progress Notes (Addendum)
I, Philbert Riser, LAT, ATC acting as a scribe for Monica Graham, MD.  Subjective:    CC: R hip pain  HPI: Pt is a 50 y/o female c/o R hip pain x about 1 month. Pt locates pain to anterior aspect of her R hip, into R groin, w/ radiating pain along anterior thigh. Pt works at a [employer] and is on her feet a lot, lifting heavy objects, walking, and carrying items.  Low back pain: yes- midline, 1st noticed LBP yesterday while at work Radiates: yes LE Numbness/tingling: no LE Weakness: yes- sometimes Aggravates: increased pain at night, lifting things, brisk walking, driving Treatments tried: gabapentin, prednisone (hasn't started yet), Tylenol  Dx imaging: 10/26/20 R hip & L-spine XR  12/13/19 Pelvis MRI  Pertinent review of Systems: No fevers or chills  Relevant historical information: Company secretary.   Objective:    Vitals:   11/20/20 1246  BP: 134/84  Pulse: 77  SpO2: 98%   General: Well Developed, well nourished, and in no acute distress.   MSK: Right hip normal-appearing Nontender. Normal range of motion however patient does experience pain with hip flexion and internal rotation. Strength 4/5 to hip flexion with pain.  Pain also present to hip flexion with hip externally rotated. Hip abduction also slightly diminished 4/5 with some pain. Internal rotation and adduction strength intact Otherwise lower extremity strength is intact. L-spine nontender midline normal lumbar motion.  Reflexes and sensation are intact throughout.  Lower extremity strength is intact throughout bilateral extremities. Negative slump test.  Lab and Radiology Results  Diagnostic Limited MSK Ultrasound of: Right anterior hip Anterior femoral acetabular joint visualized.  Small joint effusion present. Anterior labrum normal appearing to ultrasound. Impression: No large joint effusion or large anterior labral tear or paralabral cyst visible on ultrasound   EXAM: DG HIP (WITH OR  WITHOUT PELVIS) 2-3V RIGHT   COMPARISON:  Prior MRI of the pelvis 12/13/2019   FINDINGS: There is no evidence of hip fracture or dislocation. There is no evidence of arthropathy or other focal bone abnormality.   IMPRESSION: Negative.     Electronically Signed   By: Malachy Moan M.D.   On: 10/29/2020 08:39  EXAM: LUMBAR SPINE - COMPLETE 4+ VIEW   COMPARISON:  Concurrently obtained radiographs of the right hip   FINDINGS: There is no evidence of lumbar spine fracture. Alignment is normal. Intervertebral disc spaces are maintained.   IMPRESSION: Negative.     Electronically Signed   By: Malachy Moan M.D.   On: 10/29/2020 08:39  EXAM: MRI PELVIS WITHOUT CONTRAST   TECHNIQUE: Multiplanar multisequence MR imaging of the pelvis was performed. No intravenous contrast was administered.   COMPARISON:  None.   FINDINGS: Bones/Joint/Cartilage   Marrow signal is normal throughout without fracture, stress change or focal lesion. No subchondral cyst formation or edema about the hips. No avascular necrosis of the femoral heads. Visualized lower lumbar spine is unremarkable.   Ligaments   Intact and normal in appearance.   Muscles and Tendons   A small amount of fluid is seen about the distal gluteus medius and minimus tendons bilaterally compatible with tendinosis, more notable on the right. No tear is identified.   Soft tissues   Negative. A marker is placed over the region of concern peripheral to the right ilium. No underlying fluid collection, mass or other abnormality is seen.   IMPRESSION: No abnormality is seen about a marker placed in the region of concern.   Mild  appearing bilateral gluteus medius and minimus tendinosis without tear is more notable on the right.     Electronically Signed   By: Drusilla Kanner M.D.   On: 12/13/2019 13:30   I, Monica Reed, personally (independently) visualized and performed the interpretation of the  images attached in this note.   Impression and Recommendations:    Assessment and Plan: 50 y.o. female with right anterior hip to groin pain ongoing for about a month.  Pain thought to be due to either hip flexor tendinitis or anterior hip pathology not seen on x-ray such as labrum tear or perhaps even L2 lumbar radiculopathy.  After discussion plan for trial of physical therapy.  If not improved in 6 weeks patient will notify me and we will likely proceed with MRI arthrogram with steroid injection. She already has had a pretty good work-up with her PCP including x-ray hip and L-spine that did not show obvious pathology to explain her current pain.  Additionally she had a MRI of her pelvis last year that did not show any significant intra-articular hip pathology although her pain has only been going on about a month. . Addendum: 11/21/20 Patient called back noting that her pain is significant and she is having significant limitation in her ability to work and activities of daily living.  She would like to proceed directly to MRI arthrogram.  I think this is reasonable to further evaluate potential for femoral acetabular impingement more labrum tear.  Will order MRI arthrogram.  Encourage patient to proceed with home exercise program that we discussed in clinic on October 31 as well.  PDMP not reviewed this encounter. Orders Placed This Encounter  Procedures   [place] LIMITED JOINT SPACE STRUCTURES LOW RIGHT(NO LINKED CHARGES)    Standing Status:   Future    Number of Occurrences:   1    Standing Expiration Date:   05/20/2021    Order Specific Question:   Reason for Exam (SYMPTOM  OR DIAGNOSIS REQUIRED)    Answer:   right hip pain    Order Specific Question:   Preferred imaging location?    Answer:   [facility] referral to Physical Therapy    Referral Priority:   Routine    Referral Type:   Physical Medicine    Referral Reason:   Specialty Services Required     Requested Specialty:   Physical Therapy    Number of Visits Requested:   1   No orders of the defined types were placed in this encounter.   Discussed warning signs or symptoms. Please see discharge instructions. Patient expresses understanding.   The above documentation has been reviewed and is accurate and complete Monica Reed, M.D.

## 2020-11-20 NOTE — Patient Instructions (Addendum)
Thank you for coming in today.   Send me a message in 6 weeks, if not better, please let me know and we will proceed to MRI arthrogram.  I've referred you to Physical Therapy.  Let us know if you don't hear from them in one week.

## 2020-11-21 ENCOUNTER — Other Ambulatory Visit: Payer: Self-pay | Admitting: Physical Therapy

## 2020-11-21 ENCOUNTER — Telehealth: Payer: Self-pay | Admitting: Family Medicine

## 2020-11-21 DIAGNOSIS — M25551 Pain in right hip: Secondary | ICD-10-CM

## 2020-11-21 NOTE — Progress Notes (Signed)
R hip MRI arthrogram ordered.

## 2020-11-21 NOTE — Telephone Encounter (Signed)
MRI arthrogram ordered to [facility].  They will call the pt to schedule.

## 2020-11-21 NOTE — Telephone Encounter (Signed)
Patient called and said that she does not want to do therapy at this time and would like to go ahead and have the MRI done. Can this be ordered for her please?

## 2020-11-22 NOTE — Telephone Encounter (Signed)
Called pt and left detailed message w/ info regarding hip MRI order and that she should anticipate a call from [facility] regarding MRI scheduling.

## 2020-12-05 DIAGNOSIS — Z01419 Encounter for gynecological examination (general) (routine) without abnormal findings: Secondary | ICD-10-CM | POA: Diagnosis not present

## 2020-12-10 ENCOUNTER — Other Ambulatory Visit: Payer: Self-pay | Admitting: Nurse Practitioner

## 2020-12-10 DIAGNOSIS — M5136 Other intervertebral disc degeneration, lumbar region: Secondary | ICD-10-CM

## 2020-12-13 ENCOUNTER — Ambulatory Visit: Payer: BC Managed Care – PPO | Admitting: Nurse Practitioner

## 2020-12-18 ENCOUNTER — Ambulatory Visit (INDEPENDENT_AMBULATORY_CARE_PROVIDER_SITE_OTHER): Payer: BC Managed Care – PPO

## 2020-12-18 ENCOUNTER — Other Ambulatory Visit: Payer: Self-pay

## 2020-12-18 ENCOUNTER — Ambulatory Visit: Payer: BC Managed Care – PPO | Admitting: Sports Medicine

## 2020-12-18 DIAGNOSIS — M25551 Pain in right hip: Secondary | ICD-10-CM

## 2020-12-18 DIAGNOSIS — M1611 Unilateral primary osteoarthritis, right hip: Secondary | ICD-10-CM | POA: Diagnosis not present

## 2020-12-18 DIAGNOSIS — S73191A Other sprain of right hip, initial encounter: Secondary | ICD-10-CM | POA: Diagnosis not present

## 2020-12-18 IMAGING — MR MR HIP*R* W/CM
5 of 6 series · 32 of 40 positions shown · IV contrast (gadavist)
Comparison: Radiographs [DATE]

CLINICAL DATA: Right hip pain.

EXAM:
MRI OF THE RIGHT HIP WITH CONTRAST
TECHNIQUE: Multiplanar, multisequence MR imaging was performed following the
administration of intravenous contrast.
CONTRAST:  1mL GADAVIST GADOBUTROL 1 MMOL/ML IV SOLN

[Series 4: T1 · coronal · 4.0mm · 0.62mm/px · 8 of 34 slices shown]
[im 1/34]
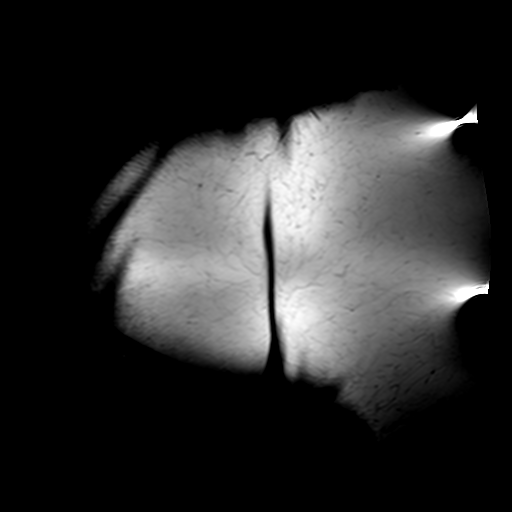
[im 5/34]
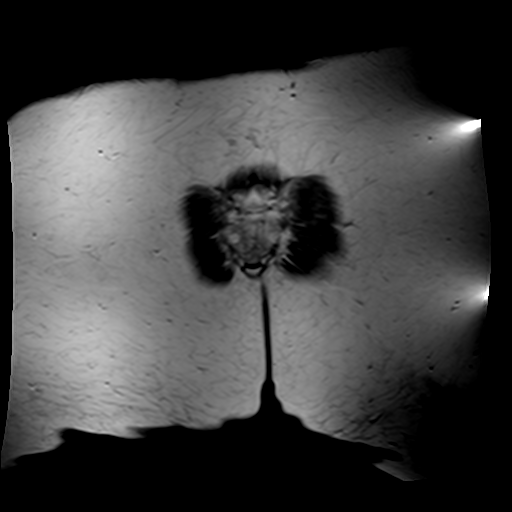
[im 10/34]
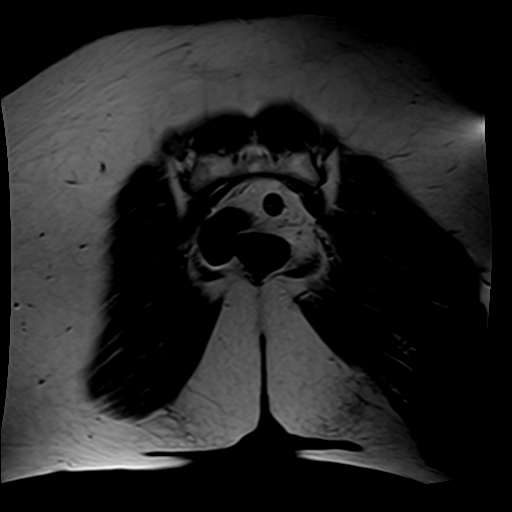
[im 15/34]
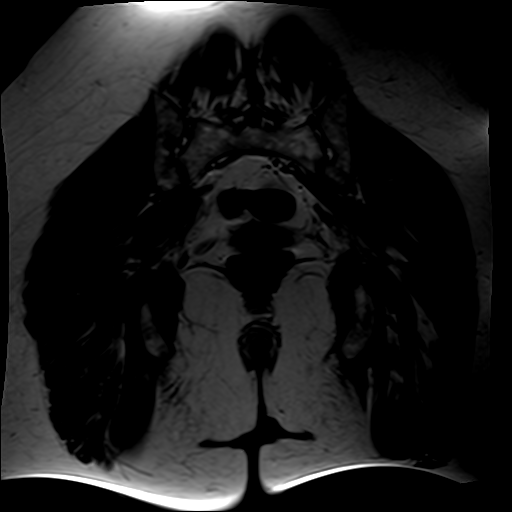
[im 19/34]
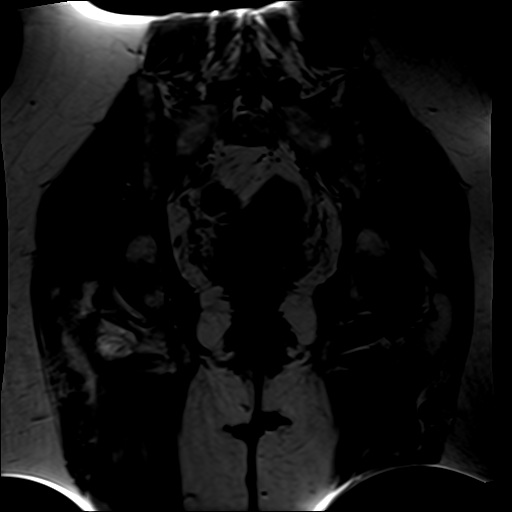
[im 24/34]
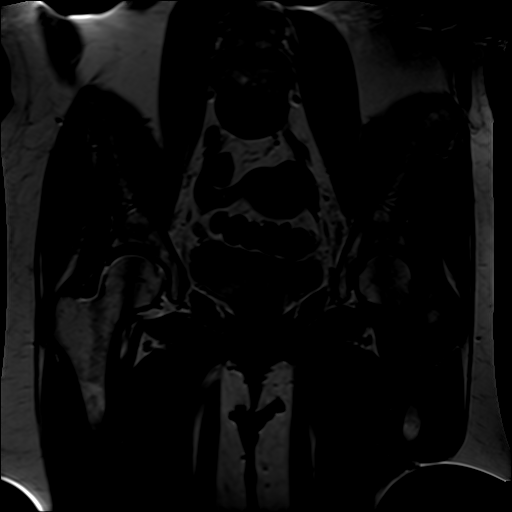
[im 29/34]
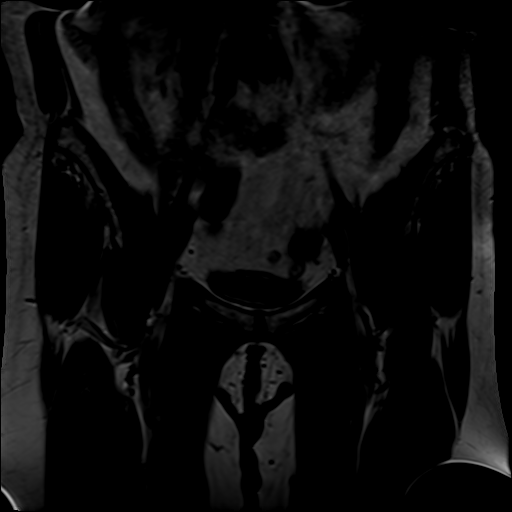
[im 34/34]
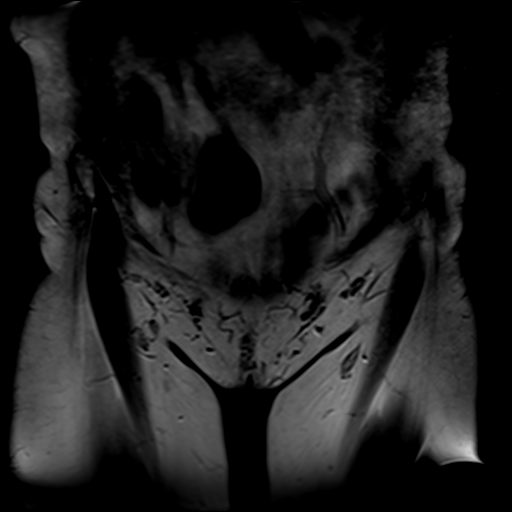

[Series 5: T2 fat-sat · axial · 4.0mm · 0.62mm/px · z∈[-84,+47]mm · 7 of 30 slices shown]
[im 1/30]
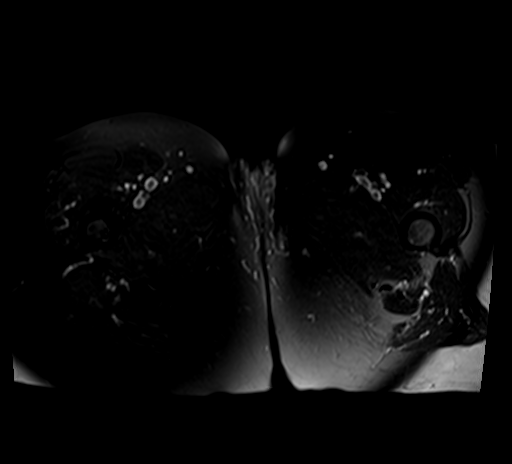
[im 5/30]
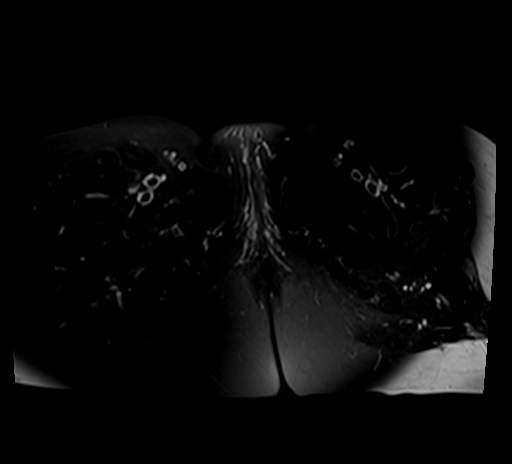
[im 10/30]
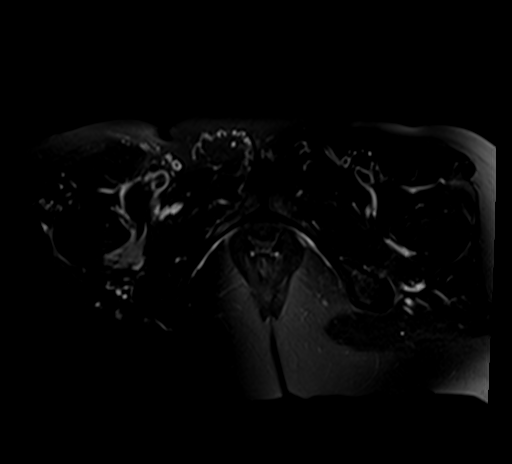
[im 15/30]
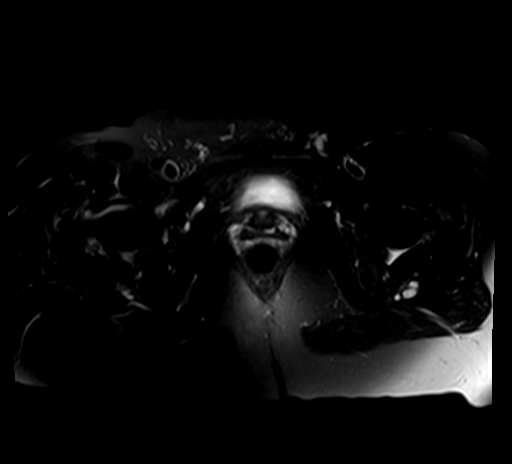
[im 20/30]
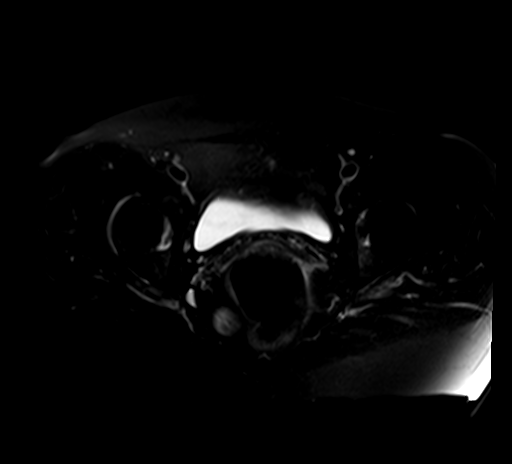
[im 25/30]
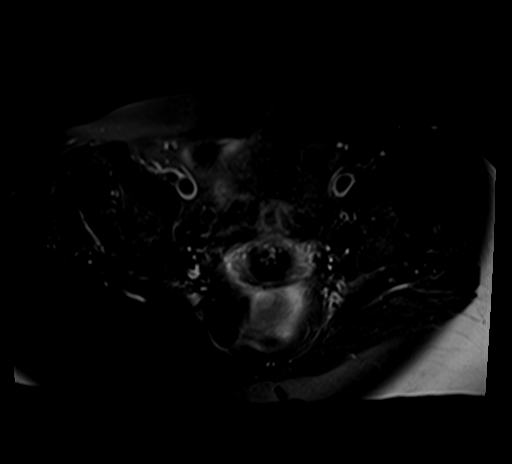
[im 30/30]
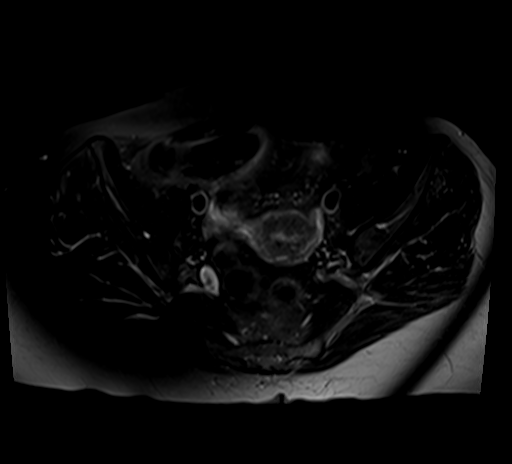

[Series 6: T1 fat-sat · axial · 4.0mm · 0.70mm/px · z∈[-114,-27]mm · 6 of 24 slices shown (1 of 3)]
[im 1/24]
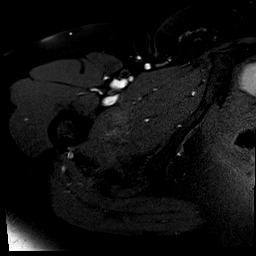
[im 5/24]
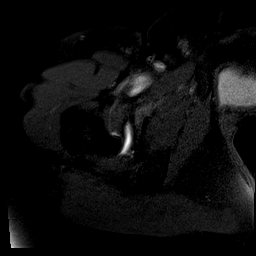
[im 10/24]
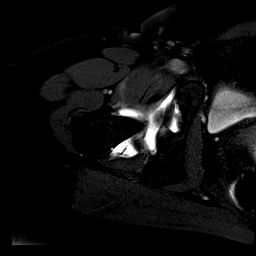
[im 14/24]
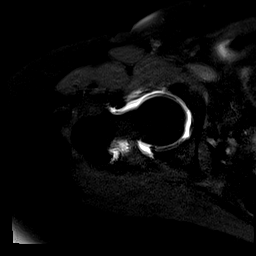
[im 19/24]
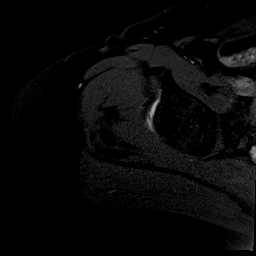
[im 24/24]
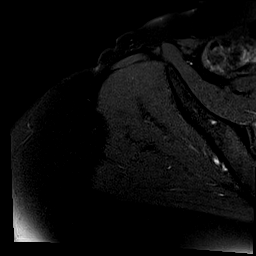

[Series 7: T1 fat-sat · coronal · 4.0mm · 0.70mm/px · 5 of 23 slices shown (2 of 3)]
[im 1/23]
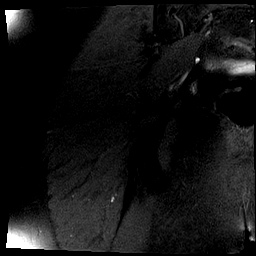
[im 6/23]
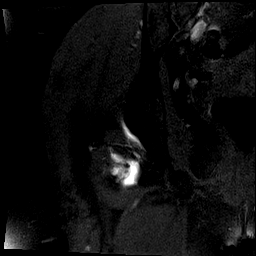
[im 12/23]
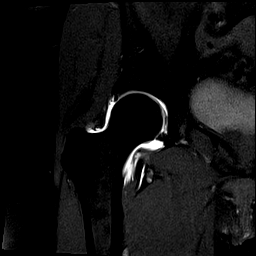
[im 17/23]
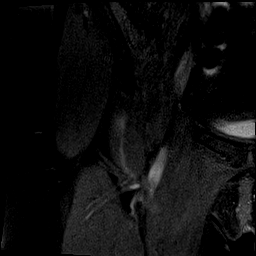
[im 23/23]
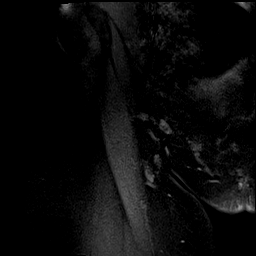

[Series 8: T1 fat-sat · sagittal · 4.0mm · 0.70mm/px · 6 of 28 slices shown (3 of 3)]
[im 1/28]
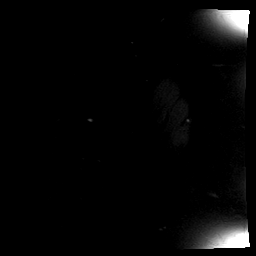
[im 6/28]
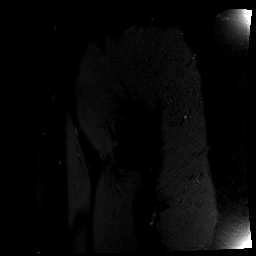
[im 11/28]
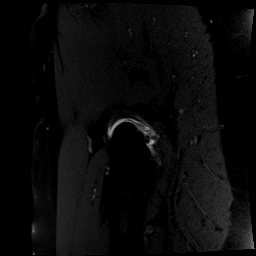
[im 17/28]
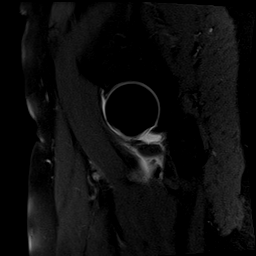
[im 22/28]
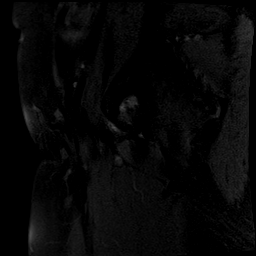
[im 28/28]
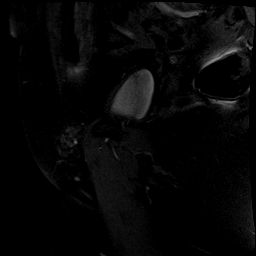

[32 of 40 positions shown; findings below may reference images not displayed]

FINDINGS: Both hips are normally located. No stress fracture or AVN. Mild
degenerative changes bilaterally. Mild degenerative chondrosis
without full-thickness cartilage defect. No morphologic findings for
femoroacetabular impingement. There is a superior anterior labral
tear involving the right hip.

The pubic symphysis and SI joints are intact. No bone lesions or
fractures.

The hip and pelvic musculature are unremarkable. No muscle tear,
myositis or mass. Mild bilateral peritendinosis but no findings for
trochanteric bursitis. The hamstring tendons are intact.

No significant intrapelvic abnormalities are identified.
IMPRESSION: 1. Mild bilateral hip joint degenerative changes but no stress
fracture or AVN.
2. Superior anterior labral tear involving the right hip.
3. Mild bilateral peritendinosis but no findings for trochanteric
bursitis.

## 2020-12-18 MED ORDER — GADOBUTROL 1 MMOL/ML IV SOLN
1.0000 mL | Freq: Once | INTRAVENOUS | Status: AC | PRN
  Administered 2020-12-18: 17:00:00 1 mL

## 2020-12-18 NOTE — Progress Notes (Signed)
    Procedures performed today:    Procedure: Real-time Ultrasound Guided gadolinium contrast injection of right hip joint Device: Samsung HS60  Verbal informed consent obtained.  Time-out conducted.  Noted no overlying erythema, induration, or other signs of local infection.  Skin prepped in a sterile fashion.  Local anesthesia: Topical Ethyl chloride.  With sterile technique and under real time ultrasound guidance: Noted a bit of arthritic changes in the hip joint, I advanced a 22-gauge spinal needle to the femoral head/neck junction, contacted bone and then injected 1 cc kenalog 40, 2 cc lidocaine, 2 cc bupivacaine, syringe again switched and 0.1 cc gadolinium injected, syringe again switched to 10 cc sterile saline used to fully distend the joint. Joint visualized and capsule seen distending confirming intra-articular placement of contrast material and medication. Completed without difficulty  Advised to call if fevers/chills, erythema, induration, drainage, or persistent bleeding.  Images permanently stored in PACS Impression: Technically successful ultrasound guided gadolinium contrast injection for MR arthrography.  Please see separate MR arthrogram report.  Independent interpretation of notes and tests performed by another provider:   None.  Brief History, Exam, Impression, and Recommendations:    Right hip pain Chronic right hip pain, injection for MR arthrography, further management per primary treating provider.    ___________________________________________ Ihor Austin. Benjamin Stain, M.D., ABFM., CAQSM. Primary Care and Sports Medicine [facility]  Adjunct Instructor of Family Medicine  [employer]

## 2020-12-18 NOTE — Assessment & Plan Note (Signed)
Chronic right hip pain, injection for MR arthrography, further management per primary treating provider.

## 2020-12-20 NOTE — Progress Notes (Signed)
Hip MRI arthrogram shows mild arthritis. The main finding however is a labrum tear of the right hip which is probably causing your pain. You do have a little bit of tendinitis on the lateral side of the hip. Recommend return to clinic to go over the results in full detail and discuss treatment plan and options.

## 2020-12-21 ENCOUNTER — Ambulatory Visit: Payer: BC Managed Care – PPO | Admitting: Family Medicine

## 2020-12-21 ENCOUNTER — Encounter: Payer: Self-pay | Admitting: Family Medicine

## 2020-12-21 ENCOUNTER — Other Ambulatory Visit: Payer: Self-pay

## 2020-12-21 VITALS — BP 148/84 | HR 74 | Ht 60.0 in | Wt 137.8 lb

## 2020-12-21 DIAGNOSIS — R1031 Right lower quadrant pain: Secondary | ICD-10-CM | POA: Diagnosis not present

## 2020-12-21 DIAGNOSIS — M24151 Other articular cartilage disorders, right hip: Secondary | ICD-10-CM

## 2020-12-21 DIAGNOSIS — M25551 Pain in right hip: Secondary | ICD-10-CM | POA: Diagnosis not present

## 2020-12-21 NOTE — Patient Instructions (Addendum)
Good to see you today.  I've referred you to Dr. Caswell Corwin.  Their office will call you to schedule.  Please call [facility] to get a copy of your MRI: [phone].  Follow-up: as needed

## 2020-12-21 NOTE — Progress Notes (Signed)
I, Wendy Poet, LAT, ATC, am serving as scribe for Dr. Lynne Leader.  Monica Reed is a 50 y.o. female who presents to [facility] today for f/u R anterior hip to groin pain and MRI arthrogram. Pt was last seen by Dr. Georgina Snell on 11/20/20 and was referred to [facility]. Pt called the office the next day, 11/1, c/o R hip pain significantly limiting her ability to work and ADL and would like to proceed to MRI arthrogram. Today, pt reports that her R hip pain remains the same.   She has not yet done any PT.  Dx imaging: 12/18/20 R hip MRI arthrogram 10/26/20 R hip & L-spine XR             12/13/19 Pelvis MRI  Pertinent review of systems: No fevers or chills  Relevant historical information: Depression   Exam:  BP (!) 148/84 (BP Location: Left Arm, Patient Position: Sitting, Cuff Size: Normal)   Pulse 74   Ht 5' (1.524 m)   Wt 137 lb 12.8 oz (62.5 kg)   SpO2 98%   BMI 26.91 kg/m  General: Well Developed, well nourished, and in no acute distress.   MSK: Right hip normal-appearing decreased motion pain with flexion and internal rotation.    Lab and Radiology Results No results found for this or any previous visit (from the past 72 hour(s)). MR HIP RIGHT W CONTRAST  Result Date: 12/19/2020 CLINICAL DATA:  Right hip pain. EXAM: MRI OF THE RIGHT HIP WITH CONTRAST TECHNIQUE: Multiplanar, multisequence MR imaging was performed following the administration of intravenous contrast. CONTRAST:  59mL GADAVIST GADOBUTROL 1 MMOL/ML IV SOLN COMPARISON:  Radiographs 10/26/2020 FINDINGS: Both hips are normally located. No stress fracture or AVN. Mild degenerative changes bilaterally. Mild degenerative chondrosis without full-thickness cartilage defect. No morphologic findings for femoroacetabular impingement. There is a superior anterior labral tear involving the right hip. The pubic symphysis and SI joints are intact. No bone lesions or fractures. The hip and  pelvic musculature are unremarkable. No muscle tear, myositis or mass. Mild bilateral peritendinosis but no findings for trochanteric bursitis. The hamstring tendons are intact. No significant intrapelvic abnormalities are identified. IMPRESSION: 1. Mild bilateral hip joint degenerative changes but no stress fracture or AVN. 2. Superior anterior labral tear involving the right hip. 3. Mild bilateral peritendinosis but no findings for trochanteric bursitis. Electronically Signed   By: Marijo Sanes M.D.   On: 12/19/2020 16:16   [place] COMPLETE JOINT SPACE STRUCTURES LOW RIGHT  Result Date: 12/19/2020 Procedure: Real-time Ultrasound Guided gadolinium contrast injection of right hip joint Device: Samsung HS60 Verbal informed consent obtained. Time-out conducted. Noted no overlying erythema, induration, or other signs of local infection. Skin prepped in a sterile fashion. Local anesthesia: Topical Ethyl chloride. With sterile technique and under real time ultrasound guidance: Noted a bit of arthritic changes in the hip joint, I advanced a 22-gauge spinal needle to the femoral head/neck junction, contacted bone and then injected 1 cc kenalog 40, 2 cc lidocaine, 2 cc bupivacaine, syringe again switched and 0.1 cc gadolinium injected, syringe again switched to 10 cc sterile saline used to fully distend the joint. Joint visualized and capsule seen distending confirming intra-articular placement of contrast material and medication. Completed without difficulty Advised to call if fevers/chills, erythema, induration, drainage, or persistent bleeding. Images permanently stored in PACS Impression: Technically successful ultrasound guided gadolinium contrast injection for MR arthrography.  Please see separate MR arthrogram report.  I, [name], personally (independently) visualized and performed the interpretation of the MRI images attached in this note.   Assessment and Plan: 50 y.o. female with right anterior hip  and groin pain due to anterior labrum tear thought to be potential impingement.  Patient is not doing well with attempt of conservative management.  She did have good temporary relief with the lidocaine/Marcaine component of the intra-articular injection indicating that her pain is generated from the hip joint.  At this point she may be a surgical candidate and given the minimal degenerative changes she may benefit from a hip scope.  Plan to refer to Dr. Caswell Corwin for evaluation potential hip scope versus total hip replacement.  Discussed surgical options with patient who expresses understanding and agreement.  Recheck back with me as needed.   PDMP not reviewed this encounter. Orders Placed This Encounter  Procedures   Ambulatory referral to Orthopedic Surgery    Referral Priority:   Routine    Referral Type:   Surgical    Referral Reason:   Specialty Services Required    Requested Specialty:   Orthopedic Surgery    Number of Visits Requested:   1   No orders of the defined types were placed in this encounter.    Discussed warning signs or symptoms. Please see discharge instructions. Patient expresses understanding.   The above documentation has been reviewed and is accurate and complete [name], M.D.  Total encounter time 20 minutes including face-to-face time with the patient and, reviewing past medical record, and charting on the date of service.   MRI findings treatment plan and options

## 2021-03-06 DIAGNOSIS — M25551 Pain in right hip: Secondary | ICD-10-CM | POA: Diagnosis not present

## 2021-03-12 ENCOUNTER — Other Ambulatory Visit: Payer: Self-pay

## 2021-03-12 ENCOUNTER — Ambulatory Visit: Payer: BC Managed Care – PPO | Admitting: Nurse Practitioner

## 2021-03-12 ENCOUNTER — Encounter: Payer: Self-pay | Admitting: Nurse Practitioner

## 2021-03-12 VITALS — BP 129/78 | HR 86 | Temp 86.0°F | Ht 60.0 in | Wt 145.0 lb

## 2021-03-12 DIAGNOSIS — Z6828 Body mass index (BMI) 28.0-28.9, adult: Secondary | ICD-10-CM

## 2021-03-12 DIAGNOSIS — I10 Essential (primary) hypertension: Secondary | ICD-10-CM | POA: Diagnosis not present

## 2021-03-12 DIAGNOSIS — F330 Major depressive disorder, recurrent, mild: Secondary | ICD-10-CM | POA: Diagnosis not present

## 2021-03-12 DIAGNOSIS — Z01818 Encounter for other preprocedural examination: Secondary | ICD-10-CM

## 2021-03-12 DIAGNOSIS — Z6826 Body mass index (BMI) 26.0-26.9, adult: Secondary | ICD-10-CM

## 2021-03-12 DIAGNOSIS — E7800 Pure hypercholesterolemia, unspecified: Secondary | ICD-10-CM

## 2021-03-12 MED ORDER — FLUOXETINE HCL 20 MG PO CAPS: ORAL_CAPSULE | ORAL | 1 refills | Status: DC

## 2021-03-12 MED ORDER — ROSUVASTATIN CALCIUM 5 MG PO TABS: 5.0000 mg | ORAL_TABLET | Freq: Every day | ORAL | 1 refills | Status: DC

## 2021-03-12 MED ORDER — LISINOPRIL-HYDROCHLOROTHIAZIDE 20-12.5 MG PO TABS: 1.0000 | ORAL_TABLET | Freq: Every day | ORAL | 1 refills | Status: DC

## 2021-03-12 MED ORDER — PHENTERMINE HCL 37.5 MG PO TABS: 37.5000 mg | ORAL_TABLET | Freq: Every day | ORAL | 0 refills | Status: DC

## 2021-03-12 NOTE — Progress Notes (Signed)
Established patient visit   Patient: Monica Reed   DOB: [birth year]   51 y.o. Female  MRN: [id number] Visit Date: 03/12/2021   No chief complaint on file.  Subjective    HPI  The patient presents for surgical clearance. She is scheduled to have have repair of labral tear of the right hip and removal of bone fragments. Surgery is scheduled for 04/12/2021. She currently has prescription for phentermine. Has been taking phentermine off and on. Has been off due to the pain she was experiencing in her hip. Now that she has some pain medication, she is able to move around and exercise a little more. She has not experienced negative side effects from taking phentermine. Would like to restart it until time of surgery so that healing time might be shorter and restarting normal activities may take less time. She has had an 8 pound weight gain since she was last seen. She has been off phentermine.    Medications: Outpatient Medications Prior to Visit  Medication Sig   loratadine (CLARITIN) 10 MG tablet Take 10 mg by mouth daily.   norethindrone-ethinyl estradiol (LOESTRIN FE) 1-20 MG-MCG tablet Take 1 tablet by mouth daily.   [DISCONTINUED] FLUoxetine (PROZAC) 20 MG capsule TAKE 3 CAPSULES BY MOUTH EVERY DAY   [DISCONTINUED] gabapentin (NEURONTIN) 100 MG capsule TAKE 1 CAPSULE BY MOUTH IN THE EVENING AS NEEDED . MAY INCREASE TO 1 CAP TWICE A DAY AS TOLERATED   [DISCONTINUED] lisinopril-hydrochlorothiazide (ZESTORETIC) 20-12.5 MG tablet Take 1 tablet by mouth daily.   [DISCONTINUED] phentermine (ADIPEX-P) 37.5 MG tablet Take 1 tablet (37.5 mg total) by mouth daily before breakfast.   [DISCONTINUED] rosuvastatin (CRESTOR) 5 MG tablet TAKE 1 TABLET (5 MG TOTAL) BY MOUTH DAILY.   No facility-administered medications prior to visit.    Review of Systems  Constitutional:  Negative for activity change, appetite change, chills, fatigue and fever.       Eight pound weight gain since most recent  visit.   HENT:  Negative for congestion, postnasal drip, rhinorrhea, sinus pressure, sinus pain, sneezing and sore throat.   Eyes: Negative.   Respiratory:  Negative for cough, chest tightness, shortness of breath and wheezing.   Cardiovascular:  Negative for chest pain and palpitations.  Gastrointestinal:  Negative for abdominal pain, constipation, diarrhea, nausea and vomiting.  Endocrine: Negative for cold intolerance, heat intolerance, polydipsia and polyuria.  Genitourinary:  Negative for dyspareunia, dysuria, flank pain, frequency and urgency.  Musculoskeletal:  Positive for arthralgias. Negative for back pain and myalgias.       Right hip pain   Skin:  Negative for rash.  Allergic/Immunologic: Negative for environmental allergies.  Neurological:  Negative for dizziness, weakness and headaches.  Hematological:  Negative for adenopathy.  Psychiatric/Behavioral:  The patient is not nervous/anxious.       Objective   Today's Vitals   03/12/21 1449  BP: 129/78  Pulse: 86  Temp: (!) 86 F (30 C)  SpO2: 97%  Weight: 145 lb (65.8 kg)  Height: 5' (1.524 m)   Body mass index is 28.32 kg/m.   BP Readings from Last 3 Encounters:  03/12/21 129/78  12/21/20 (!) 148/84  11/20/20 134/84    Wt Readings from Last 3 Encounters:  03/12/21 145 lb (65.8 kg)  12/21/20 137 lb 12.8 oz (62.5 kg)  11/20/20 137 lb 6.4 oz (62.3 kg)    Physical Exam Vitals and nursing note reviewed.  Constitutional:      Appearance: Normal appearance. She is well-developed.  HENT:     Head: Normocephalic and atraumatic.     Nose: Nose normal.     Mouth/Throat:     Mouth: Mucous membranes are moist.     Pharynx: Oropharynx is clear.  Eyes:     Extraocular Movements: Extraocular movements intact.     Conjunctiva/sclera: Conjunctivae normal.     Pupils: Pupils are equal, round, and reactive to light.  Cardiovascular:     Rate and Rhythm: Normal rate and regular rhythm.     Pulses: Normal pulses.      Heart sounds: Normal heart sounds.     Comments: EKG done during today's visit is within normal limits. Pulmonary:     Effort: Pulmonary effort is normal.     Breath sounds: Normal breath sounds.  Abdominal:     General: Bowel sounds are normal. There is no distension.     Palpations: Abdomen is soft. There is no mass.     Tenderness: There is no abdominal tenderness. There is no guarding or rebound.     Hernia: No hernia is present.  Musculoskeletal:        General: Normal range of motion.     Cervical back: Normal range of motion and neck supple.  Lymphadenopathy:     Cervical: No cervical adenopathy.  Skin:    General: Skin is warm and dry.     Capillary Refill: Capillary refill takes less than 2 seconds.  Neurological:     General: No focal deficit present.     Mental Status: She is alert and oriented to person, place, and time.  Psychiatric:        Mood and Affect: Mood normal.        Behavior: Behavior normal.        Thought Content: Thought content normal.        Judgment: Judgment normal.     Results for orders placed or performed in visit on 43/32/95  Basic Metabolic Panel (BMET)  Result Value Ref Range   Glucose 67 (L) 70 - 99 mg/dL   BUN 12 6 - 24 mg/dL   Creatinine, Ser 0.67 0.57 - 1.00 mg/dL   eGFR 106 >59 mL/min/1.73   BUN/Creatinine Ratio 18 9 - 23   Sodium 137 134 - 144 mmol/L   Potassium 3.7 3.5 - 5.2 mmol/L   Chloride 101 96 - 106 mmol/L   CO2 22 20 - 29 mmol/L   Calcium 8.7 8.7 - 10.2 mg/dL  CBC  Result Value Ref Range   WBC 9.0 3.4 - 10.8 x10E3/uL   RBC 4.38 3.77 - 5.28 x10E6/uL   Hemoglobin 13.9 11.1 - 15.9 g/dL   Hematocrit 41.0 34.0 - 46.6 %   MCV 94 79 - 97 fL   MCH 31.7 26.6 - 33.0 pg   MCHC 33.9 31.5 - 35.7 g/dL   RDW 12.1 11.7 - 15.4 %   Platelets 320 150 - 450 x10E3/uL    Assessment & Plan    1. Pre-procedural examination The patient presents for surgical clearance to have repair of right hip arthroscopic surgery.  EKG is normal.  We  will draw CBC and BMP.  Patient is cleared for surgery with low risk pending lab results. - Basic Metabolic Panel (BMET); Future - CBC; Future - Basic Metabolic Panel (BMET) - CBC - EKG 12-Lead  2. Hypertension, unspecified type Stable.  Continue losartan/hydrochlorothiazide 20/12.5 mg tablets daily.  Refills provided today. - lisinopril-hydrochlorothiazide (ZESTORETIC) 20-12.5 MG tablet; Take 1 tablet by mouth daily.  Dispense: 90 tablet; Refill: 1  3. Mild recurrent major depression (HCC) Stable.  Continue fluoxetine 60 mg daily.  Refills provided today. - FLUoxetine (PROZAC) 20 MG capsule; TAKE 3 CAPSULES BY MOUTH EVERY DAY  Dispense: 270 capsule; Refill: 1  4. BMI 28.0-28.9,adult Discussed lowering calorie intake to 1500 calories per day and incorporating exercise into daily routine to help lose weight.  Patient may take phentermine 37.5 mg tablets up until 1 week prior to surgery.  At this point, she should stop medication.  Resume after release to resume seminormal activity. - phentermine (ADIPEX-P) 37.5 MG tablet; Take 1 tablet (37.5 mg total) by mouth daily before breakfast.  Dispense: 30 tablet; Refill: 0  5. Elevated LDL cholesterol level Continue Crestor 5 mg tablets daily. - rosuvastatin (CRESTOR) 5 MG tablet; Take 1 tablet (5 mg total) by mouth daily.  Dispense: 90 tablet; Refill: 1    Problem List Items Addressed This Visit       Cardiovascular and Mediastinum   Hypertension   Relevant Medications   lisinopril-hydrochlorothiazide (ZESTORETIC) 20-12.5 MG tablet   rosuvastatin (CRESTOR) 5 MG tablet     Other   Elevated LDL cholesterol level (Chronic)   Relevant Medications   rosuvastatin (CRESTOR) 5 MG tablet   BMI 26.0-26.9,adult   Relevant Medications   phentermine (ADIPEX-P) 37.5 MG tablet   Mild recurrent major depression (HCC)   Relevant Medications   FLUoxetine (PROZAC) 20 MG capsule   Other Visit Diagnoses     Pre-procedural examination    -  Primary    Relevant Orders   Basic Metabolic Panel (BMET) (Completed)   CBC (Completed)   EKG 12-Lead (Completed)        Return in about 4 months (around 07/10/2021) for health maintenance exam - see below.         Ronnell Freshwater, NP  [facility] at [facility] [phone] (phone) [phone] (fax)  [place]

## 2021-03-13 LAB — BASIC METABOLIC PANEL
BUN/Creatinine Ratio: 18 (ref 9–23)
BUN: 12 mg/dL (ref 6–24)
CO2: 22 mmol/L (ref 20–29)
Calcium: 8.7 mg/dL (ref 8.7–10.2)
Chloride: 101 mmol/L (ref 96–106)
Creatinine, Ser: 0.67 mg/dL (ref 0.57–1.00)
Glucose: 67 mg/dL — ABNORMAL LOW (ref 70–99)
Potassium: 3.7 mmol/L (ref 3.5–5.2)
Sodium: 137 mmol/L (ref 134–144)
eGFR: 106 mL/min/{1.73_m2} (ref >59)

## 2021-03-13 LAB — CBC
Hematocrit: 41 % (ref 34.0–46.6)
Hemoglobin: 13.9 g/dL (ref 11.1–15.9)
MCH: 31.7 pg (ref 26.6–33.0)
MCHC: 33.9 g/dL (ref 31.5–35.7)
MCV: 94 fL (ref 79–97)
Platelets: 320 10*3/uL (ref 150–450)
RBC: 4.38 x10E6/uL (ref 3.77–5.28)
RDW: 12.1 % (ref 11.7–15.4)
WBC: 9 10*3/uL (ref 3.4–10.8)

## 2021-03-15 ENCOUNTER — Encounter: Payer: Self-pay | Admitting: Nurse Practitioner

## 2021-03-18 NOTE — Patient Instructions (Signed)
Fat and Cholesterol Restricted Eating Plan Getting too much fat and cholesterol in your diet may cause health problems. Choosing the right foods helps keep your fat and cholesterol at normal levels. This can keep you from getting certain diseases. Your doctor may recommend an eating plan that includes: Total fat: ______% or less of total calories a day. This is ______g of fat a day. Saturated fat: ______% or less of total calories a day. This is ______g of saturated fat a day. Cholesterol: less than _________mg a day. Fiber: ______g a day. What are tips for following this plan? General tips Work with your doctor to lose weight if you need to. Avoid: Foods with added sugar. Fried foods. Foods with trans fat or partially hydrogenated oils. This includes some margarines and baked goods. If you drink alcohol: Limit how much you have to: 0-1 drink a day for women who are not pregnant. 0-2 drinks a day for men. Know how much alcohol is in a drink. In the [place] one drink equals one 12 oz bottle of beer (355 mL), one 5 oz glass of wine (148 mL), or one 1 oz glass of hard liquor (44 mL). Reading food labels Check food labels for: Trans fats. Partially hydrogenated oils. Saturated fat (g) in each serving. Cholesterol (mg) in each serving. Fiber (g) in each serving. Choose foods with healthy fats, such as: Monounsaturated fats and polyunsaturated fats. These include olive and canola oil, flaxseeds, walnuts, almonds, and seeds. Omega-3 fats. These are found in certain fish, flaxseed oil, and ground flaxseeds. Choose grain products that have whole grains. Look for the word "whole" as the first word in the ingredient list. Cooking Cook foods using low-fat methods. These include baking, boiling, grilling, and broiling. Eat more home-cooked foods. Eat at restaurants and buffets less often. Eat less fast food. Avoid cooking using saturated fats, such as butter, cream, palm oil, palm kernel oil, and  coconut oil. Meal planning  At meals, divide your plate into four equal parts: Fill one-half of your plate with vegetables, green salads, and fruit. Fill one-fourth of your plate with whole grains. Fill one-fourth of your plate with low-fat (lean) protein foods. Eat fish that is high in omega-3 fats at least two times a week. This includes mackerel, tuna, sardines, and salmon. Eat foods that are high in fiber, such as whole grains, beans, apples, pears, berries, broccoli, carrots, peas, and barley. What foods should I eat? Fruits All fresh, canned (in natural juice), or frozen fruits. Vegetables Fresh or frozen vegetables (raw, steamed, roasted, or grilled). Green salads. Grains Whole grains, such as whole wheat or whole grain breads, crackers, cereals, and pasta. Unsweetened oatmeal, bulgur, barley, quinoa, or brown rice. Corn or whole wheat flour tortillas. Meats and other protein foods Ground beef (85% or leaner), grass-fed beef, or beef trimmed of fat. Skinless chicken or turkey. Ground chicken or turkey. Pork trimmed of fat. All fish and seafood. Egg whites. Dried beans, peas, or lentils. Unsalted nuts or seeds. Unsalted canned beans. Nut butters without added sugar or oil. Dairy Low-fat or nonfat dairy products, such as skim or 1% milk, 2% or reduced-fat cheeses, low-fat and fat-free ricotta or cottage cheese, or plain low-fat and nonfat yogurt. Fats and oils Tub margarine without trans fats. Light or reduced-fat mayonnaise and salad dressings. Avocado. Olive, canola, sesame, or safflower oils. The items listed above may not be a complete list of foods and beverages you can eat. Contact a dietitian for more information. What foods   should I avoid? Fruits Canned fruit in heavy syrup. Fruit in cream or butter sauce. Fried fruit. Vegetables Vegetables cooked in cheese, cream, or butter sauce. Fried vegetables. Grains White bread. White pasta. White rice. Cornbread. Bagels, pastries,  and croissants. Crackers and snack foods that contain trans fat and hydrogenated oils. Meats and other protein foods Fatty cuts of meat. Ribs, chicken wings, bacon, sausage, bologna, salami, chitterlings, fatback, hot dogs, bratwurst, and packaged lunch meats. Liver and organ meats. Whole eggs and egg yolks. Chicken and turkey with skin. Fried meat. Dairy Whole or 2% milk, cream, half-and-half, and cream cheese. Whole milk cheeses. Whole-fat or sweetened yogurt. Full-fat cheeses. Nondairy creamers and whipped toppings. Processed cheese, cheese spreads, and cheese curds. Fats and oils Butter, stick margarine, lard, shortening, ghee, or bacon fat. Coconut, palm kernel, and palm oils. Beverages Alcohol. Sugar-sweetened drinks such as sodas, lemonade, and fruit drinks. Sweets and desserts Corn syrup, sugars, honey, and molasses. Candy. Jam and jelly. Syrup. Sweetened cereals. Cookies, pies, cakes, donuts, muffins, and ice cream. The items listed above may not be a complete list of foods and beverages you should avoid. Contact a dietitian for more information. Summary Choosing the right foods helps keep your fat and cholesterol at normal levels. This can keep you from getting certain diseases. At meals, fill one-half of your plate with vegetables, green salads, and fruits. Eat high fiber foods, like whole grains, beans, apples, pears, berries, carrots, peas, and barley. Limit added sugar, saturated fats, alcohol, and fried foods. This information is not intended to replace advice given to you by your health care provider. Make sure you discuss any questions you have with your health care provider. Document Revised: 05/19/2020 Document Reviewed: 05/19/2020 Elsevier Patient Education  2022 Elsevier Inc.  

## 2021-04-06 DIAGNOSIS — M25551 Pain in right hip: Secondary | ICD-10-CM | POA: Diagnosis not present

## 2021-04-06 DIAGNOSIS — M25651 Stiffness of right hip, not elsewhere classified: Secondary | ICD-10-CM | POA: Diagnosis not present

## 2021-04-06 DIAGNOSIS — R29898 Other symptoms and signs involving the musculoskeletal system: Secondary | ICD-10-CM | POA: Diagnosis not present

## 2021-04-18 DIAGNOSIS — M25551 Pain in right hip: Secondary | ICD-10-CM | POA: Diagnosis not present

## 2021-04-19 DIAGNOSIS — M84352A Stress fracture, left femur, initial encounter for fracture: Secondary | ICD-10-CM | POA: Diagnosis not present

## 2021-04-19 DIAGNOSIS — M25851 Other specified joint disorders, right hip: Secondary | ICD-10-CM | POA: Diagnosis not present

## 2021-04-19 DIAGNOSIS — M6588 Other synovitis and tenosynovitis, other site: Secondary | ICD-10-CM | POA: Diagnosis not present

## 2021-04-19 DIAGNOSIS — X58XXXA Exposure to other specified factors, initial encounter: Secondary | ICD-10-CM | POA: Diagnosis not present

## 2021-04-19 DIAGNOSIS — M24851 Other specific joint derangements of right hip, not elsewhere classified: Secondary | ICD-10-CM | POA: Diagnosis not present

## 2021-04-19 DIAGNOSIS — M65851 Other synovitis and tenosynovitis, right thigh: Secondary | ICD-10-CM | POA: Diagnosis not present

## 2021-04-19 DIAGNOSIS — M25551 Pain in right hip: Secondary | ICD-10-CM | POA: Diagnosis not present

## 2021-04-19 DIAGNOSIS — M94251 Chondromalacia, right hip: Secondary | ICD-10-CM | POA: Diagnosis not present

## 2021-04-19 DIAGNOSIS — S73111A Iliofemoral ligament sprain of right hip, initial encounter: Secondary | ICD-10-CM | POA: Diagnosis not present

## 2021-04-19 DIAGNOSIS — S73191A Other sprain of right hip, initial encounter: Secondary | ICD-10-CM | POA: Diagnosis not present

## 2021-04-20 DIAGNOSIS — S73101D Unspecified sprain of right hip, subsequent encounter: Secondary | ICD-10-CM | POA: Diagnosis not present

## 2021-04-23 DIAGNOSIS — M25651 Stiffness of right hip, not elsewhere classified: Secondary | ICD-10-CM | POA: Diagnosis not present

## 2021-04-23 DIAGNOSIS — R29898 Other symptoms and signs involving the musculoskeletal system: Secondary | ICD-10-CM | POA: Diagnosis not present

## 2021-04-23 DIAGNOSIS — Z4789 Encounter for other orthopedic aftercare: Secondary | ICD-10-CM | POA: Diagnosis not present

## 2021-04-23 DIAGNOSIS — M25551 Pain in right hip: Secondary | ICD-10-CM | POA: Diagnosis not present

## 2021-04-25 DIAGNOSIS — Z4789 Encounter for other orthopedic aftercare: Secondary | ICD-10-CM | POA: Diagnosis not present

## 2021-04-25 DIAGNOSIS — M25651 Stiffness of right hip, not elsewhere classified: Secondary | ICD-10-CM | POA: Diagnosis not present

## 2021-04-25 DIAGNOSIS — R29898 Other symptoms and signs involving the musculoskeletal system: Secondary | ICD-10-CM | POA: Diagnosis not present

## 2021-04-25 DIAGNOSIS — M25551 Pain in right hip: Secondary | ICD-10-CM | POA: Diagnosis not present

## 2021-04-27 DIAGNOSIS — M25651 Stiffness of right hip, not elsewhere classified: Secondary | ICD-10-CM | POA: Diagnosis not present

## 2021-04-27 DIAGNOSIS — M25551 Pain in right hip: Secondary | ICD-10-CM | POA: Diagnosis not present

## 2021-04-27 DIAGNOSIS — Z4789 Encounter for other orthopedic aftercare: Secondary | ICD-10-CM | POA: Diagnosis not present

## 2021-04-27 DIAGNOSIS — R29898 Other symptoms and signs involving the musculoskeletal system: Secondary | ICD-10-CM | POA: Diagnosis not present

## 2021-04-30 DIAGNOSIS — M25651 Stiffness of right hip, not elsewhere classified: Secondary | ICD-10-CM | POA: Diagnosis not present

## 2021-04-30 DIAGNOSIS — M25551 Pain in right hip: Secondary | ICD-10-CM | POA: Diagnosis not present

## 2021-04-30 DIAGNOSIS — Z4789 Encounter for other orthopedic aftercare: Secondary | ICD-10-CM | POA: Diagnosis not present

## 2021-04-30 DIAGNOSIS — R29898 Other symptoms and signs involving the musculoskeletal system: Secondary | ICD-10-CM | POA: Diagnosis not present

## 2021-05-02 DIAGNOSIS — Z4789 Encounter for other orthopedic aftercare: Secondary | ICD-10-CM | POA: Diagnosis not present

## 2021-05-02 DIAGNOSIS — M25551 Pain in right hip: Secondary | ICD-10-CM | POA: Diagnosis not present

## 2021-05-02 DIAGNOSIS — M25651 Stiffness of right hip, not elsewhere classified: Secondary | ICD-10-CM | POA: Diagnosis not present

## 2021-05-02 DIAGNOSIS — R29898 Other symptoms and signs involving the musculoskeletal system: Secondary | ICD-10-CM | POA: Diagnosis not present

## 2021-05-07 DIAGNOSIS — M25551 Pain in right hip: Secondary | ICD-10-CM | POA: Diagnosis not present

## 2021-05-07 DIAGNOSIS — Z4789 Encounter for other orthopedic aftercare: Secondary | ICD-10-CM | POA: Diagnosis not present

## 2021-05-07 DIAGNOSIS — R29898 Other symptoms and signs involving the musculoskeletal system: Secondary | ICD-10-CM | POA: Diagnosis not present

## 2021-05-07 DIAGNOSIS — M25651 Stiffness of right hip, not elsewhere classified: Secondary | ICD-10-CM | POA: Diagnosis not present

## 2021-05-09 ENCOUNTER — Ambulatory Visit: Payer: BC Managed Care – PPO | Admitting: Physician Assistant

## 2021-05-09 DIAGNOSIS — Z4789 Encounter for other orthopedic aftercare: Secondary | ICD-10-CM | POA: Diagnosis not present

## 2021-05-09 DIAGNOSIS — M25551 Pain in right hip: Secondary | ICD-10-CM | POA: Diagnosis not present

## 2021-05-09 DIAGNOSIS — M25651 Stiffness of right hip, not elsewhere classified: Secondary | ICD-10-CM | POA: Diagnosis not present

## 2021-05-09 DIAGNOSIS — R29898 Other symptoms and signs involving the musculoskeletal system: Secondary | ICD-10-CM | POA: Diagnosis not present

## 2021-05-09 NOTE — Progress Notes (Deleted)
  Established patient acute visit   Patient: Monica Reed   DOB: November 10, 1970   51 y.o. Female  MRN: [id number] Visit Date: 05/09/2021  No chief complaint on file.  Subjective    HPI  ***    Medications: Outpatient Medications Prior to Visit  Medication Sig   FLUoxetine (PROZAC) 20 MG capsule TAKE 3 CAPSULES BY MOUTH EVERY DAY   lisinopril-hydrochlorothiazide (ZESTORETIC) 20-12.5 MG tablet Take 1 tablet by mouth daily.   loratadine (CLARITIN) 10 MG tablet Take 10 mg by mouth daily.   norethindrone-ethinyl estradiol (LOESTRIN FE) 1-20 MG-MCG tablet Take 1 tablet by mouth daily.   phentermine (ADIPEX-P) 37.5 MG tablet Take 1 tablet (37.5 mg total) by mouth daily before breakfast.   rosuvastatin (CRESTOR) 5 MG tablet Take 1 tablet (5 mg total) by mouth daily.   No facility-administered medications prior to visit.    Review of Systems  {Labs  Heme  Chem  Endocrine  Serology  Results Review (optional):[id number]}   Objective    There were no vitals taken for this visit.   Physical Exam  ***  No results found for any visits on 05/09/21.  Assessment & Plan     ***   No follow-ups on file.        Lorrene Reid, PA-C  [facility] at [facility] [phone] (phone) [phone] (fax)  [place]

## 2021-05-10 ENCOUNTER — Ambulatory Visit (INDEPENDENT_AMBULATORY_CARE_PROVIDER_SITE_OTHER): Payer: BC Managed Care – PPO | Admitting: Physician Assistant

## 2021-05-10 ENCOUNTER — Encounter: Payer: Self-pay | Admitting: Physician Assistant

## 2021-05-10 VITALS — BP 134/83 | HR 80 | Temp 98.1°F | Ht 60.0 in | Wt 145.0 lb

## 2021-05-10 DIAGNOSIS — M25511 Pain in right shoulder: Secondary | ICD-10-CM

## 2021-05-10 DIAGNOSIS — E7800 Pure hypercholesterolemia, unspecified: Secondary | ICD-10-CM | POA: Insufficient documentation

## 2021-05-10 DIAGNOSIS — F32A Depression, unspecified: Secondary | ICD-10-CM | POA: Insufficient documentation

## 2021-05-10 MED ORDER — METHYLPREDNISOLONE 4 MG PO TBPK: ORAL_TABLET | ORAL | 0 refills | Status: DC

## 2021-05-10 MED ORDER — METHOCARBAMOL 500 MG PO TABS: 500.0000 mg | ORAL_TABLET | Freq: Three times a day (TID) | ORAL | 0 refills | Status: DC | PRN

## 2021-05-10 NOTE — Patient Instructions (Signed)

## 2021-05-10 NOTE — Progress Notes (Signed)
?  Established patient acute visit ? ? ?Patient: Monica Reed   DOB: 08/29/1970   51 y.o. Female  MRN: [id number] ?Visit Date: 05/10/2021 ? ?Chief Complaint  ?Patient presents with  ? Acute Visit  ? ?Subjective  ?  ?HPI  ?Patient presents with right shoulder pain which feels like it is coming from her shoulder blade. Has a tingling sensation down her arm and hands. States bending her neck can trigger shoulder pain and tingling sensation. No injury or fall. Symptoms started around March 15th which is around when she had her right hip surgery. Has been using crutches since then.  ? ? ? ? ?Medications: ?Outpatient Medications Prior to Visit  ?Medication Sig  ? doxycycline (VIBRAMYCIN) 100 MG capsule Take 100 mg by mouth 2 (two) times daily.  ? FLUoxetine (PROZAC) 20 MG capsule TAKE 3 CAPSULES BY MOUTH EVERY DAY  ? lisinopril-hydrochlorothiazide (ZESTORETIC) 20-12.5 MG tablet Take 1 tablet by mouth daily.  ? loratadine (CLARITIN) 10 MG tablet Take 10 mg by mouth daily.  ? norethindrone-ethinyl estradiol (LOESTRIN FE) 1-20 MG-MCG tablet Take 1 tablet by mouth daily.  ? phentermine (ADIPEX-P) 37.5 MG tablet Take 1 tablet (37.5 mg total) by mouth daily before breakfast.  ? rosuvastatin (CRESTOR) 5 MG tablet Take 1 tablet (5 mg total) by mouth daily.  ? ?No facility-administered medications prior to visit.  ? ? ?Review of Systems ?Review of Systems:  ?A fourteen system review of systems was performed and found to be positive as per HPI. ? ? ?  Objective  ?  ?BP 134/83   Pulse 80   Temp 98.1 ?F (36.7 ?C)   Ht 5' (1.524 m)   Wt 145 lb (65.8 kg)   SpO2 98%   BMI 28.32 kg/m?  ? ? ?Physical Exam  ?General:  Well Developed, well nourished, appropriate for stated age.  ?Neuro:  Alert and oriented,  extra-ocular muscles intact  ?HEENT:  Normocephalic, atraumatic, neck supple  ?Skin:  no gross rash, warm, pink. ?Cardiac:  RRR, S1 S2 ?Respiratory: CTA B/L  ?MSK: no c-spine tenderness, tingling sensation with palpation of  c-spine and right scapula, good cervical and shoulder ROM  ?Vascular:  Ext warm, no cyanosis apprec.; cap RF less 2 sec. ?Psych:  No HI/SI, judgement and insight good, Euthymic mood. Full Affect. ? ? ?No results found for any visits on 05/10/21. ? Assessment & Plan  ?  ? ?Discussed with patient possibly glenohumeral etiology from overuse secondary to recent use of crutches. Will start corticosteroid therapy and a muscle relaxer. Recommend to continue neck exercises recommended by PT. Will reassess symptoms and medication therapy in 2 weeks. If symptoms fail to improve or worsen recommend further evaluation with imaging studies (shoulder and cervical xray's). ? ? ?Return in 2 weeks (on 05/24/2021) for shoulder pain.  ?   ? ? ? ?Lorrene Reid, PA-C  ?[facility] ?[phone] (phone) ?[phone] (fax) ? ?[facility] ?

## 2021-05-11 DIAGNOSIS — M25551 Pain in right hip: Secondary | ICD-10-CM | POA: Diagnosis not present

## 2021-05-11 DIAGNOSIS — M25651 Stiffness of right hip, not elsewhere classified: Secondary | ICD-10-CM | POA: Diagnosis not present

## 2021-05-11 DIAGNOSIS — Z4789 Encounter for other orthopedic aftercare: Secondary | ICD-10-CM | POA: Diagnosis not present

## 2021-05-11 DIAGNOSIS — R29898 Other symptoms and signs involving the musculoskeletal system: Secondary | ICD-10-CM | POA: Diagnosis not present

## 2021-05-14 DIAGNOSIS — M25551 Pain in right hip: Secondary | ICD-10-CM | POA: Diagnosis not present

## 2021-05-16 DIAGNOSIS — M25551 Pain in right hip: Secondary | ICD-10-CM | POA: Diagnosis not present

## 2021-05-16 DIAGNOSIS — M25651 Stiffness of right hip, not elsewhere classified: Secondary | ICD-10-CM | POA: Diagnosis not present

## 2021-05-16 DIAGNOSIS — R29898 Other symptoms and signs involving the musculoskeletal system: Secondary | ICD-10-CM | POA: Diagnosis not present

## 2021-05-16 DIAGNOSIS — Z4789 Encounter for other orthopedic aftercare: Secondary | ICD-10-CM | POA: Diagnosis not present

## 2021-05-17 NOTE — Patient Instructions (Addendum)
Cervical Radiculopathy  Cervical radiculopathy happens when a nerve in the neck (a cervical nerve) is pinched or bruised. This condition can happen because of an injury to the cervical spine (vertebrae) in the neck, or as part of the normal aging process. Pressure on the cervical nerves can cause pain or numbness that travels from the neck all the way down to the arm and fingers. This condition usually gets better with rest. Treatment may be needed if the condition does not improve. What are the causes? This condition may be caused by: A neck injury. A bulging (herniated) disk. Muscle spasms. Muscle tightness in the neck due to overuse. Arthritis. Breakdown or degeneration in the bones and joints of the spine (spondylosis) due to aging. Bone spurs that may develop near the cervical nerves. What are the signs or symptoms? Symptoms of this condition include: Pain. The pain may travel from the neck to the arm and hand. The pain can be severe or irritating. It may get worse when you move your neck. Numbness or tingling in your arm or hand. Weakness in the affected arm and hand, in severe cases. How is this diagnosed? This condition may be diagnosed based on your symptoms, your medical history, and a physical exam. You may also have tests, including: X-rays. CT scan. MRI. Electromyogram (EMG). Nerve conduction tests. How is this treated? In many cases, treatment is not needed for this condition. With rest, the condition usually gets better over time. If treatment is needed, options may include: Wearing a soft neck collar (cervical collar) for short periods of time. Doing physical therapy to strengthen your neck muscles. Taking medicines. These may include NSAIDs, such as ibuprofen, or oral corticosteroids. Having spinal injections, in severe cases. Having surgery. This may be needed if other treatments do not help. Different types of surgery may be done depending on the cause of this  condition. Follow these instructions at home: If you have a cervical collar: Wear it as told by your health care provider. Remove it only as told by your health care provider. Ask your health care provider if you can remove the cervical collar for cleaning and bathing. If you are allowed to remove the collar for cleaning or bathing: Follow instructions from your health care provider about how to remove the collar safely. Clean the collar by wiping it with mild soap and water and drying it completely. Take out any removable pads in the collar every 1-2 days, and wash them by hand with soap and water. Let them air-dry completely before you put them back in the collar. Check your skin under the collar for irritation or sores. If you see any, tell your health care provider. Managing pain     Take over-the-counter and prescription medicines only as told by your health care provider. If directed, put ice on the affected area. To do this: If you have a soft neck collar, remove it as told by your health care provider. Put ice in a plastic bag. Place a towel between your skin and the bag. Leave the ice on for 20 minutes, 2-3 times a day. Remove the ice if your skin turns bright red. This is very important. If you cannot feel pain, heat, or cold, you have a greater risk of damage to the area. If applying ice does not help, you can try using heat. Use the heat source that your health care provider recommends, such as a moist heat pack or a heating pad. Place a towel between   your skin and the heat source. Leave the heat on for 20-30 minutes. Remove the heat if your skin turns bright red. This is especially important if you are unable to feel pain, heat, or cold. You have a greater risk of getting burned. Try a gentle neck and shoulder massage to help relieve symptoms. Activity Rest as needed. Return to your normal activities as told by your health care provider. Ask your health care provider what  activities are safe for you. Do stretching and strengthening exercises as told by your health care provider or your physical therapist. You may have to avoid lifting. Ask your health care provider how much you can safely lift. General instructions Use a flat pillow when you sleep. Do not drive while wearing a cervical collar. If you do not have a cervical collar, ask your health care provider if it is safe to drive while your neck heals. Ask your health care provider if the medicine prescribed to you requires you to avoid driving or using machinery. Do not use any products that contain nicotine or tobacco. These products include cigarettes, chewing tobacco, and vaping devices, such as e-cigarettes. If you need help quitting, ask your health care provider. Keep all follow-up visits. This is important. Contact a health care provider if: Your condition does not improve with treatment. Get help right away if: Your pain gets much worse and is not controlled with medicines. You have weakness or numbness in your hand, arm, face, or leg. You have a high fever. You have a stiff, rigid neck. You lose control of your bowels or your bladder (have incontinence). You have trouble with walking, balance, or speaking. Summary Cervical radiculopathy happens when a nerve in the neck is pinched or bruised. A nerve can get pinched from a bulging disk, arthritis, muscle spasms, or an injury to the neck. Symptoms include pain, tingling, or numbness radiating from the neck to the arm or hand. Weakness can also occur in severe cases. Treatment may include rest, wearing a cervical collar, and physical therapy. Medicines may be prescribed to help with pain. In severe cases, injections or surgery may be needed. This information is not intended to replace advice given to you by your health care provider. Make sure you discuss any questions you have with your health care provider. Document Revised: 07/13/2020 Document  Reviewed: 07/13/2020 Elsevier Patient Education  2023 Elsevier Inc.  

## 2021-05-23 DIAGNOSIS — M25651 Stiffness of right hip, not elsewhere classified: Secondary | ICD-10-CM | POA: Diagnosis not present

## 2021-05-23 DIAGNOSIS — Z4789 Encounter for other orthopedic aftercare: Secondary | ICD-10-CM | POA: Diagnosis not present

## 2021-05-23 DIAGNOSIS — R29898 Other symptoms and signs involving the musculoskeletal system: Secondary | ICD-10-CM | POA: Diagnosis not present

## 2021-05-23 DIAGNOSIS — M25551 Pain in right hip: Secondary | ICD-10-CM | POA: Diagnosis not present

## 2021-05-24 ENCOUNTER — Encounter: Payer: Self-pay | Admitting: Physician Assistant

## 2021-05-24 ENCOUNTER — Ambulatory Visit (INDEPENDENT_AMBULATORY_CARE_PROVIDER_SITE_OTHER): Payer: BC Managed Care – PPO | Admitting: Physician Assistant

## 2021-05-24 VITALS — BP 115/67 | HR 84 | Temp 97.7°F | Ht 60.0 in | Wt 151.0 lb

## 2021-05-24 DIAGNOSIS — R202 Paresthesia of skin: Secondary | ICD-10-CM

## 2021-05-24 DIAGNOSIS — M25511 Pain in right shoulder: Secondary | ICD-10-CM

## 2021-05-24 DIAGNOSIS — R200 Anesthesia of skin: Secondary | ICD-10-CM | POA: Diagnosis not present

## 2021-05-24 NOTE — Progress Notes (Signed)
?Established patient visit ? ? ?Patient: Monica Reed   DOB: 1970-07-13   51 y.o. Female  MRN: [id number] ?Visit Date: 05/24/2021 ? ?Chief Complaint  ?Patient presents with  ? Follow-up  ? ?Subjective  ?  ?HPI  ?Patient presents for follow-up on right shoulder pain. Reports pain and tingling sensation was better while on steroid taper but once she completed therapy her pain and tingling resumed like before. Feels like her neck is better and more movable. States had excruciating neck pain before her surgery which seemed to improve when she was given valium after her hip surgery.Has tired the muscle relaxer which helps calm the pain but it does not go away.  ? ? ? ?Medications: ?Outpatient Medications Prior to Visit  ?Medication Sig  ? doxycycline (VIBRAMYCIN) 100 MG capsule Take 100 mg by mouth 2 (two) times daily.  ? FLUoxetine (PROZAC) 20 MG capsule TAKE 3 CAPSULES BY MOUTH EVERY DAY  ? lisinopril-hydrochlorothiazide (ZESTORETIC) 20-12.5 MG tablet Take 1 tablet by mouth daily.  ? loratadine (CLARITIN) 10 MG tablet Take 10 mg by mouth daily.  ? methocarbamol (ROBAXIN) 500 MG tablet Take 1 tablet (500 mg total) by mouth every 8 (eight) hours as needed for muscle spasms.  ? methylPREDNISolone (MEDROL DOSEPAK) 4 MG TBPK tablet Take as directed on package.  ? norethindrone-ethinyl estradiol (LOESTRIN FE) 1-20 MG-MCG tablet Take 1 tablet by mouth daily.  ? phentermine (ADIPEX-P) 37.5 MG tablet Take 1 tablet (37.5 mg total) by mouth daily before breakfast.  ? rosuvastatin (CRESTOR) 5 MG tablet Take 1 tablet (5 mg total) by mouth daily.  ? ?No facility-administered medications prior to visit.  ? ? ?Review of Systems ?Review of Systems:  ?A fourteen system review of systems was performed and found to be positive as per HPI. ? ? ?  Objective  ?  ?BP 115/67   Pulse 84   Temp 97.7 ?F (36.5 ?C)   Ht 5' (1.524 m)   Wt 151 lb (68.5 kg)   SpO2 97%   BMI 29.49 kg/m?  ?BP Readings from Last 3 Encounters:  ?05/24/21  115/67  ?05/10/21 134/83  ?03/12/21 129/78  ? ?Wt Readings from Last 3 Encounters:  ?05/24/21 151 lb (68.5 kg)  ?05/10/21 145 lb (65.8 kg)  ?03/12/21 145 lb (65.8 kg)  ? ? ?Physical Exam  ?General:  Well Developed, well nourished, appropriate for stated age.  ?Neuro:  Alert and oriented,  extra-ocular muscles intact ?HEENT:  Normocephalic, atraumatic, neck supple  ?Skin:  no gross rash, warm, pink. Small to moderate lipoma of right forearm noted.  ?Cardiac:  RRR, S1 S2 ?Respiratory: CTA B/L  ?MSK: no tenderness to palpation of bilateral shoulder, neck or back. Negative empty can test. Good ROM of both shoulders. Upon palpation has tingling sensation along lateral and medial arm as well as upper back (right-sided).  ?Vascular:  Ext warm, no cyanosis apprec.; cap RF less 2 sec. ?Psych:  No HI/SI, judgement and insight good, Euthymic mood. Full Affect. ? ? ?No results found for any visits on 05/24/21. ? Assessment & Plan  ?  ? ? ?Problem List Items Addressed This Visit   ?None ?Visit Diagnoses   ? ? Acute pain of right shoulder    -  Primary  ? Relevant Orders  ? DG Shoulder Right  ? DG Cervical Spine Complete  ? Numbness and tingling of right arm      ? Relevant Orders  ? DG Cervical Spine Complete  ? ?  ? ?  Discussed with patient symptoms are suggestive of nerve involvement which can be related to cervical radiculopathy. Less likely cubital tunnel syndrome since symptoms are occurring along both lateral and medial right arm. Lipoma causing compressive neuropathy is rare but a possibility. Will place orders for right shoulder and cervical x-ray for further evaluation. Pending imaging studies, recommend referral orthopedics. Recommend to continue methocarbamol as needed. ? ? ?Return if symptoms worsen or fail to improve/pending imaging.  ?   ? ? ? ?Mayer Masker, PA-C  ?[facility] ?[phone] (phone) ?[phone] (fax) ? ?[facility] ?

## 2021-05-25 ENCOUNTER — Ambulatory Visit
Admission: RE | Admit: 2021-05-25 | Discharge: 2021-05-25 | Disposition: A | Discharge status: Home / Self Care | Payer: BC Managed Care – PPO | Source: Ambulatory Visit | Attending: Physician Assistant | Admitting: Physician Assistant

## 2021-05-25 DIAGNOSIS — R202 Paresthesia of skin: Secondary | ICD-10-CM | POA: Diagnosis not present

## 2021-05-25 DIAGNOSIS — R29898 Other symptoms and signs involving the musculoskeletal system: Secondary | ICD-10-CM | POA: Diagnosis not present

## 2021-05-25 DIAGNOSIS — M25511 Pain in right shoulder: Secondary | ICD-10-CM | POA: Diagnosis not present

## 2021-05-25 DIAGNOSIS — Z4789 Encounter for other orthopedic aftercare: Secondary | ICD-10-CM | POA: Diagnosis not present

## 2021-05-25 DIAGNOSIS — M25651 Stiffness of right hip, not elsewhere classified: Secondary | ICD-10-CM | POA: Diagnosis not present

## 2021-05-25 DIAGNOSIS — R200 Anesthesia of skin: Secondary | ICD-10-CM | POA: Diagnosis not present

## 2021-05-25 DIAGNOSIS — M25551 Pain in right hip: Secondary | ICD-10-CM | POA: Diagnosis not present

## 2021-05-25 IMAGING — CR DG CERVICAL SPINE COMPLETE 4+V
5 series · 5 of 5 positions shown · non-contrast
Comparison: None Available.

CLINICAL DATA: Numbness and tingling in right arm

EXAM:
CERVICAL SPINE - COMPLETE 4+ VIEW

[w cervical spine lat]
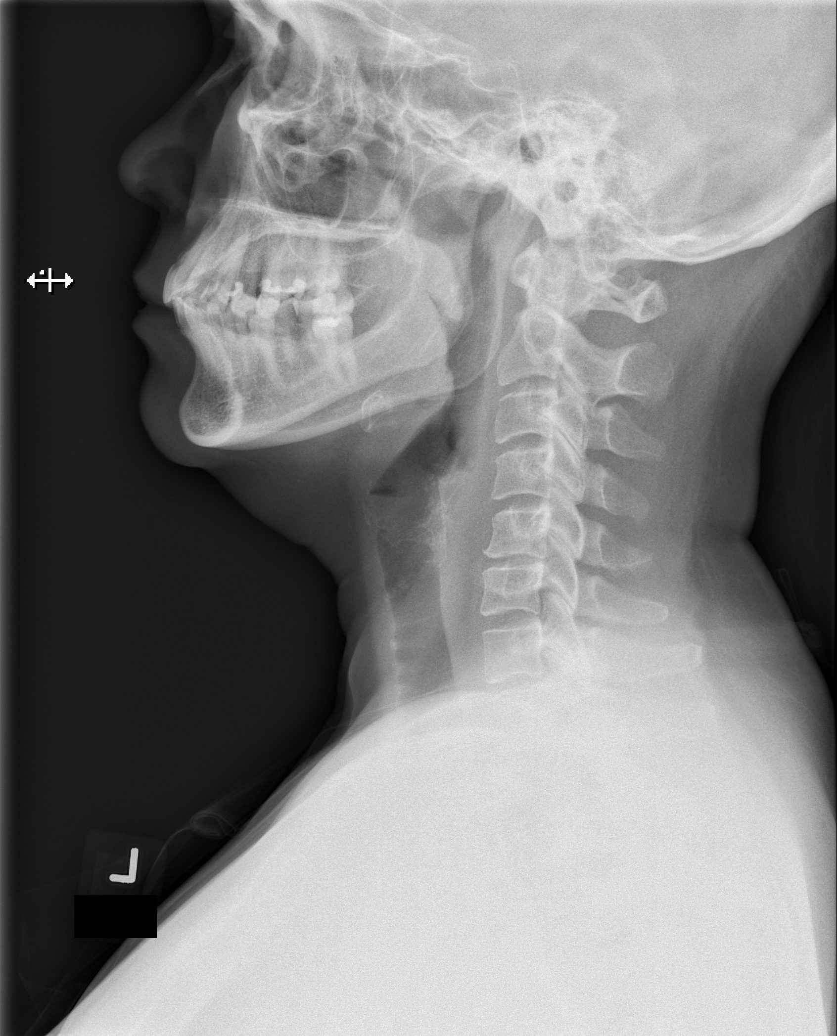

[w cervical spine ap_obl (1 of 2)]
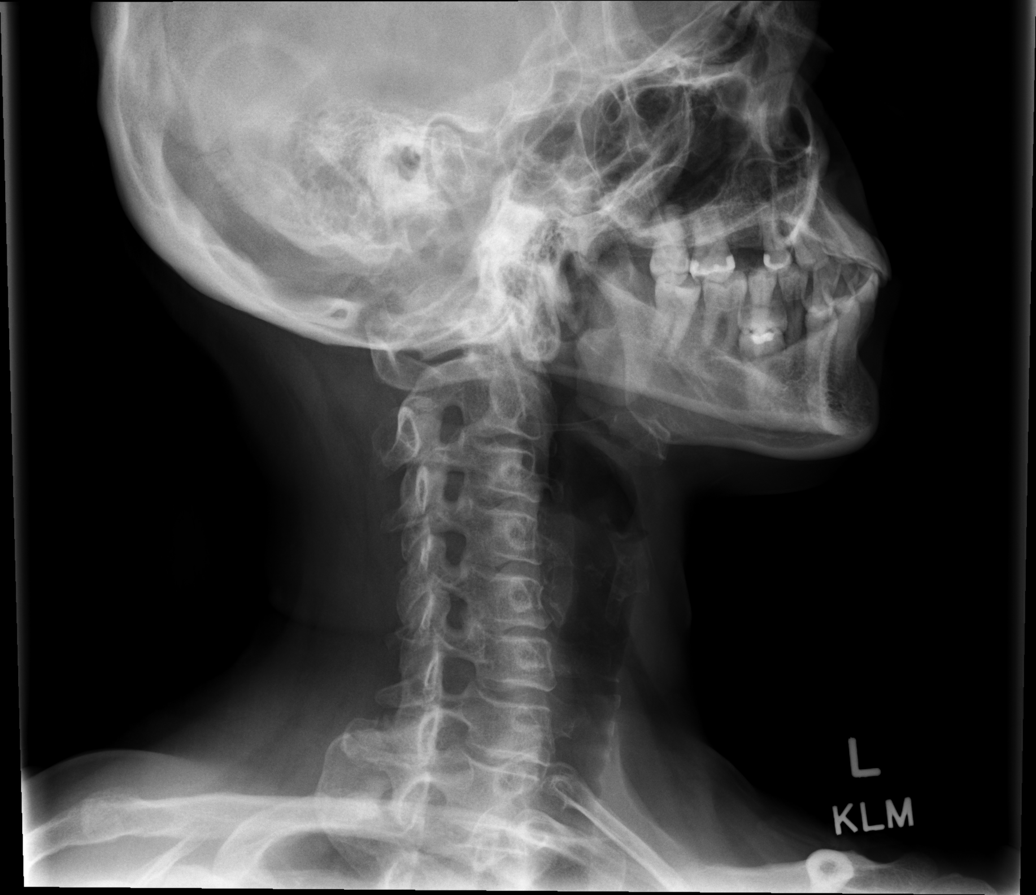

[w cervical spine ap_obl (2 of 2)]
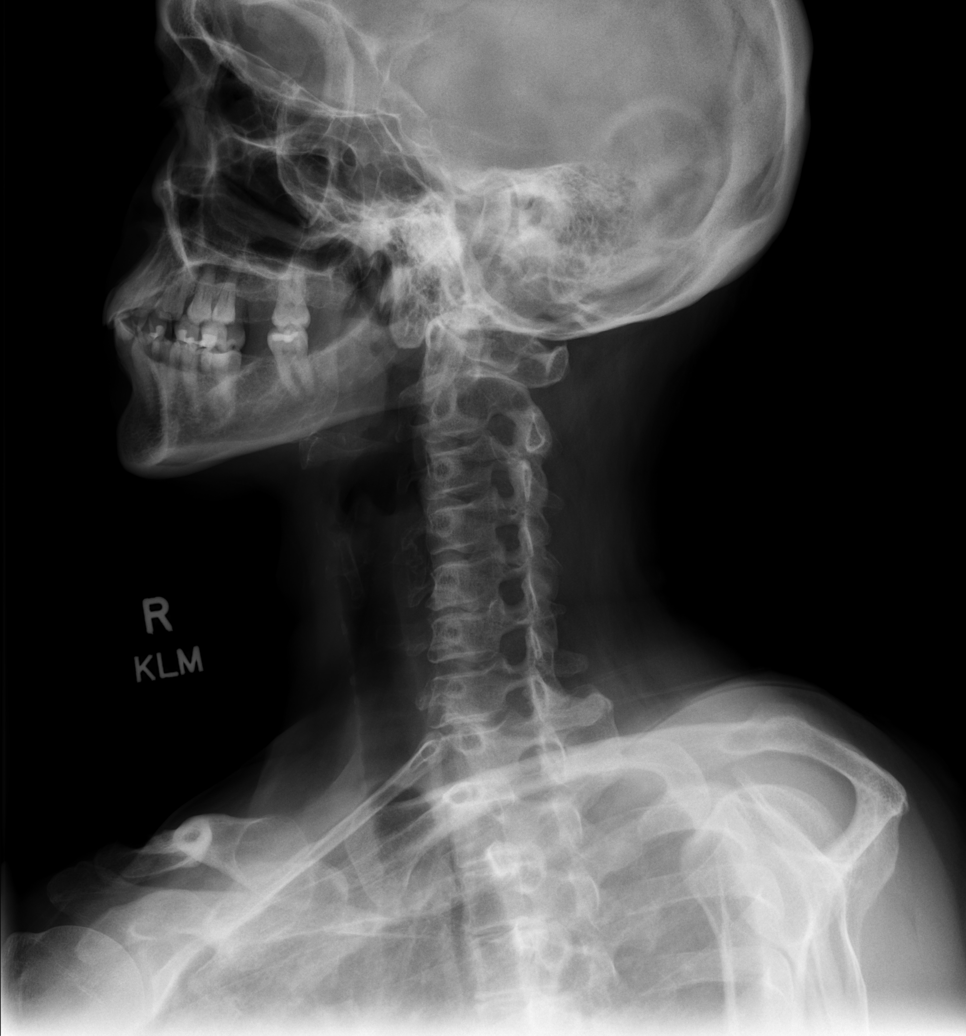

[w cervical spine ap]
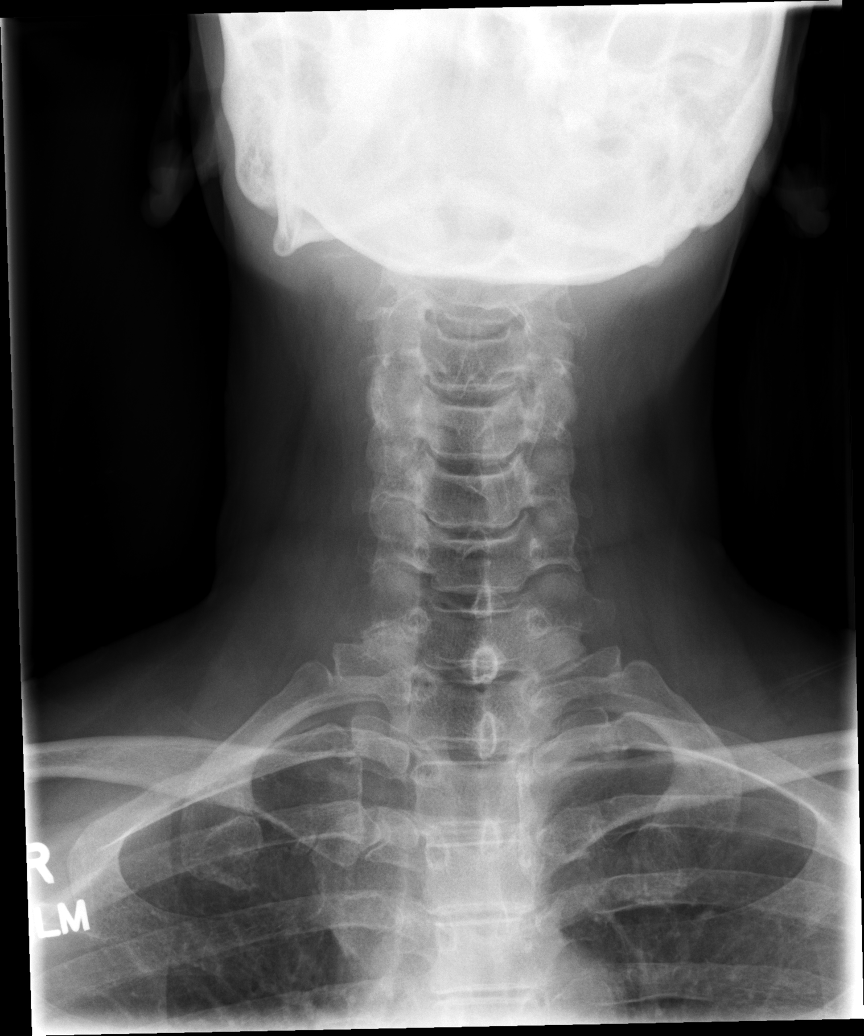

[w cervical spine odontoid]
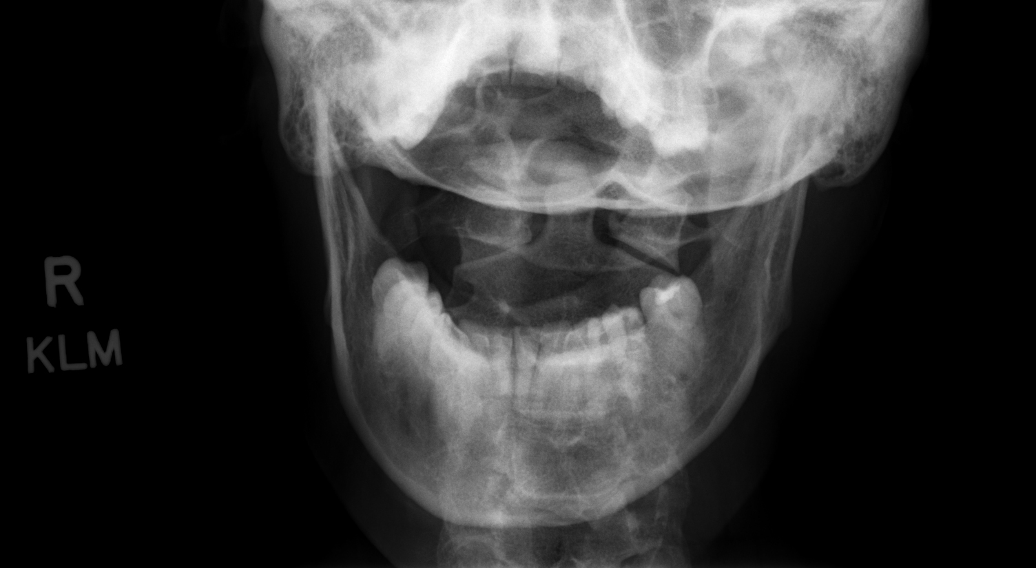

[5 of 5 positions shown; findings below may reference images not displayed]

FINDINGS: Straightening of normal cervical lordosis likely related to patient
positioning or muscle spasm.

Minimal endplate spurring and disc height loss seen at C5-C6. Mild
facet degenerative changes seen throughout the cervical spine. Mild
right neural foraminal stenosis at C5-C6. Minimal left neural
foraminal stenosis at C4-C5.

Visualized prevertebral soft tissues and lung apices are clear.
IMPRESSION: Mild degenerative changes of the cervical spine.

## 2021-05-25 IMAGING — CR DG SHOULDER 2+V*R*
3 series · 3 of 3 positions shown · non-contrast
Comparison: None Available.

CLINICAL DATA: Right shoulder pain for 5-6 months

Numbness and tingling extending down right arm
EXAM:
RIGHT SHOULDER - 2+ VIEW

[w shoulder grashey right]
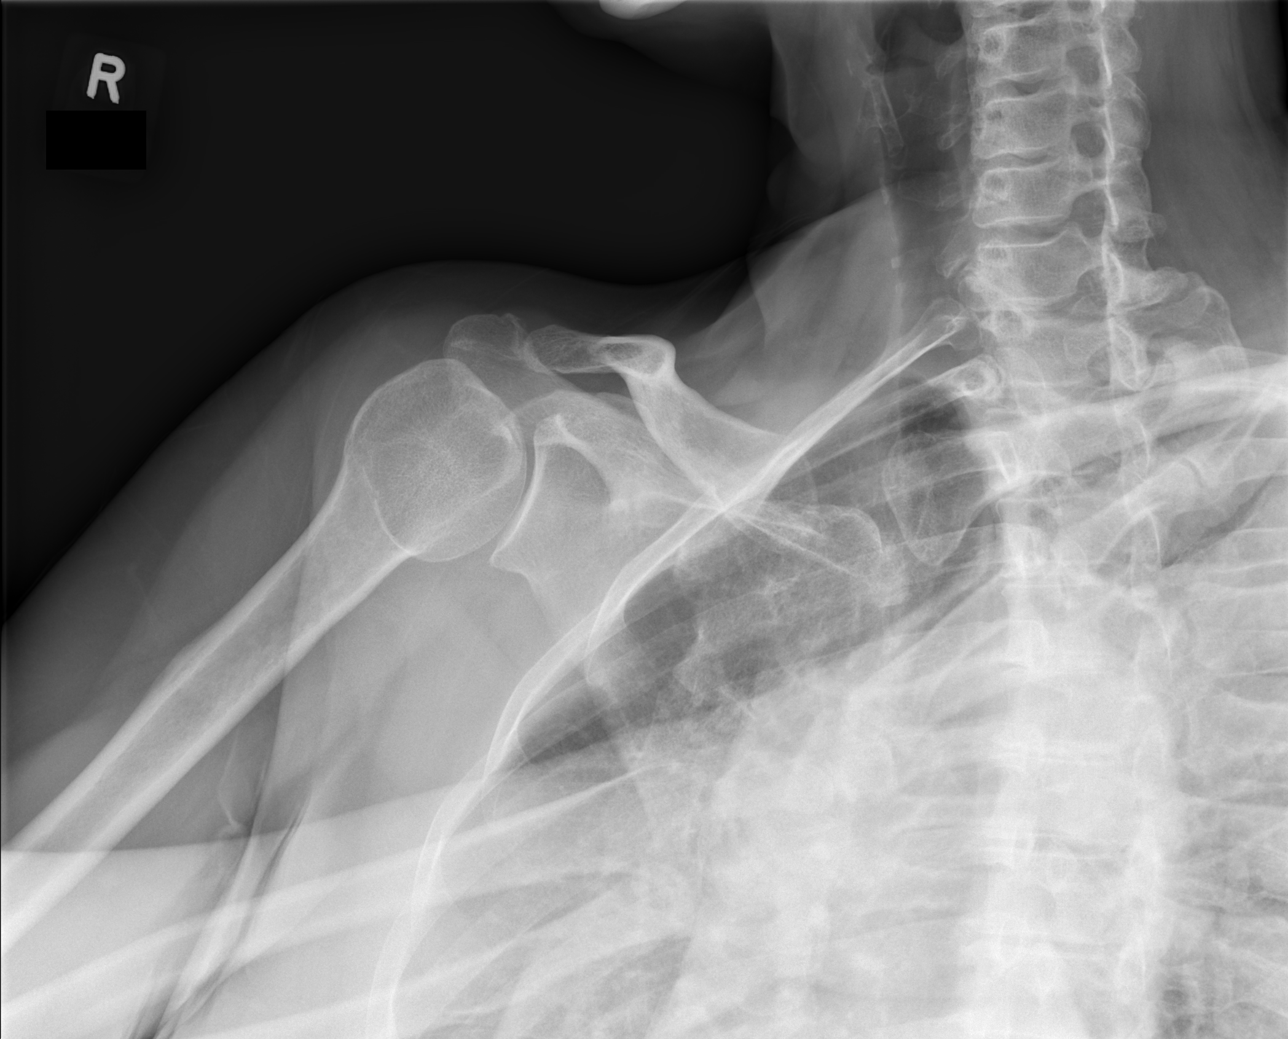

[w shoulder y-view right]
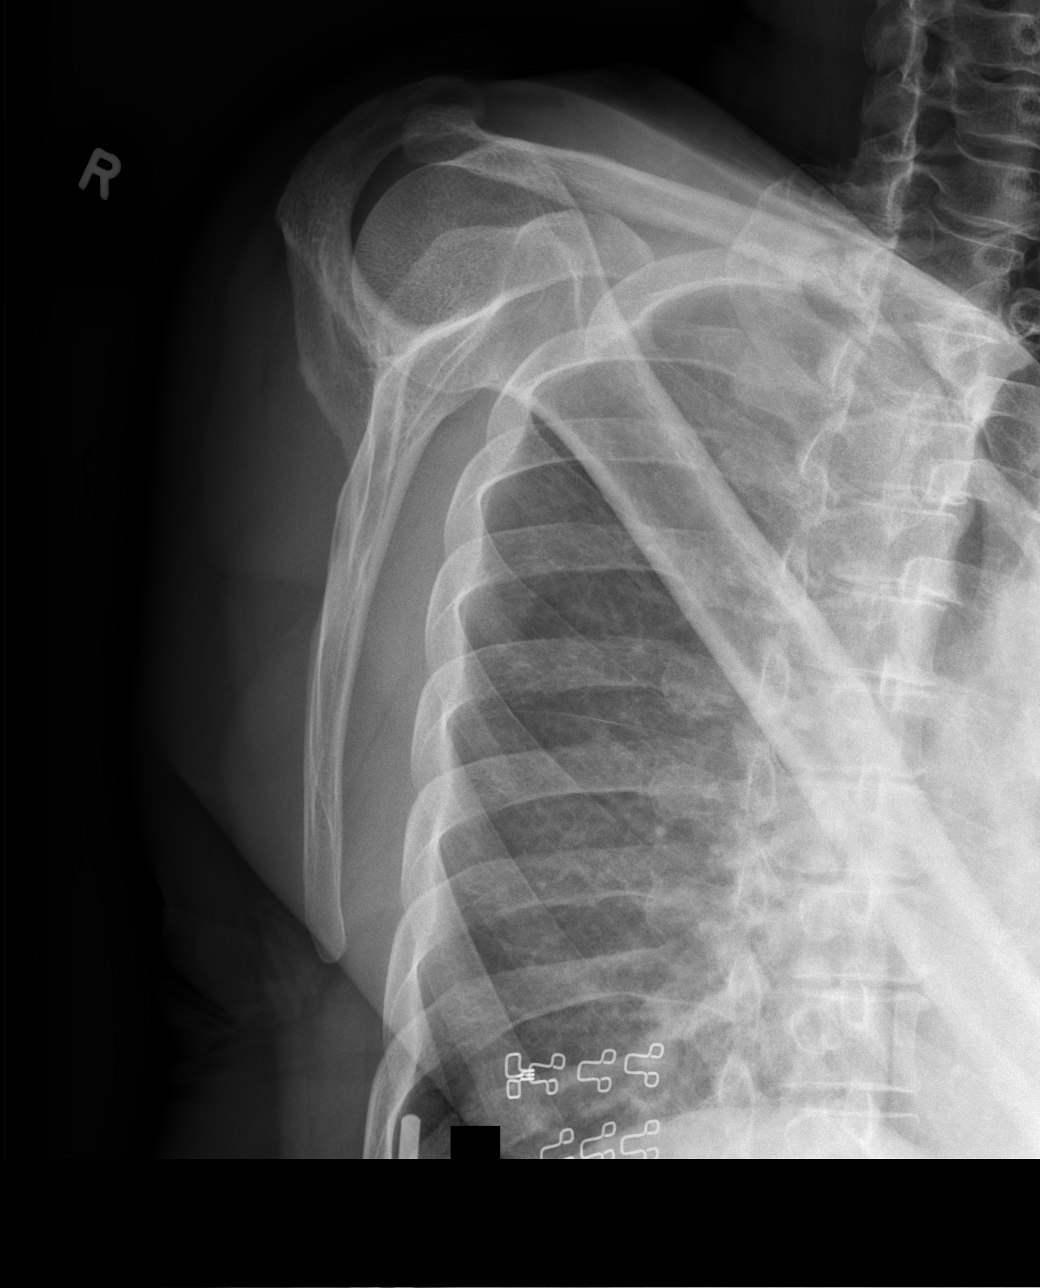

[x shoulder axillary right]
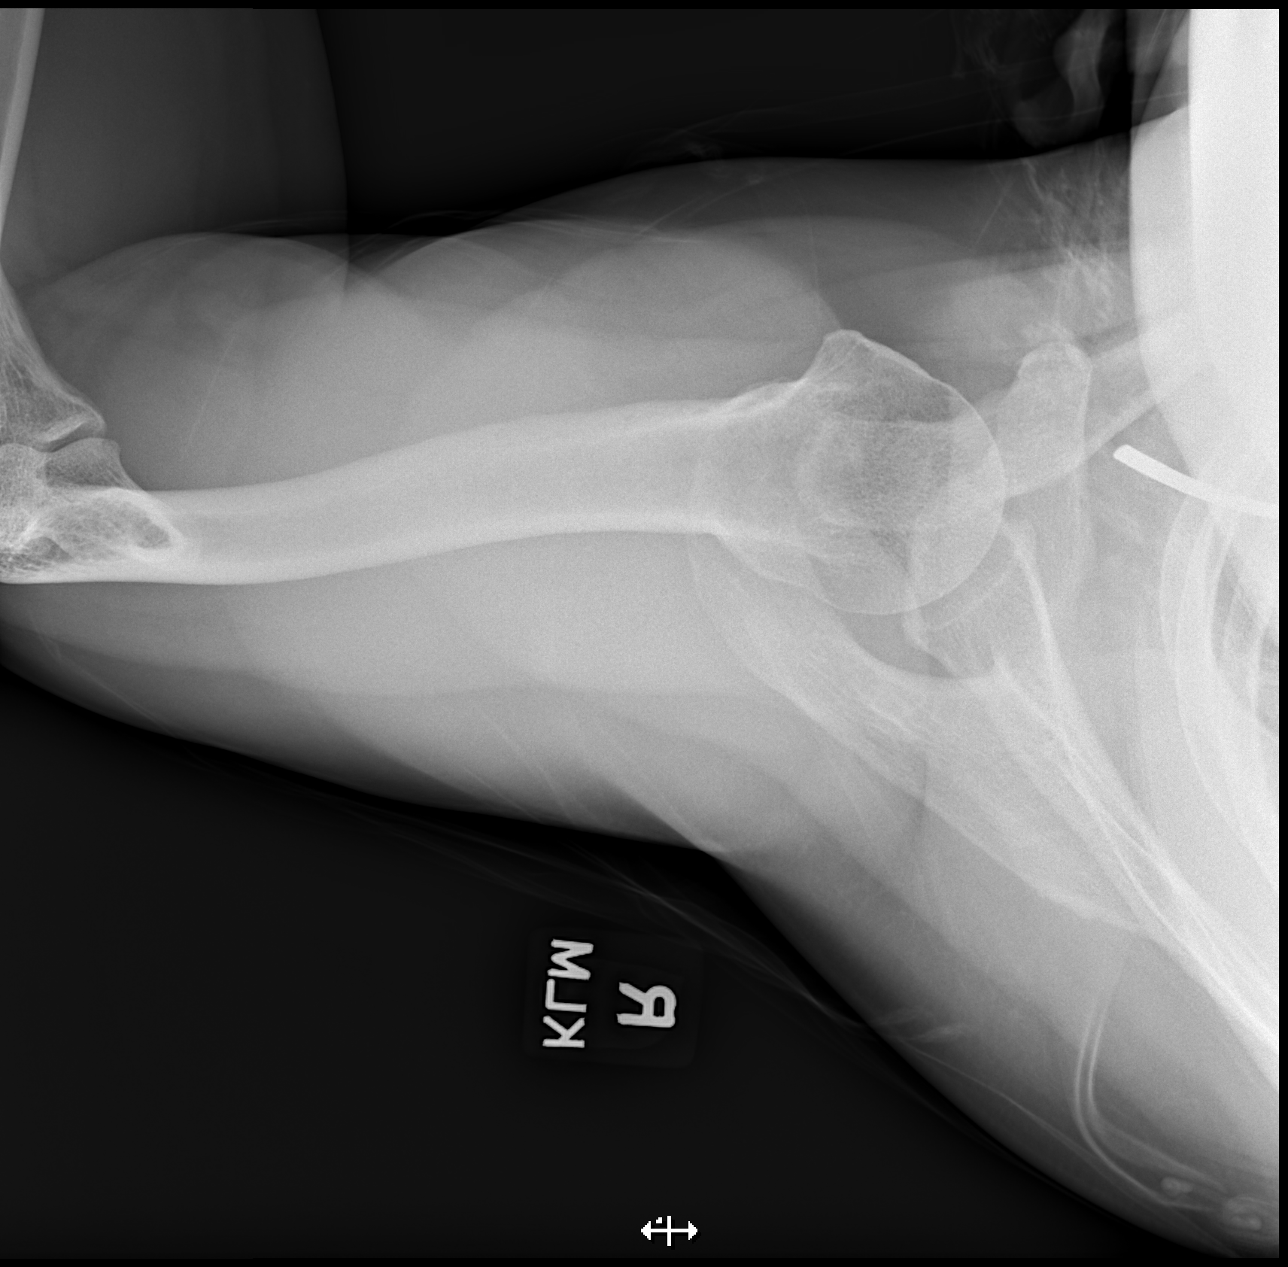

[3 of 3 positions shown; findings below may reference images not displayed]

FINDINGS: Mild degenerative changes of the acromioclavicular joint. No
fracture or dislocation. Soft tissues are normal.
IMPRESSION: Mild acromioclavicular osteoarthrosis.

## 2021-05-28 ENCOUNTER — Encounter: Payer: Self-pay | Admitting: Physician Assistant

## 2021-06-01 DIAGNOSIS — R29898 Other symptoms and signs involving the musculoskeletal system: Secondary | ICD-10-CM | POA: Diagnosis not present

## 2021-06-01 DIAGNOSIS — M25551 Pain in right hip: Secondary | ICD-10-CM | POA: Diagnosis not present

## 2021-06-01 DIAGNOSIS — Z4789 Encounter for other orthopedic aftercare: Secondary | ICD-10-CM | POA: Diagnosis not present

## 2021-06-01 DIAGNOSIS — M25651 Stiffness of right hip, not elsewhere classified: Secondary | ICD-10-CM | POA: Diagnosis not present

## 2021-06-06 DIAGNOSIS — M25651 Stiffness of right hip, not elsewhere classified: Secondary | ICD-10-CM | POA: Diagnosis not present

## 2021-06-06 DIAGNOSIS — M25551 Pain in right hip: Secondary | ICD-10-CM | POA: Diagnosis not present

## 2021-06-06 DIAGNOSIS — R29898 Other symptoms and signs involving the musculoskeletal system: Secondary | ICD-10-CM | POA: Diagnosis not present

## 2021-06-06 DIAGNOSIS — Z4789 Encounter for other orthopedic aftercare: Secondary | ICD-10-CM | POA: Diagnosis not present

## 2021-06-08 DIAGNOSIS — M25551 Pain in right hip: Secondary | ICD-10-CM | POA: Diagnosis not present

## 2021-06-08 DIAGNOSIS — Z4789 Encounter for other orthopedic aftercare: Secondary | ICD-10-CM | POA: Diagnosis not present

## 2021-06-13 DIAGNOSIS — Z4789 Encounter for other orthopedic aftercare: Secondary | ICD-10-CM | POA: Diagnosis not present

## 2021-06-13 DIAGNOSIS — M25551 Pain in right hip: Secondary | ICD-10-CM | POA: Diagnosis not present

## 2021-06-19 DIAGNOSIS — M25551 Pain in right hip: Secondary | ICD-10-CM | POA: Diagnosis not present

## 2021-06-29 DIAGNOSIS — Z4789 Encounter for other orthopedic aftercare: Secondary | ICD-10-CM | POA: Diagnosis not present

## 2021-06-29 DIAGNOSIS — R29898 Other symptoms and signs involving the musculoskeletal system: Secondary | ICD-10-CM | POA: Diagnosis not present

## 2021-06-29 DIAGNOSIS — M25551 Pain in right hip: Secondary | ICD-10-CM | POA: Diagnosis not present

## 2021-06-29 DIAGNOSIS — M25651 Stiffness of right hip, not elsewhere classified: Secondary | ICD-10-CM | POA: Diagnosis not present

## 2021-07-05 ENCOUNTER — Encounter: Payer: Self-pay | Admitting: Nurse Practitioner

## 2021-07-05 ENCOUNTER — Ambulatory Visit (INDEPENDENT_AMBULATORY_CARE_PROVIDER_SITE_OTHER): Payer: BC Managed Care – PPO | Admitting: Nurse Practitioner

## 2021-07-05 VITALS — BP 125/72 | HR 78 | Temp 97.4°F | Ht 59.84 in | Wt 151.0 lb

## 2021-07-05 DIAGNOSIS — Z0001 Encounter for general adult medical examination with abnormal findings: Secondary | ICD-10-CM | POA: Diagnosis not present

## 2021-07-05 DIAGNOSIS — M25511 Pain in right shoulder: Secondary | ICD-10-CM

## 2021-07-05 DIAGNOSIS — M5090 Cervical disc disorder, unspecified, unspecified cervical region: Secondary | ICD-10-CM

## 2021-07-05 DIAGNOSIS — Z23 Encounter for immunization: Secondary | ICD-10-CM | POA: Diagnosis not present

## 2021-07-05 DIAGNOSIS — E7800 Pure hypercholesterolemia, unspecified: Secondary | ICD-10-CM

## 2021-07-05 DIAGNOSIS — Z6828 Body mass index (BMI) 28.0-28.9, adult: Secondary | ICD-10-CM

## 2021-07-05 MED ORDER — PHENTERMINE HCL 37.5 MG PO TABS: 37.5000 mg | ORAL_TABLET | Freq: Every day | ORAL | 0 refills | Status: DC

## 2021-07-05 NOTE — Progress Notes (Unsigned)
Complete physical exam   Patient: Monica Reed   DOB: September 09, 1970   51 y.o. Female  MRN: [id number] Visit Date: 07/05/2021    Chief Complaint  Patient presents with   Annual Exam   Subjective    Monica Reed is a 51 y.o. female who presents today for a complete physical exam.  She reports consuming a general diet. Exercise is limited by orthopedic condition(s): recently had surgery on right hip and has not been released to return to normal exercise routine. . She generally feels well. She does have additional problems to discuss today.   HPI  Annual physical -conitnues to have pain in right side of the neck and right shoulder -x-rays of neck and right shoulder --multi-level degenerative disc disease, especially at C5 and C6. Some foraminal narrowing at this level toward the right --degenerative arthritic changes in right shoulder also noted.  -recently had surgery on right hip. Returned to work two weeks ago. Off crutches but still has some physical mobility limitations. Able to do light house work. -would like to restart phentermine to help lose previously lost weight.   Past Medical History:  Diagnosis Date   Frequent UTI    Hypertension    History reviewed. No pertinent surgical history. Social History   Socioeconomic History   Marital status: Married    Spouse name: Not on file   Number of children: Not on file   Years of education: Not on file   Highest education level: Not on file  Occupational History   Not on file  Tobacco Use   Smoking status: Never   Smokeless tobacco: Never  Vaping Use   Vaping Use: Never used  Substance and Sexual Activity   Alcohol use: No   Drug use: No   Sexual activity: Yes    Partners: Male    Birth control/protection: Pill  Other Topics Concern   Not on file  Social History Narrative   Not on file   Social Determinants of Health   Financial Resource Strain: Not on file  Food Insecurity: Not on file   Transportation Needs: Not on file  Physical Activity: Not on file  Stress: Not on file  Social Connections: Not on file  Intimate Partner Violence: Not on file   Family Status  Relation Name Status   Mother  Alive   Father  Deceased   Sister  Alive   Brother  Alive   Daughter  Nature conservation officer   Daughter  Alive   Daughter  Alive   Family History  Problem Relation Age of Onset   Arthritis Mother    Cancer Father        prostate   Heart attack Father    Healthy Sister    Healthy Brother    Hypertension Daughter    Healthy Brother    Healthy Daughter    Healthy Daughter    Allergies  Allergen Reactions   Cyclobenzaprine Swelling and Anaphylaxis    Had difficulty swallowing after taking  Had difficulty swallowing after taking     Patient Care Team: Carlean Jews, NP as PCP - General (Family Medicine) Carrington Clamp, MD as Consulting Physician (Obstetrics and Gynecology)   Medications: Outpatient Medications Prior to Visit  Medication Sig   FLUoxetine (PROZAC) 20 MG capsule TAKE 3 CAPSULES BY MOUTH EVERY DAY   lisinopril-hydrochlorothiazide (ZESTORETIC) 20-12.5 MG tablet Take 1 tablet by mouth daily.   loratadine (CLARITIN) 10 MG tablet Take 10  mg by mouth daily.   methocarbamol (ROBAXIN) 500 MG tablet Take 1 tablet (500 mg total) by mouth every 8 (eight) hours as needed for muscle spasms.   norethindrone-ethinyl estradiol (LOESTRIN FE) 1-20 MG-MCG tablet Take 1 tablet by mouth daily.   phentermine (ADIPEX-P) 37.5 MG tablet Take 1 tablet (37.5 mg total) by mouth daily before breakfast.   rosuvastatin (CRESTOR) 5 MG tablet Take 1 tablet (5 mg total) by mouth daily.   [DISCONTINUED] doxycycline (VIBRAMYCIN) 100 MG capsule Take 100 mg by mouth 2 (two) times daily.   [DISCONTINUED] methylPREDNISolone (MEDROL DOSEPAK) 4 MG TBPK tablet Take as directed on package.   No facility-administered medications prior to visit.    Review of Systems     Objective      Today's Vitals   07/05/21 1006  BP: 125/72  Pulse: 78  Temp: (!) 97.4 F (36.3 C)  SpO2: 100%  Weight: 151 lb (68.5 kg)  Height: 4' 11.84" (1.52 m)   Body mass index is 29.65 kg/m.   BP Readings from Last 3 Encounters:  07/05/21 125/72  05/24/21 115/67  05/10/21 134/83    Wt Readings from Last 3 Encounters:  07/05/21 151 lb (68.5 kg)  05/24/21 151 lb (68.5 kg)  05/10/21 145 lb (65.8 kg)     Physical Exam  ***  Last depression screening scores    07/05/2021   10:08 AM 05/24/2021    1:49 PM 05/10/2021   10:25 AM  PHQ 2/9 Scores  PHQ - 2 Score 0 0 0  PHQ- 9 Score 0 0 0   Last fall risk screening    05/24/2021    1:49 PM  Fall Risk   Falls in the past year? 0  Number falls in past yr: 0  Injury with Fall? 0  Risk for fall due to : No Fall Risks  Follow up Falls evaluation completed   Last Audit-C alcohol use screening     No data to display         A score of 3 or more in women, and 4 or more in men indicates increased risk for alcohol abuse, EXCEPT if all of the points are from question 1   No results found for any visits on 07/05/21.  Assessment & Plan    Routine Health Maintenance and Physical Exam  Exercise Activities and Dietary recommendations  Goals   None     Immunization History  Administered Date(s) Administered   PFIZER(Purple Top)SARS-COV-2 Vaccination 05/13/2019, 06/03/2019   Tdap 12/23/2014   Unspecified SARS-COV-2 Vaccination 04/22/2019   Zoster Recombinat (Shingrix) 07/05/2021    Health Maintenance  Topic Date Due   HIV Screening  Never done   Hepatitis C Screening  Never done   COLONOSCOPY (Pts 45-63yrs Insurance coverage will need to be confirmed)  Never done   COVID-19 Vaccine (4 - Booster) 07/29/2019   PAP SMEAR-Modifier  08/06/2021   INFLUENZA VACCINE  08/21/2021   Zoster Vaccines- Shingrix (2 of 2) 08/30/2021   MAMMOGRAM  11/17/2022   TETANUS/TDAP  12/22/2024   HPV VACCINES  Aged Out    Discussed health  benefits of physical activity, and encouraged her to engage in regular exercise appropriate for her age and condition.  Problem List Items Addressed This Visit   None Visit Diagnoses     Need for shingles vaccine    -  Primary   Relevant Orders   Varicella-zoster vaccine IM (Completed)        Return for 3months for  2nd dose of shingle.        Carlean Jews, NP  [facility] [phone] (phone) [phone] (fax)  [facility]

## 2021-07-10 ENCOUNTER — Encounter: Payer: BC Managed Care – PPO | Admitting: Nurse Practitioner

## 2021-07-15 DIAGNOSIS — M5090 Cervical disc disorder, unspecified, unspecified cervical region: Secondary | ICD-10-CM | POA: Insufficient documentation

## 2021-07-15 DIAGNOSIS — M25511 Pain in right shoulder: Secondary | ICD-10-CM | POA: Insufficient documentation

## 2021-07-19 ENCOUNTER — Ambulatory Visit: Payer: BC Managed Care – PPO | Admitting: Family Medicine

## 2021-07-19 ENCOUNTER — Other Ambulatory Visit: Payer: Self-pay

## 2021-07-19 DIAGNOSIS — E7800 Pure hypercholesterolemia, unspecified: Secondary | ICD-10-CM

## 2021-07-19 DIAGNOSIS — Z0000 Encounter for general adult medical examination without abnormal findings: Secondary | ICD-10-CM

## 2021-07-19 DIAGNOSIS — R5383 Other fatigue: Secondary | ICD-10-CM

## 2021-07-19 DIAGNOSIS — I10 Essential (primary) hypertension: Secondary | ICD-10-CM

## 2021-07-19 DIAGNOSIS — E559 Vitamin D deficiency, unspecified: Secondary | ICD-10-CM

## 2021-07-23 ENCOUNTER — Other Ambulatory Visit: Payer: BC Managed Care – PPO

## 2021-07-23 DIAGNOSIS — M25651 Stiffness of right hip, not elsewhere classified: Secondary | ICD-10-CM | POA: Diagnosis not present

## 2021-07-23 DIAGNOSIS — R29898 Other symptoms and signs involving the musculoskeletal system: Secondary | ICD-10-CM | POA: Diagnosis not present

## 2021-07-23 DIAGNOSIS — Z0000 Encounter for general adult medical examination without abnormal findings: Secondary | ICD-10-CM | POA: Diagnosis not present

## 2021-07-23 DIAGNOSIS — R5383 Other fatigue: Secondary | ICD-10-CM

## 2021-07-23 DIAGNOSIS — E559 Vitamin D deficiency, unspecified: Secondary | ICD-10-CM

## 2021-07-23 DIAGNOSIS — E7800 Pure hypercholesterolemia, unspecified: Secondary | ICD-10-CM

## 2021-07-23 DIAGNOSIS — M25551 Pain in right hip: Secondary | ICD-10-CM | POA: Diagnosis not present

## 2021-07-23 DIAGNOSIS — I10 Essential (primary) hypertension: Secondary | ICD-10-CM

## 2021-07-24 LAB — HEMOGLOBIN A1C
Est. average glucose Bld gHb Est-mCnc: 114 mg/dL
Hgb A1c MFr Bld: 5.6 % (ref 4.8–5.6)

## 2021-07-24 LAB — CBC WITH DIFFERENTIAL/PLATELET
Basophils Absolute: 0 10*3/uL (ref 0.0–0.2)
Basos: 0 %
EOS (ABSOLUTE): 0.1 10*3/uL (ref 0.0–0.4)
Eos: 1 %
Hematocrit: 44 % (ref 34.0–46.6)
Hemoglobin: 14.8 g/dL (ref 11.1–15.9)
Immature Grans (Abs): 0 10*3/uL (ref 0.0–0.1)
Immature Granulocytes: 0 %
Lymphocytes Absolute: 2.2 10*3/uL (ref 0.7–3.1)
Lymphs: 31 %
MCH: 30.2 pg (ref 26.6–33.0)
MCHC: 33.6 g/dL (ref 31.5–35.7)
MCV: 90 fL (ref 79–97)
Monocytes Absolute: 0.4 10*3/uL (ref 0.1–0.9)
Monocytes: 5 %
Neutrophils Absolute: 4.4 10*3/uL (ref 1.4–7.0)
Neutrophils: 63 %
Platelets: 356 10*3/uL (ref 150–450)
RBC: 4.9 x10E6/uL (ref 3.77–5.28)
RDW: 12.2 % (ref 11.7–15.4)
WBC: 7.1 10*3/uL (ref 3.4–10.8)

## 2021-07-24 LAB — COMPREHENSIVE METABOLIC PANEL
ALT: 10 IU/L (ref 0–32)
AST: 14 IU/L (ref 0–40)
Albumin/Globulin Ratio: 1.8 (ref 1.2–2.2)
Albumin: 4.3 g/dL (ref 3.8–4.9)
Alkaline Phosphatase: 65 IU/L (ref 44–121)
BUN/Creatinine Ratio: 15 (ref 9–23)
BUN: 11 mg/dL (ref 6–24)
Bilirubin Total: 0.4 mg/dL (ref 0.0–1.2)
CO2: 24 mmol/L (ref 20–29)
Calcium: 9.3 mg/dL (ref 8.7–10.2)
Chloride: 101 mmol/L (ref 96–106)
Creatinine, Ser: 0.72 mg/dL (ref 0.57–1.00)
Globulin, Total: 2.4 g/dL (ref 1.5–4.5)
Glucose: 77 mg/dL (ref 70–99)
Potassium: 4.1 mmol/L (ref 3.5–5.2)
Sodium: 138 mmol/L (ref 134–144)
Total Protein: 6.7 g/dL (ref 6.0–8.5)
eGFR: 101 mL/min/{1.73_m2} (ref >59)

## 2021-07-24 LAB — LIPID PANEL
Chol/HDL Ratio: 3.5 ratio (ref 0.0–4.4)
Cholesterol, Total: 199 mg/dL (ref 100–199)
HDL: 57 mg/dL (ref >39)
LDL Chol Calc (NIH): 125 mg/dL — ABNORMAL HIGH (ref 0–99)
Triglycerides: 94 mg/dL (ref 0–149)
VLDL Cholesterol Cal: 17 mg/dL (ref 5–40)

## 2021-07-24 LAB — TSH: TSH: 2.13 u[IU]/mL (ref 0.450–4.500)

## 2021-07-24 LAB — VITAMIN D 25 HYDROXY (VIT D DEFICIENCY, FRACTURES): Vit D, 25-Hydroxy: 16.1 ng/mL — ABNORMAL LOW (ref 30.0–100.0)

## 2021-07-24 LAB — T4, FREE: Free T4: 1.06 ng/dL (ref 0.82–1.77)

## 2021-07-24 NOTE — Progress Notes (Signed)
Vitamin d deficiency. Mild elevation of LDL. Discuss labs with patient at visit 08/01/2021

## 2021-07-26 NOTE — Progress Notes (Signed)
I, Christoper Fabian, LAT, ATC, am serving as scribe for Dr. [name].  Lively Haberman Maillet is a 51 y.o. female who presents to [facility] at [facility] today for R shoulder pain.  She was last seen by Dr. Denyse Amass on 12/21/20 for R hip pain and was ultimately referred to Dr. Caswell Corwin.  Today, pt reports R shoulder pain since March 2023.  She locates her pain to her entire R arm w/ paresthesias noted in her R 5th finger and ulnar hand.  She is already had an x-ray, prednisone, gabapentin, Intrarosa muscle relaxer for this.  Additionally she is even had physical therapy for this problem.  She is receiving physical therapy for her hip but is also had some cervical spine related physical therapy at the same time over the last 2 months for her pain.  Radiating pain: yes R shoulder mechanical symptoms: yes Neck pain: no R UE paresthesias: yes in her R 5th finger and ulnar side of her hand Aggravating factors: reaching overhead; laying on her R side Treatments tried: prednisone; muscle relaxer;   Diagnostic testing: R shoulder and c-spine XR- 05/25/21   Pertinent review of systems: No fevers or chills  Relevant historical information: Depression   Exam:  BP 122/68 (BP Location: Left Arm, Patient Position: Sitting, Cuff Size: Normal)   Pulse 88   Ht 4' 11.84" (1.52 m)   Wt 147 lb 9.6 oz (67 kg)   SpO2 98%   BMI 28.98 kg/m  General: Well Developed, well nourished, and in no acute distress.   MSK: C-spine: Normal. Nontender midline. Tender palpation right cervical paraspinal musculature and right trapezius. Decreased cervical motion. Upper extremity strength is intact. Reflexes are intact. Positive right-sided Spurling's test.  Right shoulder: Normal. Tender palpation right trapezius.  Otherwise nontender. Intact strength. Negative Hawkins and Neer's test.     Lab and Radiology Results DG Cervical Spine Complete  Result Date: 05/26/2021 CLINICAL DATA:  Numbness  and tingling in right arm EXAM: CERVICAL SPINE - COMPLETE 4+ VIEW COMPARISON:  None Available. FINDINGS: Straightening of normal cervical lordosis likely related to patient positioning or muscle spasm. Minimal endplate spurring and disc height loss seen at C5-C6. Mild facet degenerative changes seen throughout the cervical spine. Mild right neural foraminal stenosis at C5-C6. Minimal left neural foraminal stenosis at C4-C5. Visualized prevertebral soft tissues and lung apices are clear. IMPRESSION: Mild degenerative changes of the cervical spine. Electronically Signed   By: Acquanetta Belling M.D.   On: 05/26/2021 10:43   DG Shoulder Right  Result Date: 05/26/2021 CLINICAL DATA:  Right shoulder pain for 5-6 months Numbness and tingling extending down right arm EXAM: RIGHT SHOULDER - 2+ VIEW COMPARISON:  None Available. FINDINGS: Mild degenerative changes of the acromioclavicular joint. No fracture or dislocation. Soft tissues are normal. IMPRESSION: Mild acromioclavicular osteoarthrosis. Electronically Signed   By: Acquanetta Belling M.D.   On: 05/26/2021 10:35   I, [name], personally (independently) visualized and performed the interpretation of the images attached in this note.      Assessment and Plan: 51 y.o. female with right cervical radiculopathy likely C8.  This has been ongoing for greater than 2 months.  She is already had a trial of physical therapy, prednisone x2, and even gabapentin and muscle relaxers without good long-lasting benefit.  At this point plan for MRI of her cervical spine to evaluate for source of presumed cervical radiculopathy.  MRI should also be helpful for epidural steroid injection planning.  Likely  proceed directly to epidural steroid injection following MRI.  We will try or refill gabapentin at a higher dose.   PDMP not reviewed this encounter. Orders Placed This Encounter  Procedures   MR Cervical Spine Wo Contrast    Standing Status:   Future    Standing Expiration  Date:   08/01/2022    Order Specific Question:   What is the patient's sedation requirement?    Answer:   No Sedation    Order Specific Question:   Does the patient have a pacemaker or implanted devices?    Answer:   No    Order Specific Question:   Preferred imaging location?    Answer:   [facility] (table limit-550lbs)   Meds ordered this encounter  Medications   gabapentin (NEURONTIN) 100 MG capsule    Sig: Take 1-3 capsules (100-300 mg total) by mouth 3 (three) times daily as needed.    Dispense:  90 capsule    Refill:  1     Discussed warning signs or symptoms. Please see discharge instructions. Patient expresses understanding.   The above documentation has been reviewed and is accurate and complete [name], M.D.

## 2021-07-31 ENCOUNTER — Encounter: Payer: Self-pay | Admitting: Family Medicine

## 2021-07-31 ENCOUNTER — Ambulatory Visit (INDEPENDENT_AMBULATORY_CARE_PROVIDER_SITE_OTHER): Payer: BC Managed Care – PPO | Admitting: Family Medicine

## 2021-07-31 VITALS — BP 122/68 | HR 88 | Ht 59.84 in | Wt 147.6 lb

## 2021-07-31 DIAGNOSIS — M5010 Cervical disc disorder with radiculopathy, unspecified cervical region: Secondary | ICD-10-CM | POA: Diagnosis not present

## 2021-07-31 MED ORDER — GABAPENTIN 100 MG PO CAPS: 100.0000 mg | ORAL_CAPSULE | Freq: Three times a day (TID) | ORAL | 1 refills | Status: DC | PRN

## 2021-07-31 NOTE — Patient Instructions (Addendum)
Good to see you today.  You should hear from MRI scheduling within 1 week. If you do not hear please let me know.    I've prescribed Gabapentin.  Follow-up: After the MRI and likely injection.

## 2021-07-31 NOTE — Progress Notes (Deleted)
Established patient visit   Patient: Monica Reed   DOB: [birth year]   51 y.o. Female  MRN: [id number] Visit Date: 08/01/2021   No chief complaint on file.  Subjective    HPI  Started on phentermine at most recent visit  Starting weight 07/05/2021 - 151 Today;s weight 08/01/2021 -  - negative side effects of phentermine -    Medications: Outpatient Medications Prior to Visit  Medication Sig   FLUoxetine (PROZAC) 20 MG capsule TAKE 3 CAPSULES BY MOUTH EVERY DAY   gabapentin (NEURONTIN) 100 MG capsule Take 1-3 capsules (100-300 mg total) by mouth 3 (three) times daily as needed.   lisinopril-hydrochlorothiazide (ZESTORETIC) 20-12.5 MG tablet Take 1 tablet by mouth daily.   loratadine (CLARITIN) 10 MG tablet Take 10 mg by mouth daily.   methocarbamol (ROBAXIN) 500 MG tablet Take 1 tablet (500 mg total) by mouth every 8 (eight) hours as needed for muscle spasms.   norethindrone-ethinyl estradiol (LOESTRIN FE) 1-20 MG-MCG tablet Take 1 tablet by mouth daily.   phentermine (ADIPEX-P) 37.5 MG tablet Take 1 tablet (37.5 mg total) by mouth daily before breakfast.   rosuvastatin (CRESTOR) 5 MG tablet Take 1 tablet (5 mg total) by mouth daily.   No facility-administered medications prior to visit.    Review of Systems  {Labs (Optional):23779}   Objective    There were no vitals taken for this visit. BP Readings from Last 3 Encounters:  07/31/21 122/68  07/05/21 125/72  05/24/21 115/67    Wt Readings from Last 3 Encounters:  07/31/21 147 lb 9.6 oz (67 kg)  07/05/21 151 lb (68.5 kg)  05/24/21 151 lb (68.5 kg)    Physical Exam  ***  No results found for any visits on 08/01/21.  Assessment & Plan     Problem List Items Addressed This Visit   None    No follow-ups on file.         Carlean Jews, NP  [facility] at [facility] [phone] (phone) [phone] (fax)  [facility]

## 2021-08-01 ENCOUNTER — Ambulatory Visit: Payer: BC Managed Care – PPO | Admitting: Nurse Practitioner

## 2021-08-01 DIAGNOSIS — M25551 Pain in right hip: Secondary | ICD-10-CM | POA: Diagnosis not present

## 2021-08-01 DIAGNOSIS — R29898 Other symptoms and signs involving the musculoskeletal system: Secondary | ICD-10-CM | POA: Diagnosis not present

## 2021-08-01 DIAGNOSIS — M25651 Stiffness of right hip, not elsewhere classified: Secondary | ICD-10-CM | POA: Diagnosis not present

## 2021-08-06 ENCOUNTER — Other Ambulatory Visit: Payer: Self-pay | Admitting: Nurse Practitioner

## 2021-08-06 DIAGNOSIS — Z6828 Body mass index (BMI) 28.0-28.9, adult: Secondary | ICD-10-CM

## 2021-08-06 MED ORDER — PHENTERMINE HCL 37.5 MG PO TABS: 37.5000 mg | ORAL_TABLET | Freq: Every day | ORAL | 0 refills | Status: DC

## 2021-08-06 NOTE — Progress Notes (Signed)
Renewed phentermine for this time only. She has follow up scheduled for 08/22/2021.

## 2021-08-15 ENCOUNTER — Ambulatory Visit
Admission: RE | Admit: 2021-08-15 | Discharge: 2021-08-15 | Disposition: A | Discharge status: Home / Self Care | Payer: BC Managed Care – PPO | Source: Ambulatory Visit | Attending: Family Medicine | Admitting: Family Medicine

## 2021-08-15 DIAGNOSIS — R200 Anesthesia of skin: Secondary | ICD-10-CM | POA: Diagnosis not present

## 2021-08-15 DIAGNOSIS — M5010 Cervical disc disorder with radiculopathy, unspecified cervical region: Secondary | ICD-10-CM

## 2021-08-15 DIAGNOSIS — R202 Paresthesia of skin: Secondary | ICD-10-CM | POA: Diagnosis not present

## 2021-08-16 ENCOUNTER — Telehealth: Payer: Self-pay | Admitting: Family Medicine

## 2021-08-16 DIAGNOSIS — M5010 Cervical disc disorder with radiculopathy, unspecified cervical region: Secondary | ICD-10-CM

## 2021-08-16 NOTE — Telephone Encounter (Signed)
Epidural steroid injection ordered 

## 2021-08-16 NOTE — Progress Notes (Signed)
MRI cervical spine shows areas where nerves could get pinched causing pain down your right arm.  I have ordered an epidural steroid injection that should help.  [place] [facility] should contact you to schedule.  If they do not the phone number is [phone].  Recommend checking back with me in about a month.  We will go over the results of the MRI and how the injection went.

## 2021-08-22 ENCOUNTER — Encounter: Payer: Self-pay | Admitting: Nurse Practitioner

## 2021-08-22 ENCOUNTER — Ambulatory Visit (INDEPENDENT_AMBULATORY_CARE_PROVIDER_SITE_OTHER): Payer: BC Managed Care – PPO | Admitting: Nurse Practitioner

## 2021-08-22 VITALS — BP 129/79 | HR 83 | Temp 97.3°F | Ht 60.0 in | Wt 148.0 lb

## 2021-08-22 DIAGNOSIS — E782 Mixed hyperlipidemia: Secondary | ICD-10-CM

## 2021-08-22 DIAGNOSIS — I10 Essential (primary) hypertension: Secondary | ICD-10-CM | POA: Diagnosis not present

## 2021-08-22 DIAGNOSIS — Z6828 Body mass index (BMI) 28.0-28.9, adult: Secondary | ICD-10-CM

## 2021-08-22 DIAGNOSIS — E559 Vitamin D deficiency, unspecified: Secondary | ICD-10-CM | POA: Diagnosis not present

## 2021-08-22 MED ORDER — PHENTERMINE HCL 37.5 MG PO TABS: 37.5000 mg | ORAL_TABLET | Freq: Every day | ORAL | 0 refills | Status: DC

## 2021-08-22 MED ORDER — ERGOCALCIFEROL 1.25 MG (50000 UT) PO CAPS: 50000.0000 [IU] | ORAL_CAPSULE | ORAL | 5 refills | Status: DC

## 2021-08-22 NOTE — Progress Notes (Signed)
Established patient visit   Patient: Monica Reed   DOB: [birth year]   51 y.o. Female  MRN: [id number] Visit Date: 08/22/2021   Chief Complaint  Patient presents with   Follow-up    Weight management   Subjective    HPI HPI     Follow-up    Additional comments: Weight management      Last edited by Adelfa Koh, CMA on 08/22/2021  1:55 PM.      Weight management follow up -restarted phentermine at recent visit  -initial weight 07/05/2021 - 151 pounds  -today's weight - 08/22/2021 - 148 pounds  -weight loss since last visit - 3 pounds  -negative side effects from taking phentermine - she denies negative side effects   Routine, fasting labs done prior to this visit -mild elevation of LDL  with remainder of lipid panel normal. Continue to take rosuvastatin 5 mg daily  -vitamin d deficiency - will treat with Drisdol.    Medications: Outpatient Medications Prior to Visit  Medication Sig   FLUoxetine (PROZAC) 20 MG capsule TAKE 3 CAPSULES BY MOUTH EVERY DAY   gabapentin (NEURONTIN) 100 MG capsule Take 1-3 capsules (100-300 mg total) by mouth 3 (three) times daily as needed.   lisinopril-hydrochlorothiazide (ZESTORETIC) 20-12.5 MG tablet Take 1 tablet by mouth daily.   loratadine (CLARITIN) 10 MG tablet Take 10 mg by mouth daily.   methocarbamol (ROBAXIN) 500 MG tablet Take 1 tablet (500 mg total) by mouth every 8 (eight) hours as needed for muscle spasms.   norethindrone-ethinyl estradiol (LOESTRIN FE) 1-20 MG-MCG tablet Take 1 tablet by mouth daily.   rosuvastatin (CRESTOR) 5 MG tablet Take 1 tablet (5 mg total) by mouth daily.   [DISCONTINUED] phentermine (ADIPEX-P) 37.5 MG tablet Take 1 tablet (37.5 mg total) by mouth daily before breakfast.   No facility-administered medications prior to visit.    Review of Systems  Constitutional:  Negative for activity change, appetite change, chills, fatigue and fever.       Three pound weight loss since last visit in this  office.   HENT:  Negative for congestion, postnasal drip, rhinorrhea, sinus pressure, sinus pain, sneezing and sore throat.   Eyes: Negative.   Respiratory:  Negative for cough, chest tightness, shortness of breath and wheezing.   Cardiovascular:  Negative for chest pain and palpitations.  Gastrointestinal:  Negative for abdominal pain, constipation, diarrhea, nausea and vomiting.  Endocrine: Negative for cold intolerance, heat intolerance, polydipsia and polyuria.  Genitourinary:  Negative for dyspareunia, dysuria, flank pain, frequency and urgency.  Musculoskeletal:  Negative for arthralgias, back pain and myalgias.  Skin:  Negative for rash.  Allergic/Immunologic: Negative for environmental allergies.  Neurological:  Negative for dizziness, weakness and headaches.  Hematological:  Negative for adenopathy.  Psychiatric/Behavioral:  The patient is not nervous/anxious.     Last CBC Lab Results  Component Value Date   WBC 7.1 07/23/2021   HGB 14.8 07/23/2021   HCT 44.0 07/23/2021   MCV 90 07/23/2021   MCH 30.2 07/23/2021   RDW 12.2 07/23/2021   PLT 356 03/83/3383   Last metabolic panel Lab Results  Component Value Date   GLUCOSE 77 07/23/2021   NA 138 07/23/2021   K 4.1 07/23/2021   CL 101 07/23/2021   CO2 24 07/23/2021   BUN 11 07/23/2021   CREATININE 0.72 07/23/2021   EGFR 101 07/23/2021   CALCIUM 9.3 07/23/2021   PROT 6.7 07/23/2021   ALBUMIN 4.3 07/23/2021   LABGLOB 2.4 07/23/2021  AGRATIO 1.8 07/23/2021   BILITOT 0.4 07/23/2021   ALKPHOS 65 07/23/2021   AST 14 07/23/2021   ALT 10 07/23/2021   Last lipids Lab Results  Component Value Date   CHOL 199 07/23/2021   HDL 57 07/23/2021   LDLCALC 125 (H) 07/23/2021   TRIG 94 07/23/2021   CHOLHDL 3.5 07/23/2021   Last hemoglobin A1c Lab Results  Component Value Date   HGBA1C 5.6 07/23/2021   Last thyroid functions Lab Results  Component Value Date   TSH 2.130 07/23/2021   Last vitamin D Lab Results   Component Value Date   VD25OH 16.1 (L) 07/23/2021       Objective     Today's Vitals   08/22/21 1352  BP: 129/79  Pulse: 83  Temp: (!) 97.3 F (36.3 C)  TempSrc: Temporal  Weight: 148 lb (67.1 kg)  Height: 5' (1.524 m)   Body mass index is 28.9 kg/m.   BP Readings from Last 3 Encounters:  08/22/21 129/79  07/31/21 122/68  07/05/21 125/72    Wt Readings from Last 3 Encounters:  08/22/21 148 lb (67.1 kg)  07/31/21 147 lb 9.6 oz (67 kg)  07/05/21 151 lb (68.5 kg)    Physical Exam Vitals and nursing note reviewed.  Constitutional:      Appearance: Normal appearance. She is well-developed.  HENT:     Head: Normocephalic and atraumatic.     Nose: Nose normal.     Mouth/Throat:     Mouth: Mucous membranes are moist.     Pharynx: Oropharynx is clear.  Eyes:     Extraocular Movements: Extraocular movements intact.     Conjunctiva/sclera: Conjunctivae normal.     Pupils: Pupils are equal, round, and reactive to light.  Cardiovascular:     Rate and Rhythm: Normal rate and regular rhythm.     Pulses: Normal pulses.     Heart sounds: Normal heart sounds.  Pulmonary:     Effort: Pulmonary effort is normal.     Breath sounds: Normal breath sounds.  Abdominal:     Palpations: Abdomen is soft.  Musculoskeletal:        General: Normal range of motion.     Cervical back: Normal range of motion and neck supple.  Lymphadenopathy:     Cervical: No cervical adenopathy.  Skin:    General: Skin is warm and dry.     Capillary Refill: Capillary refill takes less than 2 seconds.  Neurological:     General: No focal deficit present.     Mental Status: She is alert and oriented to person, place, and time.  Psychiatric:        Mood and Affect: Mood normal.        Behavior: Behavior normal.        Thought Content: Thought content normal.        Judgment: Judgment normal.       Assessment & Plan    1. Vitamin D deficiency Treat with drisdol 50000 iu weekly for next few  months. Follow with over the counter vitamin D 2000 iu daily. Recheck in one year.  - ergocalciferol (DRISDOL) 1.25 MG (50000 UT) capsule; Take 1 capsule (50,000 Units total) by mouth once a week.  Dispense: 4 capsule; Refill: 5  2. Mixed hyperlipidemia Only mild elevation of LDL with remainder of lipid panel stable. Continue current dose rosuvastatin   3. Primary hypertension Stable. Continue bp medication as prescribed   4. BMI 28.0-28.9,adult Continue phentermine daily. Discussed  lowering calorie intake to 1500 calories per day. She does pla to increase exercise as soon as she is released for normal activity per her orthopedic surgeon.  - phentermine (ADIPEX-P) 37.5 MG tablet; Take 1 tablet (37.5 mg total) by mouth daily before breakfast.  Dispense: 30 tablet; Refill: 0   Problem List Items Addressed This Visit       Cardiovascular and Mediastinum   Primary hypertension     Other   Vitamin D deficiency - Primary   Relevant Medications   ergocalciferol (DRISDOL) 1.25 MG (50000 UT) capsule   BMI 28.0-28.9,adult   Relevant Medications   phentermine (ADIPEX-P) 37.5 MG tablet   Mixed hyperlipidemia     Return in about 4 weeks (around 09/19/2021) for routine - weight management.         Ronnell Freshwater, NP  [facility] at [facility] [phone] (phone) [phone] (fax)  [place]

## 2021-09-10 ENCOUNTER — Other Ambulatory Visit: Payer: Self-pay | Admitting: Nurse Practitioner

## 2021-09-10 DIAGNOSIS — Z6828 Body mass index (BMI) 28.0-28.9, adult: Secondary | ICD-10-CM

## 2021-09-10 NOTE — Telephone Encounter (Signed)
Disregard the Tdap sentence. That was a mistype on my end.

## 2021-09-10 NOTE — Telephone Encounter (Signed)
Please let the patient know that she needs to make an appointment to be seen for this medication. Tdap vaccine administered during today's visit.

## 2021-09-17 DIAGNOSIS — M25551 Pain in right hip: Secondary | ICD-10-CM | POA: Diagnosis not present

## 2021-09-18 NOTE — Progress Notes (Deleted)
Established patient visit   Patient: Monica Reed   DOB: [birth year]   51 y.o. Female  MRN: [id number] Visit Date: 09/19/2021   No chief complaint on file.  Subjective    HPI  Weight management follow up -restarted phentermine at recent visit  -initial weight 07/05/2021 - 151 pounds -most recent weight - 08/22/2021 - 148 pounds -today's weight - 09/19/2021 -  -weight loss since last visit -  Total  weight loss since restarting phentermine -  -negative side effects from taking phentermine - she denies negative side effects    Medications: Outpatient Medications Prior to Visit  Medication Sig   ergocalciferol (DRISDOL) 1.25 MG (50000 UT) capsule Take 1 capsule (50,000 Units total) by mouth once a week.   FLUoxetine (PROZAC) 20 MG capsule TAKE 3 CAPSULES BY MOUTH EVERY DAY   gabapentin (NEURONTIN) 100 MG capsule Take 1-3 capsules (100-300 mg total) by mouth 3 (three) times daily as needed.   lisinopril-hydrochlorothiazide (ZESTORETIC) 20-12.5 MG tablet Take 1 tablet by mouth daily.   loratadine (CLARITIN) 10 MG tablet Take 10 mg by mouth daily.   methocarbamol (ROBAXIN) 500 MG tablet Take 1 tablet (500 mg total) by mouth every 8 (eight) hours as needed for muscle spasms.   norethindrone-ethinyl estradiol (LOESTRIN FE) 1-20 MG-MCG tablet Take 1 tablet by mouth daily.   phentermine (ADIPEX-P) 37.5 MG tablet Take 1 tablet (37.5 mg total) by mouth daily before breakfast.   rosuvastatin (CRESTOR) 5 MG tablet Take 1 tablet (5 mg total) by mouth daily.   No facility-administered medications prior to visit.    Review of Systems  {Labs (Optional):23779}   Objective    There were no vitals taken for this visit. BP Readings from Last 3 Encounters:  08/22/21 129/79  07/31/21 122/68  07/05/21 125/72    Wt Readings from Last 3 Encounters:  08/22/21 148 lb (67.1 kg)  07/31/21 147 lb 9.6 oz (67 kg)  07/05/21 151 lb (68.5 kg)    Physical Exam  ***  No results found for any  visits on 09/19/21.  Assessment & Plan     Problem List Items Addressed This Visit   None    No follow-ups on file.         Carlean Jews, NP  [facility] at [facility] [phone] (phone) [phone] (fax)  [facility]

## 2021-09-19 ENCOUNTER — Ambulatory Visit: Payer: BC Managed Care – PPO | Admitting: Nurse Practitioner

## 2021-09-26 ENCOUNTER — Other Ambulatory Visit: Payer: Self-pay | Admitting: Nurse Practitioner

## 2021-09-26 ENCOUNTER — Encounter: Payer: Self-pay | Admitting: Nurse Practitioner

## 2021-09-26 ENCOUNTER — Ambulatory Visit: Payer: BC Managed Care – PPO | Admitting: Nurse Practitioner

## 2021-09-26 VITALS — BP 128/80 | HR 78 | Ht 60.0 in | Wt 146.0 lb

## 2021-09-26 DIAGNOSIS — E7800 Pure hypercholesterolemia, unspecified: Secondary | ICD-10-CM

## 2021-09-26 DIAGNOSIS — I10 Essential (primary) hypertension: Secondary | ICD-10-CM

## 2021-09-26 DIAGNOSIS — Z6828 Body mass index (BMI) 28.0-28.9, adult: Secondary | ICD-10-CM | POA: Diagnosis not present

## 2021-09-26 DIAGNOSIS — E782 Mixed hyperlipidemia: Secondary | ICD-10-CM | POA: Diagnosis not present

## 2021-09-26 MED ORDER — PHENTERMINE HCL 37.5 MG PO TABS: 37.5000 mg | ORAL_TABLET | Freq: Every day | ORAL | 0 refills | Status: DC

## 2021-09-26 NOTE — Progress Notes (Signed)
Established patient visit   Patient: Monica Reed   DOB: [birth year]   51 y.o. Female  MRN: [id number] Visit Date: 09/26/2021   Chief Complaint  Patient presents with   Weight Check   Subjective    HPI  Weight management follow up -restarted phentermine at recent visit  -initial weight 6/15/2/023 - 151 pounds  -most recent weight  08/22/2021 - 148 pounds  -today's weight - 09/26/2021 - 148 pounds  -weight loss since last visit - 2 pounds  -total weight loss since restarting phentermine -  6 pounds  -negative side effects from taking phentermine - she denies negative side effects  -has recently been released per her orthopedic provider to return to normal activity. Advised to start slow and advance slowlyl     Medications: Outpatient Medications Prior to Visit  Medication Sig   ergocalciferol (DRISDOL) 1.25 MG (50000 UT) capsule Take 1 capsule (50,000 Units total) by mouth once a week.   FLUoxetine (PROZAC) 20 MG capsule TAKE 3 CAPSULES BY MOUTH EVERY DAY   gabapentin (NEURONTIN) 100 MG capsule Take 1-3 capsules (100-300 mg total) by mouth 3 (three) times daily as needed.   lisinopril-hydrochlorothiazide (ZESTORETIC) 20-12.5 MG tablet Take 1 tablet by mouth daily.   loratadine (CLARITIN) 10 MG tablet Take 10 mg by mouth daily.   methocarbamol (ROBAXIN) 500 MG tablet Take 1 tablet (500 mg total) by mouth every 8 (eight) hours as needed for muscle spasms.   norethindrone-ethinyl estradiol (LOESTRIN FE) 1-20 MG-MCG tablet Take 1 tablet by mouth daily.   rosuvastatin (CRESTOR) 5 MG tablet TAKE 1 TABLET (5 MG TOTAL) BY MOUTH DAILY.   [DISCONTINUED] phentermine (ADIPEX-P) 37.5 MG tablet Take 1 tablet (37.5 mg total) by mouth daily before breakfast.   No facility-administered medications prior to visit.    Review of Systems  Constitutional:  Negative for activity change, appetite change, chills, fatigue and fever.       Tow pound weight loss since her last visit   HENT:   Negative for congestion, postnasal drip, rhinorrhea, sinus pressure, sinus pain, sneezing and sore throat.   Eyes: Negative.   Respiratory:  Negative for cough, chest tightness, shortness of breath and wheezing.   Cardiovascular:  Negative for chest pain and palpitations.  Gastrointestinal:  Negative for abdominal pain, constipation, diarrhea, nausea and vomiting.  Endocrine: Negative for cold intolerance, heat intolerance, polydipsia and polyuria.  Genitourinary:  Negative for dyspareunia, dysuria, flank pain, frequency and urgency.  Musculoskeletal:  Negative for arthralgias, back pain and myalgias.       Improved right hip pain. Recently released per her orthopedic provider to return to normal activity level.   Skin:  Negative for rash.  Allergic/Immunologic: Negative for environmental allergies.  Neurological:  Negative for dizziness, facial asymmetry, weakness and headaches.  Hematological:  Negative for adenopathy.  Psychiatric/Behavioral:  The patient is not nervous/anxious.       Objective     Today's Vitals   09/26/21 1115  BP: 128/80  Pulse: 78  SpO2: 96%  Weight: 146 lb (66.2 kg)  Height: 5' (1.524 m)   Body mass index is 28.51 kg/m.   BP Readings from Last 3 Encounters:  09/26/21 128/80  08/22/21 129/79  07/31/21 122/68    Wt Readings from Last 3 Encounters:  09/26/21 146 lb (66.2 kg)  08/22/21 148 lb (67.1 kg)  07/31/21 147 lb 9.6 oz (67 kg)    Physical Exam Vitals and nursing note reviewed.  Constitutional:  Appearance: Normal appearance. She is well-developed.  HENT:     Head: Normocephalic and atraumatic.     Nose: Nose normal.     Mouth/Throat:     Mouth: Mucous membranes are moist.     Pharynx: Oropharynx is clear.  Eyes:     Extraocular Movements: Extraocular movements intact.     Conjunctiva/sclera: Conjunctivae normal.     Pupils: Pupils are equal, round, and reactive to light.  Cardiovascular:     Rate and Rhythm: Normal rate and  regular rhythm.     Pulses: Normal pulses.     Heart sounds: Normal heart sounds.  Pulmonary:     Effort: Pulmonary effort is normal.     Breath sounds: Normal breath sounds.  Abdominal:     Palpations: Abdomen is soft.  Musculoskeletal:        General: Normal range of motion.     Cervical back: Normal range of motion and neck supple.  Lymphadenopathy:     Cervical: No cervical adenopathy.  Skin:    General: Skin is warm and dry.     Capillary Refill: Capillary refill takes less than 2 seconds.  Neurological:     General: No focal deficit present.     Mental Status: She is alert and oriented to person, place, and time.  Psychiatric:        Mood and Affect: Mood normal.        Behavior: Behavior normal.        Thought Content: Thought content normal.        Judgment: Judgment normal.      Assessment & Plan    1. Primary hypertension Stable. Continue bp medication as prescribed   2. Mixed hyperlipidemia Continue rosuvastatin as prescribed   3. BMI 28.0-28.9,adult Improving. May continue phentermine daily. Advised she limit calorie intake to 1500 calories per day or less. She should start slowly with exercise and gradually increase as tolerated to prevent recurrent injury of right hip.  - phentermine (ADIPEX-P) 37.5 MG tablet; Take 1 tablet (37.5 mg total) by mouth daily before breakfast.  Dispense: 30 tablet; Refill: 0   Problem List Items Addressed This Visit       Cardiovascular and Mediastinum   Primary hypertension - Primary     Other   BMI 28.0-28.9,adult   Relevant Medications   phentermine (ADIPEX-P) 37.5 MG tablet   Mixed hyperlipidemia     Return in about 4 weeks (around 10/24/2021) for routine - weight management.         Carlean Jews, NP  [facility] at [facility] [phone] (phone) [phone] (fax)  [facility]

## 2021-10-22 ENCOUNTER — Other Ambulatory Visit: Payer: Self-pay | Admitting: Nurse Practitioner

## 2021-10-22 DIAGNOSIS — F330 Major depressive disorder, recurrent, mild: Secondary | ICD-10-CM

## 2021-10-23 ENCOUNTER — Other Ambulatory Visit: Payer: Self-pay | Admitting: Nurse Practitioner

## 2021-10-23 DIAGNOSIS — Z6828 Body mass index (BMI) 28.0-28.9, adult: Secondary | ICD-10-CM

## 2021-10-29 ENCOUNTER — Ambulatory Visit (INDEPENDENT_AMBULATORY_CARE_PROVIDER_SITE_OTHER): Payer: BC Managed Care – PPO | Admitting: Nurse Practitioner

## 2021-10-29 VITALS — BP 108/70 | HR 73 | Ht 60.0 in | Wt 143.4 lb

## 2021-10-29 DIAGNOSIS — Z23 Encounter for immunization: Secondary | ICD-10-CM

## 2021-10-29 NOTE — Progress Notes (Signed)
Patient is her for her 2nd shingle vaccine pt tolerated injection

## 2021-11-07 ENCOUNTER — Ambulatory Visit: Payer: BC Managed Care – PPO | Admitting: Nurse Practitioner

## 2021-11-13 ENCOUNTER — Encounter: Payer: Self-pay | Admitting: Nurse Practitioner

## 2021-11-13 ENCOUNTER — Ambulatory Visit (INDEPENDENT_AMBULATORY_CARE_PROVIDER_SITE_OTHER): Payer: BC Managed Care – PPO | Admitting: Nurse Practitioner

## 2021-11-13 VITALS — BP 109/72 | HR 84 | Ht 60.0 in | Wt 142.4 lb

## 2021-11-13 DIAGNOSIS — Z1211 Encounter for screening for malignant neoplasm of colon: Secondary | ICD-10-CM

## 2021-11-13 DIAGNOSIS — Z6828 Body mass index (BMI) 28.0-28.9, adult: Secondary | ICD-10-CM

## 2021-11-13 DIAGNOSIS — I10 Essential (primary) hypertension: Secondary | ICD-10-CM | POA: Diagnosis not present

## 2021-11-13 MED ORDER — PHENTERMINE HCL 37.5 MG PO TABS: 37.5000 mg | ORAL_TABLET | Freq: Every day | ORAL | 1 refills | Status: DC

## 2021-11-13 NOTE — Progress Notes (Signed)
Established patient visit   Patient: Monica Reed   DOB: 23-Oct-1970   51 y.o. Female  MRN: [id number] Visit Date: 11/13/2021   Chief Complaint  Patient presents with   Weight Check   Subjective    HPI  Weight management follow up -restarted phentermine at recent visit  -initial weight 6/15/2/023 - 151 pounds  -most recent weight  09/26/2021 - 148 pounds  -today's weight - 11/13/2021 - 142 pounds -weight loss since last visit - 6 pounds  -total weight loss since restarting phentermine -  12 pounds  -negative side effects from taking phentermine - she denies negative side effects  She is gradually increasing her her activity level. States that she is not moving as fast as she was, but is doing well.  .denies chest pain, chest pressure, or shortness of breath. She denies headaches or visual disturbances. She denies abdominal pain, nausea, vomiting, or changes in bowel or bladder habits.   -she is due for colon cancer screening today.   Medications: Outpatient Medications Prior to Visit  Medication Sig   ergocalciferol (DRISDOL) 1.25 MG (50000 UT) capsule Take 1 capsule (50,000 Units total) by mouth once a week.   FLUoxetine (PROZAC) 20 MG capsule TAKE 3 CAPSULES BY MOUTH EVERY DAY   gabapentin (NEURONTIN) 100 MG capsule Take 1-3 capsules (100-300 mg total) by mouth 3 (three) times daily as needed.   lisinopril-hydrochlorothiazide (ZESTORETIC) 20-12.5 MG tablet Take 1 tablet by mouth daily.   loratadine (CLARITIN) 10 MG tablet Take 10 mg by mouth daily.   methocarbamol (ROBAXIN) 500 MG tablet Take 1 tablet (500 mg total) by mouth every 8 (eight) hours as needed for muscle spasms.   norethindrone-ethinyl estradiol (LOESTRIN FE) 1-20 MG-MCG tablet Take 1 tablet by mouth daily.   rosuvastatin (CRESTOR) 5 MG tablet TAKE 1 TABLET (5 MG TOTAL) BY MOUTH DAILY.   [DISCONTINUED] phentermine (ADIPEX-P) 37.5 MG tablet TAKE 1 TABLET BY MOUTH DAILY BEFORE BREAKFAST   No  facility-administered medications prior to visit.    Review of Systems  Constitutional:  Negative for activity change, appetite change, chills, fatigue and fever.       Six pound weight loss since last visit.   HENT:  Negative for congestion, postnasal drip, rhinorrhea, sinus pressure, sinus pain, sneezing and sore throat.   Eyes: Negative.   Respiratory:  Negative for cough, chest tightness, shortness of breath and wheezing.   Cardiovascular:  Negative for chest pain and palpitations.  Gastrointestinal:  Negative for abdominal pain, constipation, diarrhea, nausea and vomiting.  Endocrine: Negative for cold intolerance, heat intolerance, polydipsia and polyuria.  Genitourinary:  Negative for dyspareunia, dysuria, flank pain, frequency and urgency.  Musculoskeletal:  Negative for arthralgias, back pain and myalgias.       Recent surgery on right hip. Still on light duty at work. Gradually walking faster and increasing activity levels.   Skin:  Negative for rash.  Allergic/Immunologic: Negative for environmental allergies.  Neurological:  Negative for dizziness, weakness and headaches.  Hematological:  Negative for adenopathy.  Psychiatric/Behavioral:  The patient is not nervous/anxious.      Objective     Today's Vitals   11/13/21 1318  BP: 109/72  Pulse: 84  SpO2: 100%  Weight: 142 lb 6.4 oz (64.6 kg)  Height: 5' (1.524 m)   Body mass index is 27.81 kg/m.  BP Readings from Last 3 Encounters:  11/13/21 109/72  10/29/21 108/70  09/26/21 128/80    Wt Readings from Last 3 Encounters:  11/13/21  142 lb 6.4 oz (64.6 kg)  10/29/21 143 lb 6.4 oz (65 kg)  09/26/21 146 lb (66.2 kg)    Physical Exam Vitals and nursing note reviewed.  Constitutional:      Appearance: Normal appearance. She is well-developed.  HENT:     Head: Normocephalic and atraumatic.     Nose: Nose normal.     Mouth/Throat:     Mouth: Mucous membranes are moist.     Pharynx: Oropharynx is clear.  Eyes:      Extraocular Movements: Extraocular movements intact.     Conjunctiva/sclera: Conjunctivae normal.     Pupils: Pupils are equal, round, and reactive to light.  Cardiovascular:     Rate and Rhythm: Normal rate and regular rhythm.     Pulses: Normal pulses.     Heart sounds: Normal heart sounds.  Pulmonary:     Effort: Pulmonary effort is normal.     Breath sounds: Normal breath sounds.  Abdominal:     Palpations: Abdomen is soft.  Musculoskeletal:        General: Normal range of motion.     Cervical back: Normal range of motion and neck supple.  Lymphadenopathy:     Cervical: No cervical adenopathy.  Skin:    General: Skin is warm and dry.     Capillary Refill: Capillary refill takes less than 2 seconds.  Neurological:     General: No focal deficit present.     Mental Status: She is alert and oriented to person, place, and time.  Psychiatric:        Mood and Affect: Mood normal.        Behavior: Behavior normal.        Thought Content: Thought content normal.        Judgment: Judgment normal.       Assessment & Plan    1. Primary hypertension Stable. Continue bp medication as prescribed   2. BMI 28.0-28.9,adult Improving with 6 pound weight loss since her most recent visit with me. May continue daily. Discussed lowering calorie intake to 1500 calories per day and incorporating exercise into daily routine to help lose weight. Reassess in next 4-8 weeks. - phentermine (ADIPEX-P) 37.5 MG tablet; Take 1 tablet (37.5 mg total) by mouth daily before breakfast.  Dispense: 30 tablet; Refill: 1  3. Screening for colon cancer Order placed for Cologuard screening for colon cancer - Cologuard   Problem List Items Addressed This Visit       Cardiovascular and Mediastinum   Primary hypertension - Primary     Other   BMI 28.0-28.9,adult   Relevant Medications   phentermine (ADIPEX-P) 37.5 MG tablet   Other Visit Diagnoses     Screening for colon cancer       Relevant  Orders   Cologuard        Return in about 6 weeks (around 12/25/2021) for routine - weight management - can be anywhere from 4 to 8 weeks .         Ronnell Freshwater, NP  [facility] Primary Care at [facility] [phone] (phone) [phone] (fax)  [place]

## 2021-11-14 ENCOUNTER — Other Ambulatory Visit: Payer: Self-pay | Admitting: Nurse Practitioner

## 2021-11-14 DIAGNOSIS — F330 Major depressive disorder, recurrent, mild: Secondary | ICD-10-CM

## 2021-11-18 ENCOUNTER — Other Ambulatory Visit: Payer: Self-pay | Admitting: Nurse Practitioner

## 2021-11-18 DIAGNOSIS — I10 Essential (primary) hypertension: Secondary | ICD-10-CM

## 2021-11-21 DIAGNOSIS — Z1211 Encounter for screening for malignant neoplasm of colon: Secondary | ICD-10-CM | POA: Diagnosis not present

## 2021-11-28 LAB — COLOGUARD: COLOGUARD: NEGATIVE

## 2021-11-29 ENCOUNTER — Ambulatory Visit: Payer: BC Managed Care – PPO | Admitting: Physician Assistant

## 2021-11-29 ENCOUNTER — Encounter: Payer: Self-pay | Admitting: Physician Assistant

## 2021-11-29 VITALS — BP 112/74 | HR 84 | Resp 18 | Ht 60.0 in | Wt 142.0 lb

## 2021-11-29 DIAGNOSIS — B070 Plantar wart: Secondary | ICD-10-CM

## 2021-11-29 NOTE — Patient Instructions (Addendum)
  Plantar Warts Plantar warts are small growths on the bottom of the foot (sole). Warts are caused by a type of germ (virus). Most warts are not painful, and they usually do not cause problems. Sometimes, plantar warts can cause pain when you walk. Warts often go away on their own in time. They can also spread to other areas of the body. Treatments may be done if needed. What are the causes? Plantar warts are caused by a germ that is called human papillomavirus (HPV). Walking barefoot can cause exposure to the germ, especially if your feet are wet. Warts happen when HPV attacks a break in the skin of the foot. What increases the risk? Being between 10 and 20 years of age. Using public showers or locker rooms. Having a weakened body defense system (immune system). What are the signs or symptoms?  Flat or slightly raised growths that have a rough surface and look like a callus. Pain when you use your foot to support your body weight. How is this treated? In many cases, warts do not need treatment. Without treatment, they often go away with time. If treatment is needed or wanted, options may include: Applying medicated solutions, creams, or patches to the wart. These make the skin soft so that layers will slowly shed away. Freezing the wart with liquid nitrogen (cryotherapy). Burning the wart with: Laser treatment. An electrified probe (electrocautery). Injecting a medicine (Candida antigen) into the wart to help the body's defense system fight off the wart. Having surgery to remove the wart. Putting duct tape over the top of the wart (occlusion). You will leave the tape in place for as long as told by your doctor. Then you will replace it with a new strip of tape. This is done until the wart goes away. Repeat treatment may be needed if you choose to remove warts. Warts sometimes go away and come back again. Follow these instructions at home: General instructions Apply creams or  solutions only as told by your doctor. Follow these steps if your doctor tells you to do so: Soak your foot in warm water. Remove the top layer of softened skin before you apply the medicine. You can use a pumice stone to remove the skin. After you apply the medicine, put a bandage over the area of the wart. Repeat the process every day or as told by your doctor. Do not scratch or pick at a wart. Wash your hands after you touch a wart. If a wart hurts, try covering it with a bandage that has a hole in the middle. Keep all follow-up visits as told by your doctor. This is important. How is this prevented?  Wear shoes and socks. Change your socks every day. Keep your feet clean and dry. Check your feet often. Do not walk barefoot in: Shared locker rooms. Shower areas. Swimming pools. Avoid direct contact with warts on other people. Contact a doctor if: Your warts do not improve after treatment. You have redness, swelling, or pain at the site of a wart. You have bleeding from a wart, and the bleeding does not stop when you put light pressure on the wart. You have diabetes and you get a wart. Summary Warts are small growths on the skin. When warts happen on the bottom of the foot (sole), they are called plantar warts. In many cases, warts do not need treatment. Apply creams or solutions only as told by your doctor. Do not scratch or pick at a wart. Wash   your hands after you touch a wart. This information is not intended to replace advice given to you by your health care provider. Make sure you discuss any questions you have with your health care provider. Document Revised: 04/27/2020 Document Reviewed: 04/27/2020 Elsevier Patient Education  2023 Elsevier Inc.  

## 2021-11-29 NOTE — Progress Notes (Signed)
Established patient acute visit   Patient: Monica Reed   DOB: 1970/05/19   51 y.o. Female  MRN: [id number] Visit Date: 11/29/2021  Chief Complaint  Patient presents with   Cyst    Bilateral feet on the bottom   Subjective    HPI HPI     Cyst    Additional comments: Bilateral feet on the bottom      Last edited by Gemma Payor, CMA on 11/29/2021 10:31 AM.      Patient presents with c/o hard bump underneath right foot which has been more bothersome and painful. States after standing or sitting for a period of time it hurts. Feels like she has one developing on her left foot too. No swelling, fall or injury. No numbness of tingling sensation. Bump has been present for 2-3 weeks    Medications: Outpatient Medications Prior to Visit  Medication Sig   ergocalciferol (DRISDOL) 1.25 MG (50000 UT) capsule Take 1 capsule (50,000 Units total) by mouth once a week.   FLUoxetine (PROZAC) 20 MG capsule TAKE 3 CAPSULES BY MOUTH EVERY DAY   gabapentin (NEURONTIN) 100 MG capsule Take 1-3 capsules (100-300 mg total) by mouth 3 (three) times daily as needed.   lisinopril-hydrochlorothiazide (ZESTORETIC) 20-12.5 MG tablet TAKE 1 TABLET BY MOUTH EVERY DAY   loratadine (CLARITIN) 10 MG tablet Take 10 mg by mouth daily.   methocarbamol (ROBAXIN) 500 MG tablet Take 1 tablet (500 mg total) by mouth every 8 (eight) hours as needed for muscle spasms.   norethindrone-ethinyl estradiol (LOESTRIN FE) 1-20 MG-MCG tablet Take 1 tablet by mouth daily.   phentermine (ADIPEX-P) 37.5 MG tablet Take 1 tablet (37.5 mg total) by mouth daily before breakfast.   rosuvastatin (CRESTOR) 5 MG tablet TAKE 1 TABLET (5 MG TOTAL) BY MOUTH DAILY.   No facility-administered medications prior to visit.    Review of Systems Review of Systems:  A fourteen system review of systems was performed and found to be positive as per HPI.  Last CBC Lab Results  Component Value Date   WBC 7.1 07/23/2021   HGB 14.8  07/23/2021   HCT 44.0 07/23/2021   MCV 90 07/23/2021   MCH 30.2 07/23/2021   RDW 12.2 07/23/2021   PLT 356 79/03/8331   Last metabolic panel Lab Results  Component Value Date   GLUCOSE 77 07/23/2021   NA 138 07/23/2021   K 4.1 07/23/2021   CL 101 07/23/2021   CO2 24 07/23/2021   BUN 11 07/23/2021   CREATININE 0.72 07/23/2021   EGFR 101 07/23/2021   CALCIUM 9.3 07/23/2021   PROT 6.7 07/23/2021   ALBUMIN 4.3 07/23/2021   LABGLOB 2.4 07/23/2021   AGRATIO 1.8 07/23/2021   BILITOT 0.4 07/23/2021   ALKPHOS 65 07/23/2021   AST 14 07/23/2021   ALT 10 07/23/2021   Last lipids Lab Results  Component Value Date   CHOL 199 07/23/2021   HDL 57 07/23/2021   LDLCALC 125 (H) 07/23/2021   TRIG 94 07/23/2021   CHOLHDL 3.5 07/23/2021   Last hemoglobin A1c Lab Results  Component Value Date   HGBA1C 5.6 07/23/2021   Last thyroid functions Lab Results  Component Value Date   TSH 2.130 07/23/2021   Last vitamin D Lab Results  Component Value Date   VD25OH 16.1 (L) 07/23/2021     Objective    BP 112/74 (BP Location: Left Arm, Patient Position: Sitting, Cuff Size: Normal)   Pulse 84   Resp 18  Ht 5' (1.524 m)   Wt 142 lb (64.4 kg)   SpO2 99%   BMI 27.73 kg/m    Physical Exam  General:  Pleasant and cooperative, appropriate for stated age.  Neuro:  Alert and oriented,  extra-ocular muscles intact  HEENT:  Normocephalic, atraumatic, neck supple  Skin:  warm, pink. Respiratory: Speaking in full sentences, unlabored. Extremities: firm and rough growth underneath both feet  Vascular:  Ext warm, no cyanosis apprec.; cap RF less 2 sec. Psych:  No HI/SI, judgement and insight good, Euthymic mood. Full Affect.   No results found for any visits on 11/29/21.  Assessment & Plan     Discussed with patient has s/sx consistent with plantar wart. Patient will try OTC salicylic acid plantar foot pads x 3-4 weeks. If symptoms fail to improve or worsen then recommend referral to  podiatry. Pt verbalized understanding.   Return if symptoms worsen or fail to improve.        Lorrene Reid, PA-C  [facility] Health Primary Care at [facility] [phone] (phone) [phone] (fax)  [place]

## 2021-12-03 ENCOUNTER — Other Ambulatory Visit: Payer: Self-pay | Admitting: Nurse Practitioner

## 2021-12-03 DIAGNOSIS — Z6828 Body mass index (BMI) 28.0-28.9, adult: Secondary | ICD-10-CM

## 2021-12-25 ENCOUNTER — Ambulatory Visit: Payer: BC Managed Care – PPO | Admitting: Nurse Practitioner

## 2021-12-25 DIAGNOSIS — Z6827 Body mass index (BMI) 27.0-27.9, adult: Secondary | ICD-10-CM | POA: Diagnosis not present

## 2021-12-25 DIAGNOSIS — Z01419 Encounter for gynecological examination (general) (routine) without abnormal findings: Secondary | ICD-10-CM | POA: Diagnosis not present

## 2021-12-28 NOTE — Progress Notes (Signed)
Great news! Your Cologuard test is negative.  Recommend repeat colon cancer screening in 3 years.

## 2021-12-31 ENCOUNTER — Other Ambulatory Visit: Payer: Self-pay | Admitting: Physician Assistant

## 2021-12-31 ENCOUNTER — Ambulatory Visit
Admission: RE | Admit: 2021-12-31 | Discharge: 2021-12-31 | Disposition: A | Discharge status: Home / Self Care | Payer: Worker's Compensation | Source: Ambulatory Visit | Attending: Physician Assistant | Admitting: Physician Assistant

## 2021-12-31 DIAGNOSIS — S8992XA Unspecified injury of left lower leg, initial encounter: Secondary | ICD-10-CM | POA: Diagnosis present

## 2021-12-31 DIAGNOSIS — S79911A Unspecified injury of right hip, initial encounter: Secondary | ICD-10-CM

## 2022-01-25 DIAGNOSIS — Z1231 Encounter for screening mammogram for malignant neoplasm of breast: Secondary | ICD-10-CM | POA: Diagnosis not present

## 2022-01-27 NOTE — Progress Notes (Unsigned)
Established patient visit   Patient: Monica Reed   DOB: [birth year]   52 y.o. Female  MRN: [id number] Visit Date: 01/28/2022   No chief complaint on file.  Subjective    HPI  Weight management follow up -restarted phentermine at recent visit  -initial weight 6/15/2/023 - 151 pounds  -most recent weight  11/13/2021 - 142 pounds  -today's weight - 01/28/2022 -  -weight loss since last visit - -total weight loss since restarting phentermine -  12 pounds  -negative side effects from taking phentermine - she denies negative side effects    Medications: Outpatient Medications Prior to Visit  Medication Sig   ergocalciferol (DRISDOL) 1.25 MG (50000 UT) capsule Take 1 capsule (50,000 Units total) by mouth once a week.   FLUoxetine (PROZAC) 20 MG capsule TAKE 3 CAPSULES BY MOUTH EVERY DAY   gabapentin (NEURONTIN) 100 MG capsule Take 1-3 capsules (100-300 mg total) by mouth 3 (three) times daily as needed.   lisinopril-hydrochlorothiazide (ZESTORETIC) 20-12.5 MG tablet TAKE 1 TABLET BY MOUTH EVERY DAY   loratadine (CLARITIN) 10 MG tablet Take 10 mg by mouth daily.   methocarbamol (ROBAXIN) 500 MG tablet Take 1 tablet (500 mg total) by mouth every 8 (eight) hours as needed for muscle spasms.   norethindrone-ethinyl estradiol (LOESTRIN FE) 1-20 MG-MCG tablet Take 1 tablet by mouth daily.   phentermine (ADIPEX-P) 37.5 MG tablet TAKE 1 TABLET BY MOUTH EVERY DAY BEFORE BREAKFAST   rosuvastatin (CRESTOR) 5 MG tablet TAKE 1 TABLET (5 MG TOTAL) BY MOUTH DAILY.   No facility-administered medications prior to visit.    Review of Systems  {Labs (Optional):23779}   Objective    There were no vitals filed for this visit. There is no height or weight on file to calculate BMI.  BP Readings from Last 3 Encounters:  11/29/21 112/74  11/13/21 109/72  10/29/21 108/70    Wt Readings from Last 3 Encounters:  11/29/21 142 lb (64.4 kg)  11/13/21 142 lb 6.4 oz (64.6 kg)  10/29/21 143 lb 6.4  oz (65 kg)    Physical Exam  ***  No results found for any visits on 01/28/22.  Assessment & Plan     Problem List Items Addressed This Visit   None    No follow-ups on file.         Ronnell Freshwater, NP  [facility] Health Primary Care at [facility] [phone] (phone) [phone] (fax)  [place]

## 2022-01-28 ENCOUNTER — Ambulatory Visit: Payer: BC Managed Care – PPO | Admitting: Nurse Practitioner

## 2022-01-28 ENCOUNTER — Encounter: Payer: Self-pay | Admitting: Nurse Practitioner

## 2022-01-28 VITALS — BP 130/84 | HR 87 | Resp 18 | Ht 60.0 in | Wt 138.0 lb

## 2022-01-28 DIAGNOSIS — I10 Essential (primary) hypertension: Secondary | ICD-10-CM

## 2022-01-28 DIAGNOSIS — Z6826 Body mass index (BMI) 26.0-26.9, adult: Secondary | ICD-10-CM | POA: Diagnosis not present

## 2022-01-28 MED ORDER — PHENTERMINE HCL 37.5 MG PO TABS: 37.5000 mg | ORAL_TABLET | Freq: Every day | ORAL | 1 refills | Status: DC

## 2022-02-08 ENCOUNTER — Other Ambulatory Visit: Payer: Self-pay | Admitting: Nurse Practitioner

## 2022-02-08 DIAGNOSIS — E559 Vitamin D deficiency, unspecified: Secondary | ICD-10-CM

## 2022-03-08 ENCOUNTER — Other Ambulatory Visit: Payer: Self-pay | Admitting: Nurse Practitioner

## 2022-03-08 DIAGNOSIS — Z6826 Body mass index (BMI) 26.0-26.9, adult: Secondary | ICD-10-CM

## 2022-04-02 ENCOUNTER — Ambulatory Visit: Payer: BC Managed Care – PPO | Admitting: Nurse Practitioner

## 2022-04-02 ENCOUNTER — Encounter: Payer: Self-pay | Admitting: Nurse Practitioner

## 2022-04-02 VITALS — BP 137/83 | HR 86 | Ht 60.0 in | Wt 134.1 lb

## 2022-04-02 DIAGNOSIS — I10 Essential (primary) hypertension: Secondary | ICD-10-CM | POA: Diagnosis not present

## 2022-04-02 DIAGNOSIS — E782 Mixed hyperlipidemia: Secondary | ICD-10-CM

## 2022-04-02 DIAGNOSIS — Z6826 Body mass index (BMI) 26.0-26.9, adult: Secondary | ICD-10-CM | POA: Diagnosis not present

## 2022-04-02 DIAGNOSIS — F40243 Fear of flying: Secondary | ICD-10-CM | POA: Diagnosis not present

## 2022-04-02 MED ORDER — PHENTERMINE HCL 37.5 MG PO TABS: 37.5000 mg | ORAL_TABLET | Freq: Every day | ORAL | 0 refills | Status: DC

## 2022-04-02 MED ORDER — ALPRAZOLAM 0.5 MG PO TABS: ORAL_TABLET | ORAL | 0 refills | Status: DC

## 2022-04-02 NOTE — Progress Notes (Signed)
Established patient visit   Patient: Monica Reed   DOB: [birth year]   52 y.o. Female  MRN: [id number] Visit Date: 04/02/2022   Chief Complaint  Patient presents with   Weight Check   Subjective    HPI  Weight management follow up -restarted phentermine at recent visit  -initial weight 6/15/2/023 - 151 pounds  -most recent weight  01/28/2022 - 138 pounds  -today's weight - 04/01/2022 - 134 -weight loss since last visit - 4 pounds  -total weight loss since restarting phentermine -  20 pounds  -negative side effects from taking phentermine - she denies negative side effects  -she states that her goal weight is 130 pounds.  -she is happy that she has almost made it to her goal weight.   She is getting ready to fly to [place] for a family trip to [place] world.  -very scared of flying. Has not flown since she was 52 years old  -just going over high bridges makes her have panic attack.   --sweats, nausea, and palpitations are among the symptoms she has.   Medications: Outpatient Medications Prior to Visit  Medication Sig   FLUoxetine (PROZAC) 20 MG capsule TAKE 3 CAPSULES BY MOUTH EVERY DAY   gabapentin (NEURONTIN) 100 MG capsule Take 1-3 capsules (100-300 mg total) by mouth 3 (three) times daily as needed.   lisinopril-hydrochlorothiazide (ZESTORETIC) 20-12.5 MG tablet TAKE 1 TABLET BY MOUTH EVERY DAY   loratadine (CLARITIN) 10 MG tablet Take 10 mg by mouth daily.   methocarbamol (ROBAXIN) 500 MG tablet Take 1 tablet (500 mg total) by mouth every 8 (eight) hours as needed for muscle spasms.   norethindrone-ethinyl estradiol (LOESTRIN FE) 1-20 MG-MCG tablet Take 1 tablet by mouth daily.   rosuvastatin (CRESTOR) 5 MG tablet TAKE 1 TABLET (5 MG TOTAL) BY MOUTH DAILY.   Vitamin D, Ergocalciferol, (DRISDOL) 1.25 MG (50000 UNIT) CAPS capsule TAKE 1 CAPSULE BY MOUTH ONE TIME PER WEEK   [DISCONTINUED] phentermine (ADIPEX-P) 37.5 MG tablet TAKE 1 TABLET BY MOUTH DAILY BEFORE BREAKFAST    No facility-administered medications prior to visit.    Review of Systems See HPI      Objective     Today's Vitals   04/02/22 1036  BP: 137/83  Pulse: 86  SpO2: 98%  Weight: 134 lb 1.9 oz (60.8 kg)  Height: 5' (1.524 m)   Body mass index is 26.19 kg/m.  BP Readings from Last 3 Encounters:  04/02/22 137/83  01/28/22 130/84  11/29/21 112/74    Wt Readings from Last 3 Encounters:  04/02/22 134 lb 1.9 oz (60.8 kg)  01/28/22 138 lb (62.6 kg)  11/29/21 142 lb (64.4 kg)    Physical Exam Vitals and nursing note reviewed.  Constitutional:      Appearance: Normal appearance. She is well-developed.  HENT:     Head: Normocephalic and atraumatic.     Nose: Nose normal.     Mouth/Throat:     Mouth: Mucous membranes are moist.     Pharynx: Oropharynx is clear.  Eyes:     Extraocular Movements: Extraocular movements intact.     Conjunctiva/sclera: Conjunctivae normal.     Pupils: Pupils are equal, round, and reactive to light.  Neck:     Vascular: No carotid bruit.  Cardiovascular:     Rate and Rhythm: Normal rate and regular rhythm.     Pulses: Normal pulses.     Heart sounds: Normal heart sounds.  Pulmonary:     Effort: Pulmonary  effort is normal.     Breath sounds: Normal breath sounds.  Abdominal:     Palpations: Abdomen is soft.  Musculoskeletal:        General: Normal range of motion.     Cervical back: Normal range of motion and neck supple.  Lymphadenopathy:     Cervical: No cervical adenopathy.  Skin:    General: Skin is warm and dry.     Capillary Refill: Capillary refill takes less than 2 seconds.  Neurological:     General: No focal deficit present.     Mental Status: She is alert and oriented to person, place, and time.  Psychiatric:        Mood and Affect: Mood normal.        Behavior: Behavior normal.        Thought Content: Thought content normal.        Judgment: Judgment normal.      Assessment & Plan     Problem List Items Addressed  This Visit       Cardiovascular and Mediastinum   Primary hypertension    BP slightly over goal 137/83, HR 86 Continue Linsinopril/HCTZ 20/12.5mg  QD Limit salt intake daily and increase water.  Reassess in 2 months         Other   BMI 26.0-26.9,adult    Continues with weight loss on phentermine. May continue to take daily. Continue with low calorie, high-protein diet. Incorporate exercise into daily activities.        Mixed hyperlipidemia    Continue crestor as prescribed       Fear of flying - Primary   Relevant Medications   ALPRAZolam (XANAX) 0.5 MG tablet     Return in about 2 months (around 06/02/2022) for routine - weight management, blood pressure.         Carlean Jews, NP  [facility] Health Primary Care at [facility] [phone] (phone) [phone] (fax)  [facility]

## 2022-04-10 ENCOUNTER — Other Ambulatory Visit: Payer: Self-pay | Admitting: Nurse Practitioner

## 2022-04-10 DIAGNOSIS — Z6826 Body mass index (BMI) 26.0-26.9, adult: Secondary | ICD-10-CM

## 2022-05-05 DIAGNOSIS — F40243 Fear of flying: Secondary | ICD-10-CM | POA: Insufficient documentation

## 2022-05-05 NOTE — Assessment & Plan Note (Signed)
>>  ASSESSMENT AND PLAN FOR BMI 26.0-26.9,ADULT WRITTEN ON 05/05/2022  2:26 PM BY BOSCIA, HEATHER E, NP  Continues with weight loss on phentermine. May continue to take daily. Continue with low calorie, high-protein diet. Incorporate exercise into daily activities.

## 2022-05-05 NOTE — Assessment & Plan Note (Signed)
BP slightly over goal 137/83, HR 86 Continue Linsinopril/HCTZ 20/12.5mg  QD Limit salt intake daily and increase water.  Reassess in 2 months

## 2022-05-05 NOTE — Assessment & Plan Note (Signed)
Continue crestor as prescribed

## 2022-05-05 NOTE — Assessment & Plan Note (Signed)
Continues with weight loss on phentermine. May continue to take daily. Continue with low calorie, high-protein diet. Incorporate exercise into daily activities.

## 2022-06-06 ENCOUNTER — Ambulatory Visit: Payer: BC Managed Care – PPO | Admitting: Nurse Practitioner

## 2022-06-23 ENCOUNTER — Other Ambulatory Visit: Payer: Self-pay | Admitting: Family Medicine

## 2022-06-24 NOTE — Telephone Encounter (Signed)
Rx refill request approved per Dr. Corey's orders. 

## 2022-08-15 ENCOUNTER — Ambulatory Visit: Payer: BC Managed Care – PPO | Admitting: Family Medicine

## 2022-08-15 ENCOUNTER — Encounter: Payer: Self-pay | Admitting: Family Medicine

## 2022-08-15 ENCOUNTER — Other Ambulatory Visit: Payer: Self-pay

## 2022-08-15 VITALS — BP 128/80 | HR 80 | Ht 60.0 in | Wt 146.4 lb

## 2022-08-15 DIAGNOSIS — M7701 Medial epicondylitis, right elbow: Secondary | ICD-10-CM

## 2022-08-15 DIAGNOSIS — M79601 Pain in right arm: Secondary | ICD-10-CM | POA: Diagnosis not present

## 2022-08-15 MED ORDER — PREDNISONE 50 MG PO TABS
50.0000 mg | ORAL_TABLET | Freq: Every day | ORAL | 0 refills | Status: DC
Start: 2022-08-15 — End: 2022-10-08

## 2022-08-15 NOTE — Patient Instructions (Addendum)
Thank you for coming in today.   Try a wrist strap or a thumb loop wrist brace.   Take the prednisone for 5 days.   [facility]  Ok to use voltaren gel.

## 2022-08-15 NOTE — Progress Notes (Signed)
I, Stevenson Clinch, CMA acting as a scribe for Monica Graham, MD.  Monica Reed is a 52 y.o. female who presents to [facility] today for worsening R arm pain. Pt was last seen by Dr. Denyse Amass on 07/31/21 for R-sided cervical radiculopathy and a MRI was ordered and she was prescribed gabapentin. Based on MRI findings a cervical ESI was ordered, but was never performed.  Today, pt reports worsening right arm pain. Now, pain is bothersome at work. Having difficulty closing lids and wiping tables. Continues to have n/t. Sx causing night disturbance. Tinging sensation at medial aspect of the elbow. Taking Gabapentin at bedtime, causing her to feel groggy the next morning but takes the edge off the pain. Unable to take while working d/t drowsiness. Has also been taking tylenol and Advil with minimal relief.   Patient has pain primarily located at the medial epicondyles area and is worse with wrist grip activities.  She has a little bit of numb tingling sensation radiating down her arm to her pinky finger.  Dx imaging: 08/15/21 C-spine MRI  05/25/21 C-spine & R shoulder XR  Pertinent review of systems: No fevers or chills  Relevant historical information: Hypertension   Exam:  BP 128/80   Pulse 80   Ht 5' (1.524 m)   Wt 146 lb 6.4 oz (66.4 kg)   SpO2 98%   BMI 28.59 kg/m  General: Well Developed, well nourished, and in no acute distress.   MSK: C-spine normal cervical motion.  Nontender.  Right elbow normal appearing Normal motion. Tender palpation medial epicondyle.  Pain present with resisted wrist flexion and pronation. Positive Tinel's at cubital tunnel. Pulses cap refill and sensation are intact distally. Elbow strength is intact flexion and extension.    Lab and Radiology Results  Diagnostic Limited MSK Ultrasound of: Right medial elbow No tear is present within the common flexor tendon origin.  Increased vascular activity present in this  area. Impression: Medial epicondylitis      Assessment and Plan: 52 y.o. female with right medial elbow pain thought to be due to medial epicondylitis. Plan to refer to occupational therapy.  She lives in [place] so we will use the [facility] clinic. Recommend Voltaren gel and wrist strap or thumb lift with work. We talked about injection versus prednisone today.  Will try course of oral prednisone.   PDMP not reviewed this encounter. Orders Placed This Encounter  Procedures   [place] LIMITED JOINT SPACE STRUCTURES UP RIGHT(NO LINKED CHARGES)    Order Specific Question:   Reason for Exam (SYMPTOM  OR DIAGNOSIS REQUIRED)    Answer:   right arm/elbow pain    Order Specific Question:   Preferred imaging location?    Answer:   [facility] referral to Occupational Therapy    Referral Priority:   Routine    Referral Type:   Occupational Therapy    Referral Reason:   Specialty Services Required    Requested Specialty:   Occupational Therapy    Number of Visits Requested:   1   Meds ordered this encounter  Medications   predniSONE (DELTASONE) 50 MG tablet    Sig: Take 1 tablet (50 mg total) by mouth daily.    Dispense:  5 tablet    Refill:  0     Discussed warning signs or symptoms. Please see discharge instructions. Patient expresses understanding.   The above documentation has been reviewed and  is accurate and complete Monica Reed, M.D.

## 2022-09-05 ENCOUNTER — Other Ambulatory Visit: Payer: Self-pay | Admitting: Nurse Practitioner

## 2022-09-05 ENCOUNTER — Ambulatory Visit: Payer: BC Managed Care – PPO | Attending: Family Medicine | Admitting: Occupational Therapy

## 2022-09-05 ENCOUNTER — Encounter: Payer: Self-pay | Admitting: Occupational Therapy

## 2022-09-05 DIAGNOSIS — M25521 Pain in right elbow: Secondary | ICD-10-CM | POA: Diagnosis not present

## 2022-09-05 DIAGNOSIS — M6281 Muscle weakness (generalized): Secondary | ICD-10-CM | POA: Diagnosis not present

## 2022-09-05 DIAGNOSIS — E559 Vitamin D deficiency, unspecified: Secondary | ICD-10-CM

## 2022-09-05 DIAGNOSIS — M7701 Medial epicondylitis, right elbow: Secondary | ICD-10-CM | POA: Diagnosis not present

## 2022-09-05 NOTE — Therapy (Signed)
OUTPATIENT OCCUPATIONAL THERAPY ORTHO EVALUATION  Patient Name: Monica Reed MRN: [id number] DOB:Feb 14, 1970, 52 y.o., female Today's Date: 09/05/2022  PCP: Dr Jairo Ben REFERRING PROVIDER: Dr Denyse Amass  END OF SESSION:  OT End of Session - 09/05/22 1809     Visit Number 1    Number of Visits 12    Date for OT Re-Evaluation 10/31/22    OT Start Time 1410    OT Stop Time 1503    OT Time Calculation (min) 53 min    Activity Tolerance Patient tolerated treatment well    Behavior During Therapy [facility] for tasks assessed/performed             Past Medical History:  Diagnosis Date   Frequent UTI    Hypertension    History reviewed. No pertinent surgical history. 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   Degenerative tear of acetabular labrum of right hip 12/21/2020   Right hip pain 11/13/2020   DDD (degenerative disc disease), lumbar 10/10/2020   Mixed hyperlipidemia 05/16/2020   Vitamin D deficiency 04/30/2020   BMI 28.0-28.9,adult 04/30/2020   Other fatigue 04/30/2020   Hematuria 02/23/2019   Elevated LDL cholesterol level 09/29/2018   Otalgia of right ear 09/09/2018   Mass of arm, right 06/23/2018   Strain of calf muscle 09/18/2017   BMI 26.0-26.9,adult 09/02/2017   Healthcare maintenance 06/03/2017   Migraine 05/29/2017   Mild recurrent major depression (HCC) 04/24/2017   Anxiety 12/23/2014   Primary hypertension 03/26/2013    ONSET DATE: 6 month ago  REFERRING DIAG: R medial epicondylitis   THERAPY DIAG:  Medial epicondylitis of right elbow  Pain in right elbow  Muscle weakness (generalized)  Rationale for Evaluation and Treatment: Rehabilitation  SUBJECTIVE:   SUBJECTIVE STATEMENT: This pain started more than 6 months ago.  It just got worse  over the last few months.  It is limiting now me in my job.  Lifting, pulling, even wiping and bathing and holding my grandbaby Pt accompanied by: self  PERTINENT HISTORY:DR Corey's note 08/15/22: 52 y.o. female with right medial elbow pain thought to be due to medial epicondylitis. Plan to refer to occupational therapy.  She lives in [place] so we will use the [facility]. Recommend Voltaren gel and wrist strap or thumb lift with work. We talked about injection versus prednisone today.  Will try course of oral prednisone.  PRECAUTIONS: None     WEIGHT BEARING RESTRICTIONS: No  PAIN:  Are you having pain? 8/10 pain coming at R elbow - increase to over 10/10 pain -do not get better- with prednisone did decrease to 5/10 but now up again  FALLS: Has patient fallen in last 6 months? No  LIVING ENVIRONMENT: Lives with: lives with their family   PLOF: never had injury or surgery to R UE- had some pain after using crutches in the past  PATIENT GOALS: I want my pain better my right arm so that I can do my job, holding a grandbaby do things around the house without pain    OBJECTIVE:   HAND DOMINANCE: Right    UPPER EXTREMITY ROM:    Active range of motion in right digits as well as elbow and wrist within normal limits.  Patient with increased pain with wrist flexion, radial and  ulnar deviation as well as supination pronation.  Increased pain with gripping and twisting.  As well as increased pain with 3-point pinch. Slight pull with pain over medial epicondyle and volar forearm with wrist extension stretch.  SENSATION: Patient report numbness and pins-and-needles at times in the fifth finger.  Patient has a positive Tinel over the cubital tunnel-but tenderness mostly at the medial epicondyle more than the cubital tunnel.  EDEMA: Patient with increased edema and inflammation on the medial elbow  COGNITION: Overall cognitive status: Within functional limits for  tasks assessed  OBSERVATIONS: Patient with tenderness over medial epicondyle.  As well as positive Tinel cubital tunnel but not tender.   TODAY'S TREATMENT:                                                                                                                              DATE:09/05/22 Patient was educated in home program.  Done contrast with patient to do at home 2-3 times a day.  Fabricated patient a elbow pad to use during the day for cushioning over medial epicondyle, and nighttime in the volar elbow or over the medial epicondyle depending on what decreases her pain. Patient was fitted with a wrist support to immobilize wrist flexors and forearm flexors at work if possible as well as during the day.  Patient was educated on the use of a counterforce strap as well as correct application to decrease pain.  Patient was educated on donning and doffing and wearing correctly. Info provided to order 1 or get one at the pharmacy. With fabricated 1 in clinic patient had less pain. Iontophoresis with dexamethasone with a medium patch at 1.8 current for 20 minutes was done on right medial epicondyle.  Patient tolerated well skin check done prior and afterwards no issues.   PATIENT EDUCATION: Education details: Findings of evaluation and home program Person educated: Patient Education method: Explanation, Demonstration, Tactile cues, Verbal cues, and Handouts Education comprehension: verbalized understanding, returned demonstration, verbal cues required, tactile cues required, and needs further education   GOALS: Goals reviewed with patient? Yes    LONG TERM GOALS: 8 wks   Resting pain at right elbow decreased to less than 2/10 for patient to discharge  wrist brace as well as initiated extended elbow stretches Baseline: 8/10 pain that can increase with use to more than 10/10 pain.  Tenderness.  Patient fitted with a wrist support brace as well as a counterforce strap to use most of the  time.  Elbow pad fabricated for cushioning over medial epicondyle and cubital tunnel no stretches initiate because of increased pain Goal status: INITIAL  2.  Pain right elbow decrease for patient to tolerate initiating strengthening with pain less than 2/10 Baseline: Resting pain 8/10 can increase to 10/10 with use.  Patient fitted with a wrist splint as well as a counterforce strap.  As well as elbow pad to wear at nighttime as well as when sitting for cushioning.  No strengthening or stretches Goal status: INITIAL  3.  Patient is right grip and prehension strength within normal limits for her age with pain less than a 2/10. Baseline: Not tested tender 8/10 pain over medial epicondyle with increased pain with grip increasing 10/10.  No stretches or stretch exercises initiated Goal status: INITIAL  4.  Patient able to hold grandbaby as well as wipe table ,bathing ,dress and cook with pain less than 2/10 Baseline: Pain at rest 8/10 that increased to 10/10 with any activities at work as well as picking up grandbaby-cannot do that anymore as well as bathing and dressing and wiping the table.  Increased pain with sweeping and cooking Goal status: INITIAL ASSESSMENT:  CLINICAL IMPRESSION: Patient present at occupational therapy evaluation with a diagnosis of right dominant hand medial epicondylitis.  Patient resting pain 8/10 with increased tenderness and pain with wrist flexors as well as supination /pronation.  Increased pain with gripping and 3-point pinch.  Pain can increase to 10/10 with use.  Patient with increased pain limiting her functional use in ADLs, IADLs as well as work.  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  Patient was educated on  donning correctly and wearing correctly of counterforce strap at work and also at home.  Fabricated elbow pad to use at nighttime for cushioning over the medial epicondyle as well as cubital tunnel.  Patient fitted with a wrist splint to  immobilize flexors to wear is much as she can.  Patient tenderness mostly at the medial epicondyle but did had a positive Tinel and report numbness and pins-and-needles at night but also during the day and fifth digit.  Did had an order for Iontophoresis with dexamethasone.  Initiated treatment today.  Patient tolerated well.  Patient can benefit from skilled OT services to decrease pain and increase motion and strength to be able to use right dominant hand in ADLs and IADLs as well as work without increase symptoms.  PERFORMANCE DEFICITS: in functional skills including ADLs, IADLs, sensation, edema, ROM, strength, pain, flexibility, and UE functional use,  , and psychosocial skills including habits and routines and behaviors.   IMPAIRMENTS: are limiting patient from ADLs, IADLs, rest and sleep, work, play, leisure, and social participation.   COMORBIDITIES: has no other co-morbidities that affects occupational performance. Patient will benefit from skilled OT to address above impairments and improve overall function.  MODIFICATION OR ASSISTANCE TO COMPLETE EVALUATION: No modification of tasks or assist necessary to complete an evaluation.  OT OCCUPATIONAL PROFILE AND HISTORY: Problem focused assessment: Including review of records relating to presenting problem.  CLINICAL DECISION MAKING: LOW - limited treatment options, no task modification necessary  REHAB POTENTIAL: Good  EVALUATION COMPLEXITY: Low      PLAN:  OT FREQUENCY: 2x/week  OT DURATION: 8 weeks  PLANNED INTERVENTIONS: therapeutic exercise, neuromuscular re-education, manual therapy, passive range of motion, splinting, ultrasound, iontophoresis, cryotherapy, contrast bath, patient/family education, and DME and/or AE instructions     CONSULTED AND AGREED WITH PLAN OF CARE: Patient      Oletta Cohn, OTR/L,CLT  09/05/2022, 6:12 PM

## 2022-09-08 ENCOUNTER — Other Ambulatory Visit: Payer: Self-pay | Admitting: Nurse Practitioner

## 2022-09-08 DIAGNOSIS — I10 Essential (primary) hypertension: Secondary | ICD-10-CM

## 2022-09-09 ENCOUNTER — Ambulatory Visit: Payer: BC Managed Care – PPO | Admitting: Occupational Therapy

## 2022-09-09 DIAGNOSIS — M6281 Muscle weakness (generalized): Secondary | ICD-10-CM

## 2022-09-09 DIAGNOSIS — M25521 Pain in right elbow: Secondary | ICD-10-CM | POA: Diagnosis not present

## 2022-09-09 DIAGNOSIS — M7701 Medial epicondylitis, right elbow: Secondary | ICD-10-CM | POA: Diagnosis not present

## 2022-09-09 NOTE — Therapy (Signed)
OUTPATIENT OCCUPATIONAL THERAPY ORTHO EVALUATION  Patient Name: Monica Reed MRN: [id number] DOB:1970-08-21, 52 y.o., female Today's Date: 09/09/2022  PCP: Dr Jairo Ben REFERRING PROVIDER: Dr Denyse Amass  END OF SESSION:  OT End of Session - 09/09/22 1717     Visit Number 2    Number of Visits 12    Date for OT Re-Evaluation 10/31/22    OT Start Time 1710    OT Stop Time 1750    OT Time Calculation (min) 40 min    Activity Tolerance Patient tolerated treatment well    Behavior During Therapy WFL for tasks assessed/performed             Past Medical History:  Diagnosis Date   Frequent UTI    Hypertension    No past surgical history on file. 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   Degenerative tear of acetabular labrum of right hip 12/21/2020   Right hip pain 11/13/2020   DDD (degenerative disc disease), lumbar 10/10/2020   Mixed hyperlipidemia 05/16/2020   Vitamin D deficiency 04/30/2020   BMI 28.0-28.9,adult 04/30/2020   Other fatigue 04/30/2020   Hematuria 02/23/2019   Elevated LDL cholesterol level 09/29/2018   Otalgia of right ear 09/09/2018   Mass of arm, right 06/23/2018   Strain of calf muscle 09/18/2017   BMI 26.0-26.9,adult 09/02/2017   Healthcare maintenance 06/03/2017   Migraine 05/29/2017   Mild recurrent major depression (HCC) 04/24/2017   Anxiety 12/23/2014   Primary hypertension 03/26/2013    ONSET DATE: 6 month ago  REFERRING DIAG: R medial epicondylitis   THERAPY DIAG:  Medial epicondylitis of right elbow  Pain in right elbow  Muscle weakness (generalized)  Rationale for Evaluation and Treatment: Rehabilitation  SUBJECTIVE:   SUBJECTIVE STATEMENT: This pain started more than 6 months ago.  It just got worse over the last  few months.  It is limiting now me in my job.  Lifting, pulling, even wiping and bathing and holding my grandbaby Pt accompanied by: self  PERTINENT HISTORY:DR Corey's note 08/15/22: 52 y.o. female with right medial elbow pain thought to be due to medial epicondylitis. Plan to refer to occupational therapy.  She lives in [place] so we will use the [facility]. Recommend Voltaren gel and wrist strap or thumb lift with work. We talked about injection versus prednisone today.  Will try course of oral prednisone.  PRECAUTIONS: None     WEIGHT BEARING RESTRICTIONS: No  PAIN:  Are you having pain? 7/10 pain coming at R elbow - has been about 7/10 since last time  FALLS: Has patient fallen in last 6 months? No  LIVING ENVIRONMENT: Lives with: lives with their family   PLOF: never had injury or surgery to R UE- had some pain after using crutches in the past  PATIENT GOALS: I want my pain better my right arm so that I can do my job, holding a grandbaby do things around the house without pain    OBJECTIVE:   HAND DOMINANCE: Right    UPPER EXTREMITY ROM:    Active range of motion in right digits as well as elbow and wrist within normal limits.  Patient with increased pain with wrist flexion, radial and ulnar deviation as well as supination pronation.  Increased pain with gripping  and twisting.  As well as increased pain with 3-point pinch. Slight pull with pain over medial epicondyle and volar forearm with wrist extension stretch.  SENSATION: Patient report numbness and pins-and-needles at times in the fifth finger.  Patient has a positive Tinel over the cubital tunnel-but tenderness mostly at the medial epicondyle more than the cubital tunnel.  EDEMA: Patient with increased edema and inflammation on the medial elbow  COGNITION: Overall cognitive status: Within functional limits for tasks assessed  OBSERVATIONS: Patient with tenderness over medial epicondyle.   As well as positive Tinel cubital tunnel but not tender.   TODAY'S TREATMENT:                                                                                                                              DATE:09/05/22 Patient was educated in home program.  Done contrast with patient to do at home 2-3 times a day.  Fabricated patient a elbow pad to use during the day for cushioning over medial epicondyle, and nighttime in the volar elbow or over the medial epicondyle depending on what decreases her pain. Patient was fitted with a wrist support to immobilize wrist flexors and forearm flexors at work if possible as well as during the day.  Patient was educated on the use of a counterforce strap as well as correct application to decrease pain.  Patient was educated on donning and doffing and wearing correctly. Info provided to order 1 or get one at the pharmacy. With fabricated 1 in clinic patient had less pain. Iontophoresis with dexamethasone with a medium patch at 1.8 current for 20 minutes was done on right medial epicondyle.  Patient tolerated well skin check done prior and afterwards no issues.  09/09/22 treatment :  contrast  8 min - alternate hot and cold decrease edema and pain  patient to do at home 2-3 times a day.  Cont to wear elbow pad to use during the day for cushioning over medial epicondyle, and nighttime in the volar elbow or over the medial epicondyle depending on what decreases her pain. Patient  to cont with wrist support to immobilize wrist flexors and forearm flexors at work if possible as well as during the day.  Pt did pick up and start wearing  counterforce strap  the last 2 days- review again  correct application to decrease pain.  Patient was educated on donning and doffing and wearing correctly. Review after Contrast -gentle AAROM for wrist flexion, ext 5 reps pain free   Iontophoresis with dexamethasone with a medium patch at 1.8 current for 20 minutes was done on right  medial epicondyle.  Patient tolerated well skin check done prior and pt to keep patch on for hour    PATIENT EDUCATION: Education details: Findings of evaluation and home program Person educated: Patient Education method: Explanation, Demonstration, Tactile cues, Verbal cues, and Handouts Education comprehension: verbalized understanding, returned demonstration, verbal cues required, tactile cues required, and needs  further education   GOALS: Goals reviewed with patient? Yes    LONG TERM GOALS: 8 wks   Resting pain at right elbow decreased to less than 2/10 for patient to discharge  wrist brace as well as initiated extended elbow stretches Baseline: 8/10 pain that can increase with use to more than 10/10 pain.  Tenderness.  Patient fitted with a wrist support brace as well as a counterforce strap to use most of the time.  Elbow pad fabricated for cushioning over medial epicondyle and cubital tunnel no stretches initiate because of increased pain Goal status: INITIAL  2.  Pain right elbow decrease for patient to tolerate initiating strengthening with pain less than 2/10 Baseline: Resting pain 8/10 can increase to 10/10 with use.  Patient fitted with a wrist splint as well as a counterforce strap.  As well as elbow pad to wear at nighttime as well as when sitting for cushioning.  No strengthening or stretches Goal status: INITIAL  3.  Patient is right grip and prehension strength within normal limits for her age with pain less than a 2/10. Baseline: Not tested tender 8/10 pain over medial epicondyle with increased pain with grip increasing 10/10.  No stretches or stretch exercises initiated Goal status: INITIAL  4.  Patient able to hold grandbaby as well as wipe table ,bathing ,dress and cook with pain less than 2/10 Baseline: Pain at rest 8/10 that increased to 10/10 with any activities at work as well as picking up grandbaby-cannot do that anymore as well as bathing and dressing and  wiping the table.  Increased pain with sweeping and cooking Goal status: INITIAL ASSESSMENT:  CLINICAL IMPRESSION: Patient present at occupational therapy evaluation with a diagnosis of right dominant hand medial epicondylitis.  Patient  at eval with resting pain 8/10 with increased tenderness and pain with wrist flexors as well as supination /pronation.  Increased pain with gripping and 3-point pinch.  Pain can increase to 10/10 with use.  Patient with increased pain limiting her functional use in ADLs, IADLs as well as work.  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   This date pt arrive with pain decrease to 7/10  Pt wearing wearing  counterforce strap at work and also at home- review again- cont with  elbow pad to use at nighttime for cushioning over the medial epicondyle as well as cubital tunnel.  Patient also to cont with a wrist splint to immobilize flexors to wear is much as she can.  Patient tenderness mostly at the medial epicondyle but did had a positive Tinel and report numbness and pins-and-needles at night but also during the day and fifth digit.  Done this date 2nd  Iontophoresis with dexamethasone.    Patient tolerated well.  Patient can benefit from skilled OT services to decrease pain and increase motion and strength to be able to use right dominant hand in ADLs and IADLs as well as work without increase symptoms.  PERFORMANCE DEFICITS: in functional skills including ADLs, IADLs, sensation, edema, ROM, strength, pain, flexibility, and UE functional use,  , and psychosocial skills including habits and routines and behaviors.   IMPAIRMENTS: are limiting patient from ADLs, IADLs, rest and sleep, work, play, leisure, and social participation.   COMORBIDITIES: has no other co-morbidities that affects occupational performance. Patient will benefit from skilled OT to address above impairments and improve overall function.  MODIFICATION OR ASSISTANCE TO COMPLETE EVALUATION: No  modification of tasks or assist necessary to complete an  evaluation.  OT OCCUPATIONAL PROFILE AND HISTORY: Problem focused assessment: Including review of records relating to presenting problem.  CLINICAL DECISION MAKING: LOW - limited treatment options, no task modification necessary  REHAB POTENTIAL: Good  EVALUATION COMPLEXITY: Low      PLAN:  OT FREQUENCY: 2x/week  OT DURATION: 8 weeks  PLANNED INTERVENTIONS: therapeutic exercise, neuromuscular re-education, manual therapy, passive range of motion, splinting, ultrasound, iontophoresis, cryotherapy, contrast bath, patient/family education, and DME and/or AE instructions     CONSULTED AND AGREED WITH PLAN OF CARE: Patient      Oletta Cohn, OTR/L,CLT  09/09/2022, 5:47 PM

## 2022-09-13 ENCOUNTER — Ambulatory Visit: Payer: BC Managed Care – PPO | Admitting: Occupational Therapy

## 2022-09-13 DIAGNOSIS — M25521 Pain in right elbow: Secondary | ICD-10-CM

## 2022-09-13 DIAGNOSIS — M6281 Muscle weakness (generalized): Secondary | ICD-10-CM | POA: Diagnosis not present

## 2022-09-13 DIAGNOSIS — M7701 Medial epicondylitis, right elbow: Secondary | ICD-10-CM | POA: Diagnosis not present

## 2022-09-13 NOTE — Therapy (Signed)
OUTPATIENT OCCUPATIONAL THERAPY ORTHO TREATMENT  Patient Name: Monica Reed MRN: [id number] DOB:1970-12-05, 52 y.o., female Today's Date: 09/13/2022  PCP: Dr Jairo Ben REFERRING PROVIDER: Dr Denyse Amass  END OF SESSION:  OT End of Session - 09/13/22 1334     Visit Number 3    Number of Visits 12    Date for OT Re-Evaluation 10/31/22    OT Start Time 1035    OT Stop Time 1117    OT Time Calculation (min) 42 min    Activity Tolerance Patient tolerated treatment well    Behavior During Therapy [facility] for tasks assessed/performed             Past Medical History:  Diagnosis Date   Frequent UTI    Hypertension    No past surgical history on file. 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   Degenerative tear of acetabular labrum of right hip 12/21/2020   Right hip pain 11/13/2020   DDD (degenerative disc disease), lumbar 10/10/2020   Mixed hyperlipidemia 05/16/2020   Vitamin D deficiency 04/30/2020   BMI 28.0-28.9,adult 04/30/2020   Other fatigue 04/30/2020   Hematuria 02/23/2019   Elevated LDL cholesterol level 09/29/2018   Otalgia of right ear 09/09/2018   Mass of arm, right 06/23/2018   Strain of calf muscle 09/18/2017   BMI 26.0-26.9,adult 09/02/2017   Healthcare maintenance 06/03/2017   Migraine 05/29/2017   Mild recurrent major depression (HCC) 04/24/2017   Anxiety 12/23/2014   Primary hypertension 03/26/2013    ONSET DATE: 6 month ago  REFERRING DIAG: R medial epicondylitis   THERAPY DIAG:  Medial epicondylitis of right elbow  Pain in right elbow  Muscle weakness (generalized)  Rationale for Evaluation and Treatment: Rehabilitation  SUBJECTIVE:   SUBJECTIVE STATEMENT: I felt really good after last time.  Did not really had any pain even at work.   Pain was better until about yesterday. Pt accompanied by: self  PERTINENT HISTORY:DR Corey's note 08/15/22: 52 y.o. female with right medial elbow pain thought to be due to medial epicondylitis. Plan to refer to occupational therapy.  She lives in [place] so we will use the [facility]. Recommend Voltaren gel and wrist strap or thumb lift with work. We talked about injection versus prednisone today.  Will try course of oral prednisone.  PRECAUTIONS: None     WEIGHT BEARING RESTRICTIONS: No  PAIN:  Are you having pain? 7/10 pain coming at R elbow since yesterday FALLS: Has patient fallen in last 6 months? No  LIVING ENVIRONMENT: Lives with: lives with their family   PLOF: never had injury or surgery to R UE- had some pain after using crutches in the past  PATIENT GOALS: I want my pain better my right arm so that I can do my job, holding a grandbaby do things around the house without pain    OBJECTIVE:   HAND DOMINANCE: Right    UPPER EXTREMITY ROM:    Active range of motion in right digits as well as elbow and wrist within normal limits.  Patient with increased pain with wrist flexion, radial and ulnar deviation as well as supination pronation.  Increased pain with gripping and twisting.  As well as increased pain with 3-point pinch. Slight pull with pain over medial epicondyle and volar forearm  with wrist extension stretch.  SENSATION: Patient report numbness and pins-and-needles at times in the fifth finger.  Patient has a positive Tinel over the cubital tunnel-but tenderness mostly at the medial epicondyle more than the cubital tunnel.  EDEMA: Patient with increased edema and inflammation on the medial elbow  COGNITION: Overall cognitive status: Within functional limits for tasks assessed  OBSERVATIONS: Patient with tenderness over medial epicondyle.  As well as positive Tinel cubital tunnel but not tender.   TODAY'S TREATMENT:                                                                                                                               DATE:09/13/22:  contrast  8 min - alternate hot and cold decrease edema and pain at R elbow patient to do at home 2-3 times a day.  Cont to wear elbow pad to use during the day for cushioning over medial epicondyle, and nighttime in the volar elbow or over the medial epicondyle depending on what decreases her pain. Patient  to cont with wrist support to immobilize wrist flexors and forearm flexors at work if possible as well as during the day.  Cont  wearing  counterforce strap   review again  correct application to decrease pain.  Patient was educated on donning and doffing and wearing correctly. While doing contrast reviewed with patient modifications for work activities. Review after Contrast -gentle stretches for forearm flexors.  Could tolerate neutral with 5 reps 5 seconds hold  Iontophoresis with dexamethasone with a medium patch at 2.0 current for 19 minutes was done on right medial epicondyle.  Patient tolerated well skin check done prior and pt to keep patch on for hour   09/09/22 treatment :  contrast  8 min - alternate hot and cold decrease edema and pain  patient to do at home 2-3 times a day.  Cont to wear elbow pad to use during the day for cushioning over medial epicondyle, and nighttime in the volar elbow or over the medial epicondyle depending on what decreases her pain. Patient  to cont with wrist support to immobilize wrist flexors and forearm flexors at work if possible as well as during the day.  Pt did pick up and start wearing  counterforce strap  the last 2 days- review again  correct application to decrease pain.  Patient was educated on donning and doffing and wearing correctly. Review after Contrast -gentle AAROM for wrist flexion, ext 5 reps pain free   Iontophoresis with dexamethasone with a medium patch at 1.8 current for 20 minutes was done on right medial  epicondyle.  Patient tolerated well skin check done prior and pt to keep patch on for hour    PATIENT EDUCATION: Education details: Findings of evaluation and home program Person educated: Patient Education method: Explanation, Demonstration, Tactile cues, Verbal cues, and Handouts Education comprehension: verbalized understanding, returned demonstration, verbal cues required, tactile cues required, and needs further education  GOALS: Goals reviewed with patient? Yes    LONG TERM GOALS: 8 wks   Resting pain at right elbow decreased to less than 2/10 for patient to discharge  wrist brace as well as initiated extended elbow stretches Baseline: 8/10 pain that can increase with use to more than 10/10 pain.  Tenderness.  Patient fitted with a wrist support brace as well as a counterforce strap to use most of the time.  Elbow pad fabricated for cushioning over medial epicondyle and cubital tunnel no stretches initiate because of increased pain Goal status: INITIAL  2.  Pain right elbow decrease for patient to tolerate initiating strengthening with pain less than 2/10 Baseline: Resting pain 8/10 can increase to 10/10 with use.  Patient fitted with a wrist splint as well as a counterforce strap.  As well as elbow pad to wear at nighttime as well as when sitting for cushioning.  No strengthening or stretches Goal status: INITIAL  3.  Patient is right grip and prehension strength within normal limits for her age with pain less than a 2/10. Baseline: Not tested tender 8/10 pain over medial epicondyle with increased pain with grip increasing 10/10.  No stretches or stretch exercises initiated Goal status: INITIAL  4.  Patient able to hold grandbaby as well as wipe table ,bathing ,dress and cook with pain less than 2/10 Baseline: Pain at rest 8/10 that increased to 10/10 with any activities at work as well as picking up grandbaby-cannot do that anymore as well as bathing and dressing and wiping  the table.  Increased pain with sweeping and cooking Goal status: INITIAL ASSESSMENT:  CLINICAL IMPRESSION: Patient present at occupational therapy evaluation with a diagnosis of right dominant hand medial epicondylitis.  Patient  at eval with resting pain 8/10 with increased tenderness and pain with wrist flexors as well as supination /pronation.  Increased pain with gripping and 3-point pinch.  Pain can increase to 10/10 with use.  Patient with increased pain limiting her functional use in ADLs, IADLs as well as work.  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   This date pt arrive with pain decrease to 7/10 -patient arrived today with reports of pain being will get the last few days.  Patient to continue wearing her counterforce strap as well as wrist brace at work and home.  Elbow pad to use at nighttime for cushioning over the medial epicondyle as well as cubital tunnel.   Patient tenderness mostly at the medial epicondyle but did had a positive Tinel and patient denies any numbness this date.  Done this date 3rd  Iontophoresis with dexamethasone.    Patient tolerated well.  Patient can benefit from skilled OT services to decrease pain and increase motion and strength to be able to use right dominant hand in ADLs and IADLs as well as work without increase symptoms.  PERFORMANCE DEFICITS: in functional skills including ADLs, IADLs, sensation, edema, ROM, strength, pain, flexibility, and UE functional use,  , and psychosocial skills including habits and routines and behaviors.   IMPAIRMENTS: are limiting patient from ADLs, IADLs, rest and sleep, work, play, leisure, and social participation.   COMORBIDITIES: has no other co-morbidities that affects occupational performance. Patient will benefit from skilled OT to address above impairments and improve overall function.  MODIFICATION OR ASSISTANCE TO COMPLETE EVALUATION: No modification of tasks or assist necessary to complete an  evaluation.  OT OCCUPATIONAL PROFILE AND HISTORY: Problem focused assessment: Including review of records relating to  presenting problem.  CLINICAL DECISION MAKING: LOW - limited treatment options, no task modification necessary  REHAB POTENTIAL: Good  EVALUATION COMPLEXITY: Low      PLAN:  OT FREQUENCY: 2x/week  OT DURATION: 8 weeks  PLANNED INTERVENTIONS: therapeutic exercise, neuromuscular re-education, manual therapy, passive range of motion, splinting, ultrasound, iontophoresis, cryotherapy, contrast bath, patient/family education, and DME and/or AE instructions     CONSULTED AND AGREED WITH PLAN OF CARE: Patient      Oletta Cohn, OTR/L,CLT  09/13/2022, 1:36 PM

## 2022-09-16 ENCOUNTER — Ambulatory Visit: Payer: BC Managed Care – PPO | Admitting: Occupational Therapy

## 2022-09-16 DIAGNOSIS — M6281 Muscle weakness (generalized): Secondary | ICD-10-CM

## 2022-09-16 DIAGNOSIS — M25521 Pain in right elbow: Secondary | ICD-10-CM

## 2022-09-16 DIAGNOSIS — M7701 Medial epicondylitis, right elbow: Secondary | ICD-10-CM | POA: Diagnosis not present

## 2022-09-19 ENCOUNTER — Encounter: Payer: BC Managed Care – PPO | Admitting: Occupational Therapy

## 2022-09-19 NOTE — Therapy (Signed)
OUTPATIENT OCCUPATIONAL THERAPY ORTHO TREATMENT  Patient Name: Monica Reed MRN: [id number] DOB:1970/05/04, 52 y.o., female   PCP: Dr Laury Deep PROVIDER: Dr Denyse Amass  END OF SESSION:  OT End of Session - 09/19/22 2226     Visit Number 4    Number of Visits 12    Date for OT Re-Evaluation 10/31/22    OT Start Time 1605    OT Stop Time 1645    OT Time Calculation (min) 40 min    Activity Tolerance Patient tolerated treatment well    Behavior During Therapy WFL for tasks assessed/performed             Past Medical History:  Diagnosis Date   Frequent UTI    Hypertension    No past surgical history on file. 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   Degenerative tear of acetabular labrum of right hip 12/21/2020   Right hip pain 11/13/2020   DDD (degenerative disc disease), lumbar 10/10/2020   Mixed hyperlipidemia 05/16/2020   Vitamin D deficiency 04/30/2020   BMI 28.0-28.9,adult 04/30/2020   Other fatigue 04/30/2020   Hematuria 02/23/2019   Elevated LDL cholesterol level 09/29/2018   Otalgia of right ear 09/09/2018   Mass of arm, right 06/23/2018   Strain of calf muscle 09/18/2017   BMI 26.0-26.9,adult 09/02/2017   Healthcare maintenance 06/03/2017   Migraine 05/29/2017   Mild recurrent major depression (HCC) 04/24/2017   Anxiety 12/23/2014   Primary hypertension 03/26/2013    ONSET DATE: 6 month ago  REFERRING DIAG: R medial epicondylitis   THERAPY DIAG:  Medial epicondylitis of right elbow  Pain in right elbow  Muscle weakness (generalized)  Rationale for Evaluation and Treatment: Rehabilitation  SUBJECTIVE:  She has been at work and using her hands a lot with [employer] don[employer] making.  She looked at ways to try to modify tasks but it is  hard to do with the types of equipment she uses and lifting of objects.   SUBJECTIVE STATEMENT:  Pt accompanied by: self  PERTINENT HISTORY:DR Corey's note 08/15/22: 52 y.o. female with right medial elbow pain thought to be due to medial epicondylitis. Plan to refer to occupational therapy.  She lives in [place] so we will use the [facility]. Recommend Voltaren gel and wrist strap or thumb lift with work. We talked about injection versus prednisone today.  Will try course of oral prednisone.  PRECAUTIONS: None  WEIGHT BEARING RESTRICTIONS: No  PAIN:  Are you having pain? 7/10 pain coming at R elbow since yesterday FALLS: Has patient fallen in last 6 months? No  LIVING ENVIRONMENT: Lives with: lives with their family   PLOF: never had injury or surgery to R UE- had some pain after using crutches in the past  PATIENT GOALS: I want my pain better my right arm so that I can do my job, holding a grandbaby do things around the house without pain  OBJECTIVE:   HAND DOMINANCE: Right  UPPER EXTREMITY ROM:    Active range of motion in right digits as well as elbow and wrist within normal limits.  Patient with increased pain with wrist flexion, radial and ulnar deviation as well as supination pronation.  Increased pain with gripping and twisting.  As well as increased pain with 3-point  pinch. Slight pull with pain over medial epicondyle and volar forearm with wrist extension stretch.  SENSATION: Patient report numbness and pins-and-needles at times in the fifth finger.  Patient has a positive Tinel over the cubital tunnel-but tenderness mostly at the medial epicondyle more than the cubital tunnel.  EDEMA: Patient with increased edema and inflammation on the medial elbow  COGNITION: Overall cognitive status: Within functional limits for tasks assessed  OBSERVATIONS: Patient with tenderness over medial epicondyle.  As well as positive Tinel cubital tunnel but not  tender.   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  She looked at ways to try to modify tasks but it is hard to do with the types of equipment she uses and lifting of objects.   Next week is busy with the holiday and will not likely get off until late.  She will come Friday when she is off.  She reports she is doing contrast only 1 time a day, discussed adding to beginning of day as well if she can fit it into her schedule.    Contrast: Contrast for 8 min - alternate hot and cold to decrease edema and pain at R elbow Recommend patient to perform at home 2-3 times a day if possible.    Following contrast:  gentle stretches for forearm flexors, 5 reps 5 seconds hold.  Discussed work modifications and alternating activities when she can, she feels it is difficult to modify some of the tasks but has been trying to find positions and ways to hold, pick up and manage items.    Iontophoresis: Iontophoresis with dexamethasone with a medium patch at 2.0 current for 19 minutes performed on right medial epicondyle.  Patient tolerated well skin check done prior and pt to keep patch on for hour after treatment.   Home program:  Cont to wear elbow pad to use during the day for cushioning over medial epicondyle, and nighttime in the volar elbow or over the medial epicondyle depending on what decreases her pain. Patient to cont with wrist support to immobilize wrist flexors and forearm flexors at work if possible as well as during the day.  Cont  wearing  counterforce strap  to decrease pain.   PATIENT EDUCATION: Education details: Findings of evaluation and home program Person educated: Patient Education method: Explanation, Demonstration, Tactile cues, Verbal cues, and Handouts Education comprehension: verbalized  understanding, returned demonstration, verbal cues required, tactile cues required, and needs further education   GOALS: Goals reviewed with patient? Yes  LONG TERM GOALS: 8 wks   Resting pain at right elbow decreased to less than 2/10 for patient to discharge  wrist brace as well as initiated extended elbow stretches Baseline: 8/10  pain that can increase with use to more than 10/10 pain.  Tenderness.  Patient fitted with a wrist support brace as well as a counterforce strap to use most of the time.  Elbow pad fabricated for cushioning over medial epicondyle and cubital tunnel no stretches initiate because of increased pain Goal status: INITIAL  2.  Pain right elbow decrease for patient to tolerate initiating strengthening with pain less than 2/10 Baseline: Resting pain 8/10 can increase to 10/10 with use.  Patient fitted with a wrist splint as well as a counterforce strap.  As well as elbow pad to wear at nighttime as well as when sitting for cushioning.  No strengthening or stretches Goal status: INITIAL  3.  Patient is right grip and prehension strength within normal limits for her age with pain less than a 2/10. Baseline: Not tested tender 8/10 pain over medial epicondyle with increased pain with grip increasing 10/10.  No stretches or stretch exercises initiated Goal status: INITIAL  4.  Patient able to hold grandbaby as well as wipe table ,bathing ,dress and cook with pain less than 2/10 Baseline: Pain at rest 8/10 that increased to 10/10 with any activities at work as well as picking up grandbaby-cannot do that anymore as well as bathing and dressing and wiping the table.  Increased pain with sweeping and cooking Goal status: INITIAL ASSESSMENT:  CLINICAL IMPRESSION: Patient present at occupational therapy evaluation with a diagnosis of right dominant hand medial epicondylitis.  Patient  at eval with resting pain 8/10 with increased tenderness and pain with wrist flexors as well as  supination /pronation.  Increased pain with gripping and 3-point pinch.  Pain can increase to 10/10 with use.  Patient with increased pain limiting her functional use in ADLs, IADLs as well as work.  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 Patient to continue wearing her counterforce strap as well as wrist brace at work and home.  Elbow pad to use at nighttime for cushioning over the medial epicondyle as well as cubital tunnel.   Patient tenderness mostly at the medial epicondyle. 4th application of Iontophoresis with dexamethasone.  Patient tolerated well.  Pt to continue to look at options to modify work tasks to decrease pain and symptoms in elbow.  Patient can benefit from skilled OT services to decrease pain and increase motion and strength to be able to use right dominant hand in ADLs and IADLs as well as work without increase symptoms.  PERFORMANCE DEFICITS: in functional skills including ADLs, IADLs, sensation, edema, ROM, strength, pain, flexibility, and UE functional use,  , and psychosocial skills including habits and routines and behaviors.   IMPAIRMENTS: are limiting patient from ADLs, IADLs, rest and sleep, work, play, leisure, and social participation.   COMORBIDITIES: has no other co-morbidities that affects occupational performance. Patient will benefit from skilled OT to address above impairments and improve overall function.  MODIFICATION OR ASSISTANCE TO COMPLETE EVALUATION: No modification of tasks or assist necessary to complete an evaluation.  OT OCCUPATIONAL PROFILE AND HISTORY: Problem focused assessment: Including review of records relating to presenting problem.  CLINICAL DECISION MAKING: LOW - limited treatment options, no task modification necessary  REHAB POTENTIAL: Good  EVALUATION COMPLEXITY: Low  PLAN:  OT FREQUENCY: 2x/week  OT DURATION: 8 weeks  PLANNED INTERVENTIONS: therapeutic exercise, neuromuscular re-education, manual therapy, passive  range of motion, splinting, ultrasound, iontophoresis, cryotherapy, contrast bath, patient/family education, and DME and/or AE instructions   CONSULTED AND AGREED WITH PLAN  OF CARE: Patient  Derrek Gu, OTR/L,CLT  09/19/2022, 10:27 PM

## 2022-09-27 ENCOUNTER — Ambulatory Visit: Payer: BC Managed Care – PPO | Admitting: Occupational Therapy

## 2022-10-07 ENCOUNTER — Ambulatory Visit: Payer: BC Managed Care – PPO | Attending: Family Medicine | Admitting: Occupational Therapy

## 2022-10-07 DIAGNOSIS — M25521 Pain in right elbow: Secondary | ICD-10-CM | POA: Insufficient documentation

## 2022-10-07 DIAGNOSIS — M7701 Medial epicondylitis, right elbow: Secondary | ICD-10-CM | POA: Insufficient documentation

## 2022-10-07 DIAGNOSIS — M6281 Muscle weakness (generalized): Secondary | ICD-10-CM | POA: Insufficient documentation

## 2022-10-07 NOTE — Therapy (Addendum)
OUTPATIENT OCCUPATIONAL THERAPY ORTHO TREATMENT  Patient Name: Monica Reed MRN: [id number] DOB:[birth year], 52 y.o., female   PCP: Dr Laury Deep PROVIDER: Dr Denyse Amass  END OF SESSION:  OT End of Session - 10/07/22 1401     Visit Number 5    Number of Visits 12    Date for OT Re-Evaluation 10/31/22    OT Start Time 1357    OT Stop Time 1444    OT Time Calculation (min) 47 min    Activity Tolerance Patient tolerated treatment well    Behavior During Therapy WFL for tasks assessed/performed             Past Medical History:  Diagnosis Date   Frequent UTI    Hypertension    No past surgical history on file. 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   Degenerative tear of acetabular labrum of right hip 12/21/2020   Right hip pain 11/13/2020   DDD (degenerative disc disease), lumbar 10/10/2020   Mixed hyperlipidemia 05/16/2020   Vitamin D deficiency 04/30/2020   BMI 28.0-28.9,adult 04/30/2020   Other fatigue 04/30/2020   Hematuria 02/23/2019   Elevated LDL cholesterol level 09/29/2018   Otalgia of right ear 09/09/2018   Mass of arm, right 06/23/2018   Strain of calf muscle 09/18/2017   BMI 26.0-26.9,adult 09/02/2017   Healthcare maintenance 06/03/2017   Migraine 05/29/2017   Mild recurrent major depression (HCC) 04/24/2017   Anxiety 12/23/2014   Primary hypertension 03/26/2013    ONSET DATE: 6 month ago  REFERRING DIAG: R medial epicondylitis   THERAPY DIAG:  Medial epicondylitis of right elbow  Pain in right elbow  Muscle weakness (generalized)  Rationale for Evaluation and Treatment: Rehabilitation  SUBJECTIVE:  I can tell pain is better if not using it but after 4-5 hrs working pain starts and also when cleaning at home -splints  helping some- numbness better in my pinkie and I can sleep better   SUBJECTIVE STATEMENT:  Pt accompanied by: self  PERTINENT HISTORY:DR Corey's note 08/15/22: 52 y.o. female with right medial elbow pain thought to be due to medial epicondylitis. Plan to refer to occupational therapy.  She lives in [place] so we will use the [facility]. Recommend Voltaren gel and wrist strap or thumb lift with work. We talked about injection versus prednisone today.  Will try course of oral prednisone.  PRECAUTIONS: None  WEIGHT BEARING RESTRICTIONS: No  PAIN:  Are you having pain? 5/10 pain when using but otherwise 0/10  if not using  FALLS: Has patient fallen in last 6 months? No  LIVING ENVIRONMENT: Lives with: lives with their family   PLOF: never had injury or surgery to R UE- had some pain after using crutches in the past  PATIENT GOALS: I want my pain better my right arm so that I can do my job, holding a grandbaby do things around the house without pain  OBJECTIVE:   HAND DOMINANCE: Right  UPPER EXTREMITY ROM:    Active range of motion in right digits as well as elbow and wrist within normal limits.  Patient with increased pain with wrist flexion, radial and ulnar deviation as well as supination pronation.  Increased pain with gripping and twisting.  As well as increased pain with 3-point pinch. Slight  pull with pain over medial epicondyle and volar forearm with wrist extension stretch.  SENSATION: Patient report numbness and pins-and-needles at times in the fifth finger.  Patient has a positive Tinel over the cubital tunnel-but tenderness mostly at the medial epicondyle more than the cubital tunnel.  EDEMA: Patient with increased edema and inflammation on the medial elbow  COGNITION: Overall cognitive status: Within functional limits for tasks assessed  OBSERVATIONS: Patient with tenderness over medial epicondyle.  As well as positive Tinel cubital tunnel but  not tender.   TODAY'S TREATMENT:                                                                                                                              DATE:10/07/2022:   We were busy at work and I could not get here in time -and then my husband had surgery - but I can tell arm is better - numbness, and resting pain and sleeping better Contrast: Contrast for 8 min - alternate hot and cold to decrease edema and pain at R elbow Recommend patient to perform at home 2-3 times a day if possible.   Soft tissue mobs to volar forearm - using graston tool nr 2 sweeping and MC spreads   Following contrast:  gentle stretches for forearm flexors, 5 reps 5 seconds hold elbow to side -and then extended arm with forearm in pronation- 5 reps 5 sec    Discussed work modifications and alternating activities when she can, she feels it is difficult to modify some of the tasks but has been trying to find positions and ways to hold, pick up and manage items.    Iontophoresis: Iontophoresis with dexamethasone with a medium patch at 2.0 current for 19 minutes performed on right medial epicondyle.  Patient tolerated well skin check done prior and pt to keep patch on for hour after treatment.   Home program:  Cont to wear elbow pad to use during the day for cushioning over medial epicondyle,  out side of work and nighttime in the volar elbow or over the medial epicondyle depending on what decreases her pain. And then counter force strap at work  Patient to cont with wrist support to immobilize wrist flexors and forearm flexors during day if not at work  Cont  wearing  counterforce strap  to decrease pain.   PATIENT EDUCATION: Education details: Findings of evaluation and home program Person educated: Patient Education method: Explanation, Demonstration, Tactile cues, Verbal cues, and Handouts Education comprehension: verbalized understanding, returned demonstration, verbal cues required, tactile cues required,  and needs further education   GOALS: Goals reviewed with patient? Yes  LONG TERM GOALS: 8 wks   Resting pain at right elbow decreased to less than 2/10 for patient to discharge  wrist brace as well as initiated extended elbow stretches Baseline: 8/10 pain that can increase with use to more than 10/10 pain.  Tenderness.  Patient fitted with a wrist support brace as  well as a counterforce strap to use most of the time.  Elbow pad fabricated for cushioning over medial epicondyle and cubital tunnel no stretches initiate because of increased pain Goal status: INITIAL  2.  Pain right elbow decrease for patient to tolerate initiating strengthening with pain less than 2/10 Baseline: Resting pain 8/10 can increase to 10/10 with use.  Patient fitted with a wrist splint as well as a counterforce strap.  As well as elbow pad to wear at nighttime as well as when sitting for cushioning.  No strengthening or stretches Goal status: INITIAL  3.  Patient is right grip and prehension strength within normal limits for her age with pain less than a 2/10. Baseline: Not tested tender 8/10 pain over medial epicondyle with increased pain with grip increasing 10/10.  No stretches or stretch exercises initiated Goal status: INITIAL  4.  Patient able to hold grandbaby as well as wipe table ,bathing ,dress and cook with pain less than 2/10 Baseline: Pain at rest 8/10 that increased to 10/10 with any activities at work as well as picking up grandbaby-cannot do that anymore as well as bathing and dressing and wiping the table.  Increased pain with sweeping and cooking Goal status: INITIAL ASSESSMENT:  CLINICAL IMPRESSION: Patient present at occupational therapy evaluation with a diagnosis of right dominant hand medial epicondylitis.  Patient  at eval with resting pain 8/10 with increased tenderness and pain with wrist flexors as well as supination /pronation.  Increased pain with gripping and 3-point pinch.  Pain can  increase to 10/10 with use.  Patient with increased pain limiting her functional use in ADLs, IADLs as well as work.  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 Patient to continue wearing her counterforce strap as well as wrist brace at work and home.  Elbow pad to use at nighttime for cushioning over the medial epicondyle as well as cubital tunnel.   Patient tenderness mostly at the medial epicondyle.  Pain decrease to 0-1/10 at rest increase at home 5/10 and work 7/10 - numbness better and night time pain better. 5th application of Iontophoresis with dexamethasone - pt was not seen for about 2-3 wks.  Patient tolerated well.  Pt to continue to look at options to modify work tasks to decrease pain and symptoms in elbow.  Patient can benefit from skilled OT services to decrease pain and increase motion and strength to be able to use right dominant hand in ADLs and IADLs as well as work without increase symptoms.  PERFORMANCE DEFICITS: in functional skills including ADLs, IADLs, sensation, edema, ROM, strength, pain, flexibility, and UE functional use,  , and psychosocial skills including habits and routines and behaviors.   IMPAIRMENTS: are limiting patient from ADLs, IADLs, rest and sleep, work, play, leisure, and social participation.   COMORBIDITIES: has no other co-morbidities that affects occupational performance. Patient will benefit from skilled OT to address above impairments and improve overall function.  MODIFICATION OR ASSISTANCE TO COMPLETE EVALUATION: No modification of tasks or assist necessary to complete an evaluation.  OT OCCUPATIONAL PROFILE AND HISTORY: Problem focused assessment: Including review of records relating to presenting problem.  CLINICAL DECISION MAKING: LOW - limited treatment options, no task modification necessary  REHAB POTENTIAL: Good  EVALUATION COMPLEXITY: Low  PLAN:  OT FREQUENCY: 2x/week  OT DURATION: 8 weeks  PLANNED INTERVENTIONS:  therapeutic exercise, neuromuscular re-education, manual therapy, passive range of motion, splinting, ultrasound, iontophoresis, cryotherapy, contrast bath, patient/family education, and DME and/or  AE instructions   CONSULTED AND AGREED WITH PLAN OF CARE: Patient  Oletta Cohn, OTR/L,CLT  10/07/2022, 2:36 PM

## 2022-10-08 ENCOUNTER — Ambulatory Visit: Payer: BC Managed Care – PPO | Admitting: Family Medicine

## 2022-10-08 ENCOUNTER — Encounter: Payer: Self-pay | Admitting: Family Medicine

## 2022-10-08 ENCOUNTER — Other Ambulatory Visit: Payer: Self-pay | Admitting: Family Medicine

## 2022-10-08 VITALS — BP 147/85 | HR 72 | Resp 20 | Ht 60.0 in | Wt 151.0 lb

## 2022-10-08 DIAGNOSIS — Z1159 Encounter for screening for other viral diseases: Secondary | ICD-10-CM

## 2022-10-08 DIAGNOSIS — I10 Essential (primary) hypertension: Secondary | ICD-10-CM | POA: Diagnosis not present

## 2022-10-08 DIAGNOSIS — E663 Overweight: Secondary | ICD-10-CM | POA: Diagnosis not present

## 2022-10-08 DIAGNOSIS — Z6829 Body mass index (BMI) 29.0-29.9, adult: Secondary | ICD-10-CM

## 2022-10-08 MED ORDER — PHENTERMINE HCL 37.5 MG PO TABS: 37.5000 mg | ORAL_TABLET | Freq: Every day | ORAL | 2 refills | Status: DC

## 2022-10-08 NOTE — Assessment & Plan Note (Signed)
Restart phentermine 37.5 mg daily.  Continue low calorie, high-protein diet and routine physical activity.  Continue ambulatory blood pressure monitoring, if consistently above goal we will need to either switch phentermine to a different medication or increase blood pressure medicine.

## 2022-10-08 NOTE — Patient Instructions (Signed)
I would recommend starting a over-the-counter vitamin D3 2000 unit daily supplement, and we will recheck your vitamin D levels with your labs in 3 months!

## 2022-10-08 NOTE — Assessment & Plan Note (Signed)
BP goal <140/90.  Blood pressure slightly above goal in office, likely secondary to steroid injection.  Recommend continuing with ambulatory blood pressure monitoring, take 2 tablets of blood pressure medication if blood pressure consistently above goal at home until it normalizes.  Otherwise, continue lisinopril-hydrochlorothiazide 20-12.5 mg daily.  Will continue to monitor.

## 2022-10-08 NOTE — Progress Notes (Signed)
Established Patient Office Visit  Subjective   Patient ID: Monica Reed, female    DOB: 11-Apr-1970  Age: 52 y.o. MRN: [id number]  Chief Complaint  Patient presents with   Weight Management Screening    HPI Monica Reed is a 52 y.o. female presenting today to discuss restarting weight loss medication.  She was previously taking phentermine but ran out of the medication at home.  It was helping her to lose weight and she got very close to her goal.  Since stopping the medication, her weight has increased back up to near her starting weight prior to using any medication.  She would like to restart phentermine.  She does note that blood pressure may be slightly elevated as she had steroid injection in her elbow yesterday.  Outpatient Medications Prior to Visit  Medication Sig   ALPRAZolam (XANAX) 0.5 MG tablet Take 1 tablet po two hours prior to flight. May take 1 tablet po one hour prior to flight PRN. May take 1 tablet po upon boarding flight PRN. May take 1 tablet po during flight if needed.   FLUoxetine (PROZAC) 20 MG capsule TAKE 3 CAPSULES BY MOUTH EVERY DAY   gabapentin (NEURONTIN) 100 MG capsule TAKE 1-3 CAPSULES (100-300 MG TOTAL) BY MOUTH 3 (THREE) TIMES DAILY AS NEEDED.   lisinopril-hydrochlorothiazide (ZESTORETIC) 20-12.5 MG tablet TAKE 1 TABLET BY MOUTH EVERY DAY   loratadine (CLARITIN) 10 MG tablet Take 10 mg by mouth daily.   norethindrone-ethinyl estradiol (LOESTRIN FE) 1-20 MG-MCG tablet Take 1 tablet by mouth daily.   rosuvastatin (CRESTOR) 5 MG tablet TAKE 1 TABLET (5 MG TOTAL) BY MOUTH DAILY.   [DISCONTINUED] methocarbamol (ROBAXIN) 500 MG tablet Take 1 tablet (500 mg total) by mouth every 8 (eight) hours as needed for muscle spasms.   [DISCONTINUED] phentermine (ADIPEX-P) 37.5 MG tablet TAKE 1 TABLET BY MOUTH EVERY DAY BEFORE BREAKFAST   [DISCONTINUED] predniSONE (DELTASONE) 50 MG tablet Take 1 tablet (50 mg total) by mouth daily. (Patient not taking:  Reported on 10/08/2022)   [DISCONTINUED] Vitamin D, Ergocalciferol, (DRISDOL) 1.25 MG (50000 UNIT) CAPS capsule TAKE 1 CAPSULE BY MOUTH ONE TIME PER WEEK (Patient not taking: Reported on 10/08/2022)   No facility-administered medications prior to visit.    ROS Negative unless otherwise noted in HPI   Objective:     BP (!) 147/85 (BP Location: Left Arm, Patient Position: Sitting, Cuff Size: Normal)   Pulse 72   Resp 20   Ht 5' (1.524 m)   Wt 151 lb (68.5 kg)   SpO2 98%   BMI 29.49 kg/m   Physical Exam Constitutional:      General: She is not in acute distress.    Appearance: Normal appearance.  HENT:     Head: Normocephalic and atraumatic.  Cardiovascular:     Rate and Rhythm: Normal rate and regular rhythm.     Heart sounds: No murmur heard.    No friction rub. No gallop.  Pulmonary:     Effort: Pulmonary effort is normal. No respiratory distress.     Breath sounds: No wheezing, rhonchi or rales.  Skin:    General: Skin is warm and dry.  Neurological:     Mental Status: She is alert and oriented to person, place, and time.     Assessment & Plan:  Overweight with body mass index (BMI) of 29 to 29.9 in adult Assessment & Plan: Restart phentermine 37.5 mg daily.  Continue low calorie, high-protein diet and routine physical  activity.  Continue ambulatory blood pressure monitoring, if consistently above goal we will need to either switch phentermine to a different medication or increase blood pressure medicine.  Orders: -     Phentermine HCl; Take 1 tablet (37.5 mg total) by mouth daily before breakfast.  Dispense: 30 tablet; Refill: 2 -     CBC with Differential/Platelet; Future -     Comprehensive metabolic panel; Future -     Hemoglobin A1c; Future -     Lipid panel; Future -     TSH Rfx on Abnormal to Free T4; Future -     VITAMIN D 25 Hydroxy (Vit-D Deficiency, Fractures); Future  Screening for viral disease -     Hepatitis C antibody; Future -     HIV Antibody  (routine testing w rflx); Future  Primary hypertension Assessment & Plan: BP goal <140/90.  Blood pressure slightly above goal in office, likely secondary to steroid injection.  Recommend continuing with ambulatory blood pressure monitoring, take 2 tablets of blood pressure medication if blood pressure consistently above goal at home until it normalizes.  Otherwise, continue lisinopril-hydrochlorothiazide 20-12.5 mg daily.  Will continue to monitor.    Return in 2 weeks for nurse blood pressure visit. Return in about 3 months (around 01/07/2023) for annual physical, fasting blood work 1 week before.    Melida Quitter, PA

## 2022-10-10 ENCOUNTER — Ambulatory Visit: Payer: BC Managed Care – PPO | Admitting: Occupational Therapy

## 2022-10-10 DIAGNOSIS — M6281 Muscle weakness (generalized): Secondary | ICD-10-CM | POA: Diagnosis not present

## 2022-10-10 DIAGNOSIS — M25521 Pain in right elbow: Secondary | ICD-10-CM | POA: Diagnosis not present

## 2022-10-10 DIAGNOSIS — M7701 Medial epicondylitis, right elbow: Secondary | ICD-10-CM | POA: Diagnosis not present

## 2022-10-10 NOTE — Therapy (Signed)
OUTPATIENT OCCUPATIONAL THERAPY ORTHO TREATMENT  Patient Name: Monica Reed MRN: [id number] DOB:15-Sep-1970, 52 y.o., female   PCP: Dr Laury Deep PROVIDER: Dr Denyse Amass  END OF SESSION:  OT End of Session - 10/10/22 1711     Visit Number 6    Number of Visits 12    Date for OT Re-Evaluation 10/31/22    OT Start Time 1621    OT Stop Time 1713    OT Time Calculation (min) 52 min    Activity Tolerance Patient tolerated treatment well    Behavior During Therapy WFL for tasks assessed/performed             Past Medical History:  Diagnosis Date   Frequent UTI    Hypertension    No past surgical history on file. Patient Active Problem List   Diagnosis Date Noted   Fear of flying 05/05/2022   Cervical disc disease 07/15/2021   Fredrickson type IIa hyperlipoproteinemia 05/10/2021   Degenerative tear of acetabular labrum of right hip 12/21/2020   DDD (degenerative disc disease), lumbar 10/10/2020   Vitamin D deficiency 04/30/2020   Hyperlipidemia 09/29/2018   Mass of arm, right 06/23/2018   Overweight with body mass index (BMI) of 29 to 29.9 in adult 09/02/2017   Migraine 05/29/2017   Mild recurrent major depression (HCC) 04/24/2017   Anxiety 12/23/2014   Primary hypertension 03/26/2013    ONSET DATE: 6 month ago  REFERRING DIAG: R medial epicondylitis   THERAPY DIAG:  Medial epicondylitis of right elbow  Pain in right elbow  Muscle weakness (generalized)  Rationale for Evaluation and Treatment: Rehabilitation  SUBJECTIVE:  I can tell pain is so much better on my off days as well as  sleeping so much better.  If I get medication the day and the next day pain is better but then when  I go back to work and work for  about 5 hours then it starts hurting again -my supervisor said he 5 for doctor's note that they and he can work with me to do lighter duty  SUBJECTIVE STATEMENT:  Pt accompanied by: self  PERTINENT HISTORY:DR Corey's note 08/15/22: 52  y.o. female with right medial elbow pain thought to be due to medial epicondylitis. Plan to refer to occupational therapy.  She lives in [place] so we will use the [facility]. Recommend Voltaren gel and wrist strap or thumb lift with work. We talked about injection versus prednisone today.  Will try course of oral prednisone.  PRECAUTIONS: None  WEIGHT BEARING RESTRICTIONS: No  PAIN:  Are you having pain? 7/10 pain coming in medial epicondyle FALLS: Has patient fallen in last 6 months? No  LIVING ENVIRONMENT: Lives with: lives with their family   PLOF: never had injury or surgery to R UE- had some pain after using crutches in the past  PATIENT GOALS: I want my pain better my right arm so that I can do my job, holding a grandbaby do things around the house without pain  OBJECTIVE:   HAND DOMINANCE: Right  UPPER EXTREMITY ROM:    Active range of motion in right digits as well as elbow and wrist within normal limits.  Patient with increased pain with wrist flexion, radial and ulnar deviation as well as supination pronation.  Increased pain with gripping and twisting.  As well as increased pain with 3-point pinch. Slight pull with pain over medial epicondyle and volar forearm with wrist extension stretch.  SENSATION: At eval -  Patient report numbness and pins-and-needles at times in the fifth finger.  Patient has a positive Tinel over the cubital tunnel-but tenderness mostly at the medial epicondyle more than the cubital tunnel.  EDEMA: Patient with increased edema and inflammation on the medial elbow  COGNITION: Overall cognitive status: Within functional limits for tasks assessed  OBSERVATIONS: Patient with tenderness over medial epicondyle.  As well as positive Tinel cubital tunnel but not tender.   TODAY'S TREATMENT:                                                                                                                               DATE:10/07/2022:  I felt so better after I had the medication that the day.  I had no pain for about 2 days and then when I went back to work today by about 5 hours it started hurting  contrast: Contrast for 8 min - alternate hot and cold to decrease edema and pain at R elbow Recommend patient to perform at home 2-3 times a day if possible.   Soft tissue mobs to volar forearm - using graston tool nr 2 sweeping and MC spreads   Following contrast:  gentle stretches for forearm flexors, 5 reps 5 seconds hold elbow to side -and then extended arm with forearm in pronation- 5 reps 5 sec    Discussed work modifications and alternating activities when she can, she feels it is difficult to modify some of the tasks but has been trying to find positions and ways to hold, pick up and manage items.   Mrs. Dr. Denyse Amass the patient to send a message for him to send a note for light duty after 5 hours of regular duty-no pulling and pulling or lifting and carrying more than 5 pounds as well as not repetitive squeezing and gripping.-Her supervisor is willing to work with her  Iontophoresis: Iontophoresis with dexamethasone with a medium patch at 2.0 current for 19 minutes performed on right medial epicondyle.  Patient tolerated well skin check done prior and pt to keep patch on for hour after treatment.   Home program:  Cont to wear elbow pad to use during the day for cushioning over medial epicondyle,  out side of work and nighttime in the volar elbow or over the medial epicondyle depending on what decreases her pain. And then counter force strap at work  Patient to cont with wrist support to immobilize wrist flexors and forearm flexors during day if not at work  Cont  wearing  counterforce strap  to decrease pain.   PATIENT EDUCATION: Education details: Findings of evaluation and home program Person educated: Patient Education method: Explanation, Demonstration, Tactile cues, Verbal cues, and  Handouts Education comprehension: verbalized understanding, returned demonstration, verbal cues required, tactile cues required, and needs further education   GOALS: Goals reviewed with patient? Yes  LONG TERM GOALS: 8 wks   Resting pain at right elbow decreased to less than 2/10 for  patient to discharge  wrist brace as well as initiated extended elbow stretches Baseline: 8/10 pain that can increase with use to more than 10/10 pain.  Tenderness.  Patient fitted with a wrist support brace as well as a counterforce strap to use most of the time.  Elbow pad fabricated for cushioning over medial epicondyle and cubital tunnel no stretches initiate because of increased pain Goal status: INITIAL  2.  Pain right elbow decrease for patient to tolerate initiating strengthening with pain less than 2/10 Baseline: Resting pain 8/10 can increase to 10/10 with use.  Patient fitted with a wrist splint as well as a counterforce strap.  As well as elbow pad to wear at nighttime as well as when sitting for cushioning.  No strengthening or stretches Goal status: INITIAL  3.  Patient is right grip and prehension strength within normal limits for her age with pain less than a 2/10. Baseline: Not tested tender 8/10 pain over medial epicondyle with increased pain with grip increasing 10/10.  No stretches or stretch exercises initiated Goal status: INITIAL  4.  Patient able to hold grandbaby as well as wipe table ,bathing ,dress and cook with pain less than 2/10 Baseline: Pain at rest 8/10 that increased to 10/10 with any activities at work as well as picking up grandbaby-cannot do that anymore as well as bathing and dressing and wiping the table.  Increased pain with sweeping and cooking Goal status: INITIAL ASSESSMENT:  CLINICAL IMPRESSION: Patient present at occupational therapy evaluation with a diagnosis of right dominant hand medial epicondylitis.  Patient  at eval with resting pain 8/10 with increased  tenderness and pain with wrist flexors as well as supination /pronation.  Increased pain with gripping and 3-point pinch.  Pain can increase to 10/10 with use.  Patient with increased pain limiting her functional use in ADLs, IADLs as well as work.  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 Patient to continue wearing her counterforce strap as well as wrist brace at work and home.  Elbow pad to use at nighttime for cushioning over the medial epicondyle as well as cubital tunnel.   Patient tenderness mostly at the medial epicondyle.  Pain decrease to 0-1/10 at rest  and even doing things around the house after Ionto but then increase at any work 7/10 - numbness better and night time pain better. 6th application of Iontophoresis with dexamethasone - pt was not seen for about 2-3 wks.  Patient tolerated well.  Pt to continue to look at options to modify work tasks to decrease pain and symptoms in elbow.  Patient can benefit from skilled OT services to decrease pain and increase motion and strength to be able to use right dominant hand in ADLs and IADLs as well as work without increase symptoms.  PERFORMANCE DEFICITS: in functional skills including ADLs, IADLs, sensation, edema, ROM, strength, pain, flexibility, and UE functional use,  , and psychosocial skills including habits and routines and behaviors.   IMPAIRMENTS: are limiting patient from ADLs, IADLs, rest and sleep, work, play, leisure, and social participation.   COMORBIDITIES: has no other co-morbidities that affects occupational performance. Patient will benefit from skilled OT to address above impairments and improve overall function.  MODIFICATION OR ASSISTANCE TO COMPLETE EVALUATION: No modification of tasks or assist necessary to complete an evaluation.  OT OCCUPATIONAL PROFILE AND HISTORY: Problem focused assessment: Including review of records relating to presenting problem.  CLINICAL DECISION MAKING: LOW - limited treatment  options,  no task modification necessary  REHAB POTENTIAL: Good  EVALUATION COMPLEXITY: Low  PLAN:  OT FREQUENCY: 2x/week  OT DURATION: 8 weeks  PLANNED INTERVENTIONS: therapeutic exercise, neuromuscular re-education, manual therapy, passive range of motion, splinting, ultrasound, iontophoresis, cryotherapy, contrast bath, patient/family education, and DME and/or AE instructions   CONSULTED AND AGREED WITH PLAN OF CARE: Patient  Oletta Cohn, OTR/L,CLT  10/10/2022, 5:13 PM

## 2022-10-11 ENCOUNTER — Encounter: Payer: Self-pay | Admitting: Family Medicine

## 2022-10-13 ENCOUNTER — Other Ambulatory Visit: Payer: Self-pay | Admitting: Nurse Practitioner

## 2022-10-13 DIAGNOSIS — E7800 Pure hypercholesterolemia, unspecified: Secondary | ICD-10-CM

## 2022-10-14 ENCOUNTER — Ambulatory Visit: Payer: BC Managed Care – PPO | Admitting: Occupational Therapy

## 2022-10-14 DIAGNOSIS — M7701 Medial epicondylitis, right elbow: Secondary | ICD-10-CM

## 2022-10-14 DIAGNOSIS — M25521 Pain in right elbow: Secondary | ICD-10-CM | POA: Diagnosis not present

## 2022-10-14 DIAGNOSIS — M6281 Muscle weakness (generalized): Secondary | ICD-10-CM | POA: Diagnosis not present

## 2022-10-14 MED ORDER — ROSUVASTATIN CALCIUM 5 MG PO TABS: 5.0000 mg | ORAL_TABLET | Freq: Every day | ORAL | 3 refills | Status: DC

## 2022-10-14 NOTE — Therapy (Signed)
OUTPATIENT OCCUPATIONAL THERAPY ORTHO TREATMENT  Patient Name: Monica Reed MRN: [id number] DOB:[birth year], 52 y.o., female   PCP: Dr Laury Deep PROVIDER: Dr Denyse Amass  END OF SESSION:  OT End of Session - 10/14/22 1052     Visit Number 7    Number of Visits 12    Date for OT Re-Evaluation 10/31/22    OT Start Time 1037    OT Stop Time 1130    OT Time Calculation (min) 53 min    Activity Tolerance Patient tolerated treatment well    Behavior During Therapy WFL for tasks assessed/performed             Past Medical History:  Diagnosis Date   Frequent UTI    Hypertension    No past surgical history on file. Patient Active Problem List   Diagnosis Date Noted   Fear of flying 05/05/2022   Cervical disc disease 07/15/2021   Fredrickson type IIa hyperlipoproteinemia 05/10/2021   Degenerative tear of acetabular labrum of right hip 12/21/2020   DDD (degenerative disc disease), lumbar 10/10/2020   Vitamin D deficiency 04/30/2020   Hyperlipidemia 09/29/2018   Mass of arm, right 06/23/2018   Overweight with body mass index (BMI) of 29 to 29.9 in adult 09/02/2017   Migraine 05/29/2017   Mild recurrent major depression (HCC) 04/24/2017   Anxiety 12/23/2014   Primary hypertension 03/26/2013    ONSET DATE: 6 month ago  REFERRING DIAG: R medial epicondylitis   THERAPY DIAG:  Medial epicondylitis of right elbow  Pain in right elbow  Muscle weakness (generalized)  Rationale for Evaluation and Treatment: Rehabilitation  SUBJECTIVE:  Did not feel to bed after 5 days working.  But for some reason could not sleep Saturday-now that I am thinking had my grandbaby-she is 30 months old and was jumping on my lap-did get the letter from her doctor to take to my work  SUBJECTIVE STATEMENT:  Pt accompanied by: self  PERTINENT HISTORY:DR Corey's note 08/15/22: 52 y.o. female with right medial elbow pain thought to be due to medial epicondylitis. Plan to refer to  occupational therapy.  She lives in [place] so we will use the [facility]. Recommend Voltaren gel and wrist strap or thumb lift with work. We talked about injection versus prednisone today.  Will try course of oral prednisone.  PRECAUTIONS: None  WEIGHT BEARING RESTRICTIONS: No  PAIN:  Coming in sitting no pain in with using driving 4/09  FALLS: Has patient fallen in last 6 months? No  LIVING ENVIRONMENT: Lives with: lives with their family   PLOF: never had injury or surgery to R UE- had some pain after using crutches in the past  PATIENT GOALS: I want my pain better my right arm so that I can do my job, holding a grandbaby do things around the house without pain  OBJECTIVE:   HAND DOMINANCE: Right  UPPER EXTREMITY ROM:     AT EVAL : Active range of motion in right digits as well as elbow and wrist within normal limits.  Patient with increased pain with wrist flexion, radial and ulnar deviation as well as supination pronation.  Increased pain with gripping and twisting.  As well as increased pain with 3-point pinch. Slight pull with pain over medial epicondyle and volar forearm with wrist extension stretch.   10/14/22:  GRIP R 25, L 50 lbs. Lat grip 9 and L 12 lbs and 3 point R 6 and L 12 lbs - pain with  3 point pinch and grip on R   SENSATION: At eval - Patient report numbness and pins-and-needles at times in the fifth finger.  Patient has a positive Tinel over the cubital tunnel-but tenderness mostly at the medial epicondyle more than the cubital tunnel.  10/14/22: Negative Tinel and tenderness over the cubital tunnel.  2/10 tenderness over medial epicondyle  EDEMA: Patient with increased edema and inflammation on the medial elbow  COGNITION: Overall cognitive status: Within functional limits for tasks assessed  OBSERVATIONS: Patient with tenderness over medial epicondyle.  As well as positive Tinel cubital tunnel but not tender.   TODAY'S TREATMENT:                                                                                                                               DATE:10/14/2022:  I felt so better after I had the medication  the day.  It was not bad this past 5 days maybe 6-7/10 at the end of the day working.  Sleeping okay except Saturday when I had my grandbaby. contrast: Contrast for 8 min - alternate hot and cold to decrease edema and pain at R elbow Recommend patient to perform at home 2-3 times a day if possible.   Soft tissue mobs to volar forearm - using graston tool nr 2 sweeping and MC spreads   Following contrast:  gentle stretches for forearm flexors, 5 reps 5 seconds hold elbow to side -and then extended arm with forearm in pronation- 5 reps 5 sec    Discussed work modifications and alternating activities when she can, she feels it is difficult to modify some of the tasks but has been trying to find positions and ways to hold, pick up and manage items.   Dr. Denyse Amass -did send her a MyChart and note  for light duty after 5 hours of regular duty-no pulling and pulling or lifting and carrying more than 5 pounds as well as not repetitive squeezing and gripping.-Her supervisor is willing to work with her  Iontophoresis: Iontophoresis with dexamethasone with a medium patch at 2.0 current for 19 minutes performed on right medial epicondyle.  Patient tolerated well skin check done prior and pt to keep patch on for hour after treatment.   Home program:  Cont to wear elbow pad to use during the day for cushioning over medial epicondyle,  out side of work and nighttime in the volar elbow or over the medial epicondyle depending on what decreases her pain. And then counter force strap at work  Patient to cont with wrist support to immobilize wrist flexors and forearm flexors during day if not at work  Cont  wearing  counterforce strap  to decrease pain.   PATIENT EDUCATION: Education details: Findings of evaluation and home  program Person educated: Patient Education method: Explanation, Demonstration, Tactile cues, Verbal cues, and Handouts Education comprehension: verbalized understanding, returned demonstration, verbal cues required, tactile cues required, and needs further education  GOALS: Goals reviewed with patient? Yes  LONG TERM GOALS: 8 wks   Resting pain at right elbow decreased to less than 2/10 for patient to discharge  wrist brace as well as initiated extended elbow stretches Baseline: 8/10 pain that can increase with use to more than 10/10 pain.  Tenderness.  Patient fitted with a wrist support brace as well as a counterforce strap to use most of the time.  Elbow pad fabricated for cushioning over medial epicondyle and cubital tunnel no stretches initiate because of increased pain Goal status: INITIAL  2.  Pain right elbow decrease for patient to tolerate initiating strengthening with pain less than 2/10 Baseline: Resting pain 8/10 can increase to 10/10 with use.  Patient fitted with a wrist splint as well as a counterforce strap.  As well as elbow pad to wear at nighttime as well as when sitting for cushioning.  No strengthening or stretches Goal status: INITIAL  3.  Patient is right grip and prehension strength within normal limits for her age with pain less than a 2/10. Baseline: Not tested tender 8/10 pain over medial epicondyle with increased pain with grip increasing 10/10.  No stretches or stretch exercises initiated Goal status: INITIAL  4.  Patient able to hold grandbaby as well as wipe table ,bathing ,dress and cook with pain less than 2/10 Baseline: Pain at rest 8/10 that increased to 10/10 with any activities at work as well as picking up grandbaby-cannot do that anymore as well as bathing and dressing and wiping the table.  Increased pain with sweeping and cooking Goal status: INITIAL ASSESSMENT:  CLINICAL IMPRESSION: Patient present at occupational therapy evaluation with a  diagnosis of right dominant hand medial epicondylitis.  Patient  at eval with resting pain 8/10 with increased tenderness and pain with wrist flexors as well as supination /pronation.  Increased pain with gripping and 3-point pinch.  Pain can increase to 10/10 with use.  Patient with increased pain limiting her functional use in ADLs, IADLs as well as work.  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 Patient to continue wearing her counterforce strap as well as wrist brace at work and home.  Elbow pad to use at nighttime for cushioning over the medial epicondyle as well as cubital tunnel.   Patient tenderness mostly at the medial epicondyle.  Pain decrease to 0-1/10 at rest  and even doing things around the house after Ionto but then increase at any work 6-7/10 - numbness better and night time pain better. 67h application of Iontophoresis with dexamethasone - pt was not seen for about 2-3 wks.  Patient tolerated well.  Pt to continue to look at options to modify work tasks to decrease pain and symptoms in elbow.  She did get a letter from her doctor in MyChart to provide for her job to do a lighter duty to assist this with decreasing pain.  Pt can benefit from skilled OT services to decrease pain and increase motion and strength to be able to use right dominant hand in ADLs and IADLs as well as work without increase symptoms.  PERFORMANCE DEFICITS: in functional skills including ADLs, IADLs, sensation, edema, ROM, strength, pain, flexibility, and UE functional use,  , and psychosocial skills including habits and routines and behaviors.   IMPAIRMENTS: are limiting patient from ADLs, IADLs, rest and sleep, work, play, leisure, and social participation.   COMORBIDITIES: has no other co-morbidities that affects occupational performance. Patient will benefit from skilled OT to  address above impairments and improve overall function.  MODIFICATION OR ASSISTANCE TO COMPLETE EVALUATION: No modification of  tasks or assist necessary to complete an evaluation.  OT OCCUPATIONAL PROFILE AND HISTORY: Problem focused assessment: Including review of records relating to presenting problem.  CLINICAL DECISION MAKING: LOW - limited treatment options, no task modification necessary  REHAB POTENTIAL: Good  EVALUATION COMPLEXITY: Low  PLAN:  OT FREQUENCY: 2x/week  OT DURATION: 8 weeks  PLANNED INTERVENTIONS: therapeutic exercise, neuromuscular re-education, manual therapy, passive range of motion, splinting, ultrasound, iontophoresis, cryotherapy, contrast bath, patient/family education, and DME and/or AE instructions   CONSULTED AND AGREED WITH PLAN OF CARE: Patient  Oletta Cohn, OTR/L,CLT  10/14/2022, 11:19 AM

## 2022-10-16 ENCOUNTER — Ambulatory Visit: Payer: BC Managed Care – PPO | Admitting: Occupational Therapy

## 2022-10-16 DIAGNOSIS — M25521 Pain in right elbow: Secondary | ICD-10-CM

## 2022-10-16 DIAGNOSIS — M7701 Medial epicondylitis, right elbow: Secondary | ICD-10-CM

## 2022-10-16 DIAGNOSIS — M6281 Muscle weakness (generalized): Secondary | ICD-10-CM | POA: Diagnosis not present

## 2022-10-16 NOTE — Therapy (Signed)
OUTPATIENT OCCUPATIONAL THERAPY ORTHO TREATMENT  Patient Name: Monica Reed MRN: [id number] DOB:10-Sep-1970, 52 y.o., female   PCP: Dr Laury Deep PROVIDER: Dr Denyse Amass  END OF SESSION:  OT End of Session - 10/16/22 1659     Visit Number 8    Date for OT Re-Evaluation 10/31/22    OT Start Time 1623    OT Stop Time 1707    OT Time Calculation (min) 44 min    Activity Tolerance Patient tolerated treatment well    Behavior During Therapy WFL for tasks assessed/performed             Past Medical History:  Diagnosis Date   Frequent UTI    Hypertension    No past surgical history on file. Patient Active Problem List   Diagnosis Date Noted   Fear of flying 05/05/2022   Cervical disc disease 07/15/2021   Fredrickson type IIa hyperlipoproteinemia 05/10/2021   Degenerative tear of acetabular labrum of right hip 12/21/2020   DDD (degenerative disc disease), lumbar 10/10/2020   Vitamin D deficiency 04/30/2020   Hyperlipidemia 09/29/2018   Mass of arm, right 06/23/2018   Overweight with body mass index (BMI) of 29 to 29.9 in adult 09/02/2017   Migraine 05/29/2017   Mild recurrent major depression (HCC) 04/24/2017   Anxiety 12/23/2014   Primary hypertension 03/26/2013    ONSET DATE: 6 month ago  REFERRING DIAG: R medial epicondylitis   THERAPY DIAG:  Medial epicondylitis of right elbow  Pain in right elbow  Rationale for Evaluation and Treatment: Rehabilitation  SUBJECTIVE:  SUBJECTIVE STATEMENT: Better did not work for 3 days- bathing and dressing not bad - except washing my hair- but tomorrow back to work - will see if HR and supervisor to put in on light job - they said they can do it      Pt accompanied by: self  PERTINENT HISTORY:DR Corey's note 08/15/22: 52 y.o. female with right medial elbow pain thought to be due to medial epicondylitis. Plan to refer to occupational therapy.  She lives in [place] so we will use the [facility]. Recommend Voltaren gel and wrist strap or thumb lift with work. We talked about injection versus prednisone today.  Will try course of oral prednisone.  PRECAUTIONS: None  WEIGHT BEARING RESTRICTIONS: No  PAIN:  Coming in sitting no pain in with using driving 4/01  FALLS: Has patient fallen in last 6 months? No  LIVING ENVIRONMENT: Lives with: lives with their family   PLOF: never had injury or surgery to R UE- had some pain after using crutches in the past  PATIENT GOALS: I want my pain better my right arm so that I can do my job, holding a grandbaby do things around the house without pain  OBJECTIVE:   HAND DOMINANCE: Right  UPPER EXTREMITY ROM:     AT EVAL : Active range of motion in right digits as well as elbow and wrist within normal limits.  Patient with increased pain with wrist flexion, radial and ulnar deviation as well as supination pronation.  Increased pain with gripping and twisting.  As well as increased pain with 3-point pinch. Slight pull with pain over medial epicondyle and volar forearm with wrist extension stretch.   10/14/22:  GRIP R 25, L 50 lbs. Lat grip 9 and L 12 lbs and 3 point R 6 and L 12 lbs - pain with 3 point pinch and grip on R   SENSATION: At eval -  Patient report numbness and pins-and-needles at times in the fifth finger.  Patient has a positive Tinel over the cubital tunnel-but tenderness mostly at the medial epicondyle more than the cubital tunnel.  10/14/22: Negative Tinel and tenderness over the cubital tunnel.  2/10 tenderness over medial epicondyle  EDEMA: Patient with increased edema and inflammation on the medial elbow  COGNITION: Overall cognitive status: Within functional limits for tasks assessed  OBSERVATIONS: Patient with tenderness over medial epicondyle.  As well as positive Tinel cubital tunnel but not tender.   TODAY'S TREATMENT:                                                                                                                               DATE:10/16/2022: Tenderness decrease to 2/10 and stretches 1-2/10  Grip 2-3/10 elbow to side and extended arm  Resistance to wrist flexion - pain 7/10   Feels good -was off for 3 days    contrast: Contrast for 8 min - alternate hot and cold to decrease edema and pain at R elbow Recommend patient to perform at home 2-3 times a day if possible.   Soft tissue mobs to volar forearm - using graston tool nr 2 sweeping and MC spreads   Following contrast:  gentle stretches for forearm flexors, 5 reps 5 seconds hold elbow to side -and then extended arm with forearm in pronation- 5 reps 5 sec    Discussed work modifications and alternating activities when she can, she feels it is difficult to modify some of the tasks but has been trying to find positions and ways to hold, pick up and manage items.   Dr. Denyse Amass -did send her a MyChart and note  for light duty after 5 hours of regular duty-no pulling and pulling or lifting and carrying more than 5 pounds as well as not repetitive squeezing and gripping.-Her supervisor is willing to work with her  Iontophoresis: Iontophoresis with dexamethasone with a medium patch at 2.0 current for 19 minutes performed on right medial epicondyle.  Patient tolerated well skin check done prior and pt to keep patch on for hour after treatment.   Home program:  Cont to wear elbow pad to use during the day for cushioning over medial epicondyle,  out side of work and nighttime in the volar elbow or over the medial epicondyle depending on what decreases her pain. And then counter force strap at work  Patient to cont with wrist support to immobilize wrist flexors and forearm flexors during day if not at work  Cont  wearing  counterforce strap  to decrease pain.   PATIENT EDUCATION: Education details: Findings of evaluation and home program Person educated: Patient Education method: Explanation, Demonstration, Tactile cues, Verbal cues, and  Handouts Education comprehension: verbalized understanding, returned demonstration, verbal cues required, tactile cues required, and needs further education   GOALS: Goals reviewed with patient? Yes  LONG TERM GOALS: 8 wks   Resting pain at  right elbow decreased to less than 2/10 for patient to discharge  wrist brace as well as initiated extended elbow stretches Baseline: 8/10 pain that can increase with use to more than 10/10 pain.  Tenderness.  Patient fitted with a wrist support brace as well as a counterforce strap to use most of the time.  Elbow pad fabricated for cushioning over medial epicondyle and cubital tunnel no stretches initiate because of increased pain Goal status: INITIAL  2.  Pain right elbow decrease for patient to tolerate initiating strengthening with pain less than 2/10 Baseline: Resting pain 8/10 can increase to 10/10 with use.  Patient fitted with a wrist splint as well as a counterforce strap.  As well as elbow pad to wear at nighttime as well as when sitting for cushioning.  No strengthening or stretches Goal status: INITIAL  3.  Patient is right grip and prehension strength within normal limits for her age with pain less than a 2/10. Baseline: Not tested tender 8/10 pain over medial epicondyle with increased pain with grip increasing 10/10.  No stretches or stretch exercises initiated Goal status: INITIAL  4.  Patient able to hold grandbaby as well as wipe table ,bathing ,dress and cook with pain less than 2/10 Baseline: Pain at rest 8/10 that increased to 10/10 with any activities at work as well as picking up grandbaby-cannot do that anymore as well as bathing and dressing and wiping the table.  Increased pain with sweeping and cooking Goal status: INITIAL ASSESSMENT:  CLINICAL IMPRESSION: Patient present at occupational therapy evaluation with a diagnosis of right dominant hand medial epicondylitis.  Patient  at eval with resting pain 8/10 with increased  tenderness and pain with wrist flexors as well as supination /pronation.  Increased pain with gripping and 3-point pinch.  Pain can increase to 10/10 with use.  Patient with increased pain limiting her functional use in ADLs, IADLs as well as work.  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 Patient to continue wearing her counterforce strap as well as wrist brace at work and home.  Elbow pad to use at nighttime for cushioning over the medial epicondyle as well as cubital tunnel.   Patient tenderness mostly at the medial epicondyle.  Pain decrease to 0-2/10 at rest  and even doing things around the house on her off days. Pain increase after 5 hrs at work to  6-7/10 - numbness better and night time pain better. Iontophoresis with dexamethasone - pt was not seen for about 2-3 wks.  Patient tolerated well.  Pt to continue to look at options to modify work tasks to decrease pain and symptoms in elbow.  She did get a letter from her doctor in MyChart to provide for her job to do a lighter duty to assist this with decreasing pain.  Pt can benefit from skilled OT services to decrease pain and increase motion and strength to be able to use right dominant hand in ADLs and IADLs as well as work without increase symptoms.  PERFORMANCE DEFICITS: in functional skills including ADLs, IADLs, sensation, edema, ROM, strength, pain, flexibility, and UE functional use,  , and psychosocial skills including habits and routines and behaviors.   IMPAIRMENTS: are limiting patient from ADLs, IADLs, rest and sleep, work, play, leisure, and social participation.   COMORBIDITIES: has no other co-morbidities that affects occupational performance. Patient will benefit from skilled OT to address above impairments and improve overall function.  MODIFICATION OR ASSISTANCE TO COMPLETE EVALUATION: No  modification of tasks or assist necessary to complete an evaluation.  OT OCCUPATIONAL PROFILE AND HISTORY: Problem focused  assessment: Including review of records relating to presenting problem.  CLINICAL DECISION MAKING: LOW - limited treatment options, no task modification necessary  REHAB POTENTIAL: Good  EVALUATION COMPLEXITY: Low  PLAN:  OT FREQUENCY: 2x/week  OT DURATION: 8 weeks  PLANNED INTERVENTIONS: therapeutic exercise, neuromuscular re-education, manual therapy, passive range of motion, splinting, ultrasound, iontophoresis, cryotherapy, contrast bath, patient/family education, and DME and/or AE instructions   CONSULTED AND AGREED WITH PLAN OF CARE: Patient  Oletta Cohn, OTR/L,CLT  10/16/2022, 5:01 PM

## 2022-10-22 ENCOUNTER — Ambulatory Visit (INDEPENDENT_AMBULATORY_CARE_PROVIDER_SITE_OTHER): Payer: BC Managed Care – PPO | Admitting: Family Medicine

## 2022-10-22 ENCOUNTER — Ambulatory Visit: Payer: BC Managed Care – PPO | Attending: Family Medicine | Admitting: Occupational Therapy

## 2022-10-22 VITALS — BP 131/77 | HR 86 | Resp 20 | Ht 60.0 in | Wt 151.0 lb

## 2022-10-22 DIAGNOSIS — M7701 Medial epicondylitis, right elbow: Secondary | ICD-10-CM | POA: Insufficient documentation

## 2022-10-22 DIAGNOSIS — M25521 Pain in right elbow: Secondary | ICD-10-CM | POA: Diagnosis not present

## 2022-10-22 DIAGNOSIS — M6281 Muscle weakness (generalized): Secondary | ICD-10-CM | POA: Diagnosis not present

## 2022-10-22 DIAGNOSIS — I10 Essential (primary) hypertension: Secondary | ICD-10-CM

## 2022-10-22 NOTE — Progress Notes (Signed)
Pt denies CP, SOB, dizziness, or heart palpitations. taking meds as directed without problems. Denies med side effects. 5 min spent with pt. 

## 2022-10-22 NOTE — Therapy (Signed)
OUTPATIENT OCCUPATIONAL THERAPY ORTHO TREATMENT  Patient Name: Monica Reed MRN: [id number] DOB:12/28/1970, 53 y.o., female   PCP: Dr Laury Deep PROVIDER: Dr Denyse Amass  END OF SESSION:  OT End of Session - 10/22/22 1133     Visit Number 9    Number of Visits 12    Date for OT Re-Evaluation 10/31/22    OT Start Time 1127    OT Stop Time 1222    OT Time Calculation (min) 55 min    Activity Tolerance Patient tolerated treatment well    Behavior During Therapy WFL for tasks assessed/performed             Past Medical History:  Diagnosis Date   Frequent UTI    Hypertension    No past surgical history on file. Patient Active Problem List   Diagnosis Date Noted   Fear of flying 05/05/2022   Cervical disc disease 07/15/2021   Fredrickson type IIa hyperlipoproteinemia 05/10/2021   Degenerative tear of acetabular labrum of right hip 12/21/2020   DDD (degenerative disc disease), lumbar 10/10/2020   Vitamin D deficiency 04/30/2020   Hyperlipidemia 09/29/2018   Mass of arm, right 06/23/2018   Overweight with body mass index (BMI) of 29 to 29.9 in adult 09/02/2017   Migraine 05/29/2017   Mild recurrent major depression (HCC) 04/24/2017   Anxiety 12/23/2014   Primary hypertension 03/26/2013    ONSET DATE: 6 month ago  REFERRING DIAG: R medial epicondylitis   THERAPY DIAG:  Medial epicondylitis of right elbow  Pain in right elbow  Muscle weakness (generalized)  Rationale for Evaluation and Treatment: Rehabilitation  SUBJECTIVE:  SUBJECTIVE STATEMENT: I was doing great for Thurs until Sat - doing 5 hrs light - but then Sunday I did something and got this shooting pain up from my wrist to elbow- could not work anymore -cancel Sunday - pain still about 9/10      Pt accompanied by: self  PERTINENT HISTORY:DR Corey's note 08/15/22: 52 y.o. female with right medial elbow pain thought to be due to medial epicondylitis. Plan to refer to occupational  therapy.  She lives in [place] so we will use the [facility]. Recommend Voltaren gel and wrist strap or thumb lift with work. We talked about injection versus prednisone today.  Will try course of oral prednisone.  PRECAUTIONS: None  WEIGHT BEARING RESTRICTIONS: No  PAIN:  Coming in sitting no pain in with using driving 4/09  FALLS: Has patient fallen in last 6 months? No  LIVING ENVIRONMENT: Lives with: lives with their family   PLOF: never had injury or surgery to R UE- had some pain after using crutches in the past  PATIENT GOALS: I want my pain better my right arm so that I can do my job, holding a grandbaby do things around the house without pain  OBJECTIVE:   HAND DOMINANCE: Right  UPPER EXTREMITY ROM:     AT EVAL : Active range of motion in right digits as well as elbow and wrist within normal limits.  Patient with increased pain with wrist flexion, radial and ulnar deviation as well as supination pronation.  Increased pain with gripping and twisting.  As well as increased pain with 3-point pinch. Slight pull with pain over medial epicondyle and volar forearm with wrist extension stretch.   10/14/22:  GRIP R 25, L 50 lbs. Lat grip 9 and L 12 lbs and 3 point R 6 and L 12 lbs - pain with 3  point pinch and grip on R   SENSATION: At eval - Patient report numbness and pins-and-needles at times in the fifth finger.  Patient has a positive Tinel over the cubital tunnel-but tenderness mostly at the medial epicondyle more than the cubital tunnel.  10/14/22: Negative Tinel and tenderness over the cubital tunnel.  2/10 tenderness over medial epicondyle  EDEMA: Patient with increased edema and inflammation on the medial elbow  COGNITION: Overall cognitive status: Within functional limits for tasks assessed  OBSERVATIONS: Patient with tenderness over medial epicondyle.  As well as positive Tinel cubital tunnel but not tender.   TODAY'S TREATMENT:                                                                                                                               DATE:10/22/2022: Tenderness at medial epicondyle 9/10  Tenderness over volar forearm  9/10 Review with pt activity she was doing - appear for 3 days 5 hrs done cupcake - pick up and release- but when place - flex wrist repetitively - Pt to keep wrist neutral with activities - and stretch out inbetween Pt going to be off for 3 days   Contrast for 8 min - alternate hot and cold to decrease edema and pain at R elbow Recommend patient to perform at home 2-3 times a day if possible.   Soft tissue mobs to volar forearm - using graston tool nr 2 sweeping and MC spreads   Following contrast: Pain increase with attempts  gentle stretches for forearm flexors, 5 reps 5 seconds hold elbow to side -and then extended arm with forearm in pronation- 5 reps 5 sec    HOLD OFF   Discussed work modifications and alternating activities when she can, she feels it is difficult to modify some of the tasks but has been trying to find positions and ways to hold, pick up and manage items.     Iontophoresis: Iontophoresis with dexamethasone with a medium patch at 1.8 current for 21 minutes performed on right medial epicondyle.  Patient tolerated well skin check done prior and pt to keep patch on for hour after treatment.   Home program:  Cont to wear elbow pad to use during the day for cushioning over medial epicondyle,  out side of work and nighttime in the volar elbow or over the medial epicondyle depending on what decreases her pain. And then counter force strap at work  Patient to cont with wrist support to immobilize wrist flexors and forearm flexors during day if not at work  Cont  wearing  counterforce strap  to decrease pain.   PATIENT EDUCATION: Education details: Findings of evaluation and home program Person educated: Patient Education method: Explanation, Demonstration, Tactile cues, Verbal cues,  and Handouts Education comprehension: verbalized understanding, returned demonstration, verbal cues required, tactile cues required, and needs further education   GOALS: Goals reviewed with patient? Yes  LONG TERM GOALS: 8 wks   Resting  pain at right elbow decreased to less than 2/10 for patient to discharge  wrist brace as well as initiated extended elbow stretches Baseline: 8/10 pain that can increase with use to more than 10/10 pain.  Tenderness.  Patient fitted with a wrist support brace as well as a counterforce strap to use most of the time.  Elbow pad fabricated for cushioning over medial epicondyle and cubital tunnel no stretches initiate because of increased pain Goal status: INITIAL  2.  Pain right elbow decrease for patient to tolerate initiating strengthening with pain less than 2/10 Baseline: Resting pain 8/10 can increase to 10/10 with use.  Patient fitted with a wrist splint as well as a counterforce strap.  As well as elbow pad to wear at nighttime as well as when sitting for cushioning.  No strengthening or stretches Goal status: INITIAL  3.  Patient is right grip and prehension strength within normal limits for her age with pain less than a 2/10. Baseline: Not tested tender 8/10 pain over medial epicondyle with increased pain with grip increasing 10/10.  No stretches or stretch exercises initiated Goal status: INITIAL  4.  Patient able to hold grandbaby as well as wipe table ,bathing ,dress and cook with pain less than 2/10 Baseline: Pain at rest 8/10 that increased to 10/10 with any activities at work as well as picking up grandbaby-cannot do that anymore as well as bathing and dressing and wiping the table.  Increased pain with sweeping and cooking Goal status: INITIAL ASSESSMENT:  CLINICAL IMPRESSION: Patient present at occupational therapy evaluation with a diagnosis of right dominant hand medial epicondylitis.  Patient  at eval with resting pain 8/10 with increased  tenderness and pain with wrist flexors as well as supination /pronation.  Increased pain with gripping and 3-point pinch.  Pain can increase to 10/10 with use.  Patient with increased pain limiting her functional use in ADLs, IADLs as well as work.  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 Patient to continue wearing her counterforce strap as well as wrist brace at work and home.  Elbow pad to use at nighttime for cushioning over the medial epicondyle as well as cubital tunnel.   Patient tenderness mostly at the medial epicondyle.  Pain was decrease to 0-2/10 at rest  and even doing things around the house on her off days. Pain increase after 5 hrs at work to  6-7/10 - numbness better and night time pain better. Iontophoresis with dexamethasone - This date pt return after doing last week 3 days of 5 hrs regular duty and light duty - appear pt flex her wrist repetitively releasing cupcake- and flexor of forearm was strain- pain coming in 9/10 - decrease to 6/10 - pt to cont to do at home contrast and hold off on stretches -   Pt can benefit from skilled OT services to decrease pain and increase motion and strength to be able to use right dominant hand in ADLs and IADLs as well as work without increase symptoms.  PERFORMANCE DEFICITS: in functional skills including ADLs, IADLs, sensation, edema, ROM, strength, pain, flexibility, and UE functional use,  , and psychosocial skills including habits and routines and behaviors.   IMPAIRMENTS: are limiting patient from ADLs, IADLs, rest and sleep, work, play, leisure, and social participation.   COMORBIDITIES: has no other co-morbidities that affects occupational performance. Patient will benefit from skilled OT to address above impairments and improve overall function.  MODIFICATION OR ASSISTANCE TO COMPLETE  EVALUATION: No modification of tasks or assist necessary to complete an evaluation.  OT OCCUPATIONAL PROFILE AND HISTORY: Problem focused  assessment: Including review of records relating to presenting problem.  CLINICAL DECISION MAKING: LOW - limited treatment options, no task modification necessary  REHAB POTENTIAL: Good  EVALUATION COMPLEXITY: Low  PLAN:  OT FREQUENCY: 2x/week  OT DURATION: 8 weeks  PLANNED INTERVENTIONS: therapeutic exercise, neuromuscular re-education, manual therapy, passive range of motion, splinting, ultrasound, iontophoresis, cryotherapy, contrast bath, patient/family education, and DME and/or AE instructions   CONSULTED AND AGREED WITH PLAN OF CARE: Patient  Oletta Cohn, OTR/L,CLT  10/22/2022, 1:31 PM

## 2022-10-24 ENCOUNTER — Ambulatory Visit: Payer: BC Managed Care – PPO | Admitting: Occupational Therapy

## 2022-10-31 ENCOUNTER — Ambulatory Visit: Payer: BC Managed Care – PPO | Admitting: Occupational Therapy

## 2022-10-31 DIAGNOSIS — M25521 Pain in right elbow: Secondary | ICD-10-CM | POA: Diagnosis not present

## 2022-10-31 DIAGNOSIS — M6281 Muscle weakness (generalized): Secondary | ICD-10-CM | POA: Diagnosis not present

## 2022-10-31 DIAGNOSIS — M7701 Medial epicondylitis, right elbow: Secondary | ICD-10-CM

## 2022-10-31 NOTE — Therapy (Signed)
OUTPATIENT OCCUPATIONAL THERAPY ORTHO TREATMENT/10th visit   Patient Name: Monica Reed MRN: [id number] DOB:1970-11-20, 52 y.o., female   PCP: Dr Laury Deep PROVIDER: Dr Denyse Amass  END OF SESSION:  OT End of Session - 10/31/22 1218     Visit Number 10    Number of Visits 20    Date for OT Re-Evaluation 12/12/22    OT Start Time 1115    OT Stop Time 1207    OT Time Calculation (min) 52 min    Activity Tolerance Patient tolerated treatment well    Behavior During Therapy [facility] for tasks assessed/performed             Past Medical History:  Diagnosis Date   Frequent UTI    Hypertension    No past surgical history on file. Patient Active Problem List   Diagnosis Date Noted   Fear of flying 05/05/2022   Cervical disc disease 07/15/2021   Fredrickson type IIa hyperlipoproteinemia 05/10/2021   Degenerative tear of acetabular labrum of right hip 12/21/2020   DDD (degenerative disc disease), lumbar 10/10/2020   Vitamin D deficiency 04/30/2020   Hyperlipidemia 09/29/2018   Mass of arm, right 06/23/2018   Overweight with body mass index (BMI) of 29 to 29.9 in adult 09/02/2017   Migraine 05/29/2017   Mild recurrent major depression (HCC) 04/24/2017   Anxiety 12/23/2014   Primary hypertension 03/26/2013    ONSET DATE: 6 month ago  REFERRING DIAG: R medial epicondylitis   THERAPY DIAG:  Medial epicondylitis of right elbow  Pain in right elbow  Muscle weakness (generalized)  Rationale for Evaluation and Treatment: Rehabilitation  SUBJECTIVE:  SUBJECTIVE STATEMENT: I was doing much better- and then I thought that the 1/2 day light duty will help but it is still repetition - just lighter - the days that I don't work pain is better - but end of my work days of 10 hrs - it increase to 7/10     Pt accompanied by: self  PERTINENT HISTORY:DR Corey's note 08/15/22: 52 y.o. female with right medial elbow pain thought to be due to medial epicondylitis. Plan  to refer to occupational therapy.  She lives in [place] so we will use the [facility]. Recommend Voltaren gel and wrist strap or thumb lift with work. We talked about injection versus prednisone today.  Will try course of oral prednisone.  PRECAUTIONS: None  WEIGHT BEARING RESTRICTIONS: No  PAIN:  Coming in sitting no pain and with use less than 2/10 today FALLS: Has patient fallen in last 6 months? No  LIVING ENVIRONMENT: Lives with: lives with their family   PLOF: never had injury or surgery to R UE- had some pain after using crutches in the past  PATIENT GOALS: I want my pain better my right arm so that I can do my job, holding a grandbaby do things around the house without pain  OBJECTIVE:   HAND DOMINANCE: Right  UPPER EXTREMITY ROM:     AT EVAL : Active range of motion in right digits as well as elbow and wrist within normal limits.  Patient with increased pain with wrist flexion, radial and ulnar deviation as well as supination pronation.  Increased pain with gripping and twisting.  As well as increased pain with 3-point pinch. Slight pull with pain over medial epicondyle and volar forearm with wrist extension stretch.   10/14/22:  GRIP R 25, L 50 lbs. Lat grip 9 and L 12 lbs and 3 point  R 6 and L 12 lbs - pain with 3 point pinch and grip on R   SENSATION: At eval - Patient report numbness and pins-and-needles at times in the fifth finger.  Patient has a positive Tinel over the cubital tunnel-but tenderness mostly at the medial epicondyle more than the cubital tunnel.  10/14/22: Negative Tinel and tenderness over the cubital tunnel. No numbness-  7/10 tenderness over medial epicondyle  EDEMA: Patient with increased edema and inflammation on the medial elbow  COGNITION: Overall cognitive status: Within functional limits for tasks assessed  OBSERVATIONS: Patient with tenderness over medial epicondyle.   TODAY'S TREATMENT:                                                                                                                               DATE:10/31/2022: Tenderness at medial epicondyle 7/10  coming in was not seen for about 9 days Tenderness over volar forearm  last week 9/10 to 1/10 Review with pt activity at work - light duty task need to make sure keep wrist neutral with activities - and stretch out inbetween Pain was 10/10 when starting - even resting - made progress- but missed few sessions in early Sept and this past week again - mom fell Pt's work activities - do slow progress down - unable to modify activities    Contrast for 8 min - alternate hot and cold to decrease edema and pain at R elbow Recommend patient to perform at home 2-3 times a day if possible.   Soft tissue mobs to volar forearm - using graston tool nr 2 sweeping and MC spreads   Following contrast: gentle stretches add for forearm flexors again - in neutral and supination  5 reps hold 5 sec  Cont counter force strap at work - wrist splint at home  And try Voltaren again - used it prior to therapy earlier this year  Do that days that she works   Discussed work modifications and alternating activities when she can, she feels it is difficult to modify some of the tasks but has been trying to find positions and ways to hold, pick up and manage items.     Iontophoresis: Iontophoresis with dexamethasone with a medium patch at 2.0 current for 19 minutes performed on right medial epicondyle.  Patient tolerated well skin check done prior and pt to keep patch on for hour after treatment.   PATIENT EDUCATION: Education details: Findings of evaluation and home program Person educated: Patient Education method: Explanation, Demonstration, Tactile cues, Verbal cues, and Handouts Education comprehension: verbalized understanding, returned demonstration, verbal cues required, tactile cues required, and needs further education   GOALS: Goals reviewed with patient?  Yes  LONG TERM GOALS: 6 wks   Resting pain at right elbow decreased to less than 2/10 for patient to discharge  wrist brace as well as initiated extended elbow stretches Baseline: 8/10 pain that can increase with use to more than 10/10  pain.  Tenderness.  Patient fitted with a wrist support brace as well as a counterforce strap to use most of the time.  Elbow pad fabricated for cushioning over medial epicondyle and cubital tunnel no stretches initiate because of increased pain NOW pain at rest 0/10 if not working - increase to 2-3/10 - end of work day still 7/10 Goal status: PROGRESSING  2.  Pain right elbow decrease for patient to tolerate initiating strengthening with pain less than 2/10 Baseline: Resting pain 8/10 can increase to 10/10 with use.  Patient fitted with a wrist splint as well as a counterforce strap.  As well as elbow pad to wear at nighttime as well as when sitting for cushioning.  No strengthening or stretches- NOW pain end of work day 7/10 - off days rest 0/10 and use 2-3/10 - tolerate stretches but not strengthening  Goal status: PROGRESSING  3.  Patient is right grip and prehension strength within normal limits for her age with pain less than a 2/10. Baseline: Not tested tender 8/10 pain over medial epicondyle with increased pain with grip increasing 10/10.  No stretches or stretch exercises initiated NOW grip 23lbs, L 50- prehension decrease- pain mostly with grip and 3 point pinch  Goal status: PROGRESSING  4.  Patient able to hold grandbaby as well as wipe table ,bathing ,dress and cook with pain less than 2/10 Baseline: Pain at rest 8/10 that increased to 10/10 with any activities at work as well as picking up grandbaby-cannot do that anymore as well as bathing and dressing and wiping the table.  Increased pain with sweeping and cooking NOW pain less 2-6/10 with use at home - depending of was day she worked 10 hrs  Goal status: PROGRESSING ASSESSMENT:  CLINICAL  IMPRESSION: Patient present at occupational therapy evaluation with a diagnosis of right dominant hand medial epicondylitis.  Patient  at eval with resting pain 8/10 with increased tenderness and pain with wrist flexors as well as supination /pronation.  Increased pain with gripping and 3-point pinch.  Pain can increase to 10/10 with use.  Patient with increased pain limiting her functional use in ADLs, IADLs as well as work.  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  Pt seen for 10 visits with some periods of missing because schedule and her mom fell - Pain at rest on days if not working 0/10 at times and use 2-4/10 - but work days pain increase to 8/10 at end of 10 hrs - even with 5hrs of light duty. Pt cont to wear counterforce strap at work  and wrist splint at home - her  cubital tunnel symptoms decreased. Patient tenderness mostly at the medial epicondyle.  Pt tolerae Iontophoresis with dexamethasone  well.    Pt can benefit from skilled OT services to decrease pain and increase motion and strength to be able to use right dominant hand in ADLs and IADLs as well as work without increase symptoms.  PERFORMANCE DEFICITS: in functional skills including ADLs, IADLs, sensation, edema, ROM, strength, pain, flexibility, and UE functional use,  , and psychosocial skills including habits and routines and behaviors.   IMPAIRMENTS: are limiting patient from ADLs, IADLs, rest and sleep, work, play, leisure, and social participation.   COMORBIDITIES: has no other co-morbidities that affects occupational performance. Patient will benefit from skilled OT to address above impairments and improve overall function.  MODIFICATION OR ASSISTANCE TO COMPLETE EVALUATION: No modification of tasks or assist necessary to complete an evaluation.  OT OCCUPATIONAL PROFILE AND HISTORY: Problem focused assessment: Including review of records relating to presenting problem.  CLINICAL DECISION MAKING: LOW - limited  treatment options, no task modification necessary  REHAB POTENTIAL: Good  EVALUATION COMPLEXITY: Low  PLAN:  OT FREQUENCY: 2x/week  OT DURATION: 8 weeks  PLANNED INTERVENTIONS: therapeutic exercise, neuromuscular re-education, manual therapy, passive range of motion, splinting, ultrasound, iontophoresis, cryotherapy, contrast bath, patient/family education, and DME and/or AE instructions   CONSULTED AND AGREED WITH PLAN OF CARE: Patient  Oletta Cohn, OTR/L,CLT  10/31/2022, 12:23 PM

## 2022-11-07 ENCOUNTER — Ambulatory Visit: Payer: BC Managed Care – PPO | Admitting: Occupational Therapy

## 2022-11-15 ENCOUNTER — Ambulatory Visit: Payer: BC Managed Care – PPO | Admitting: Occupational Therapy

## 2022-11-15 DIAGNOSIS — M7701 Medial epicondylitis, right elbow: Secondary | ICD-10-CM | POA: Diagnosis not present

## 2022-11-15 DIAGNOSIS — M6281 Muscle weakness (generalized): Secondary | ICD-10-CM

## 2022-11-15 DIAGNOSIS — M25521 Pain in right elbow: Secondary | ICD-10-CM

## 2022-11-15 NOTE — Therapy (Signed)
OUTPATIENT OCCUPATIONAL THERAPY ORTHO TREATMENT/10th visit   Patient Name: Monica Reed MRN: [id number] DOB:June 22, 1970, 52 y.o., female   PCP: Dr Laury Deep PROVIDER: Dr Denyse Amass  END OF SESSION:  OT End of Session - 11/15/22 0940     Visit Number 11    Number of Visits 20    Date for OT Re-Evaluation 12/12/22    OT Start Time 0903    OT Stop Time 0955    OT Time Calculation (min) 52 min    Activity Tolerance Patient tolerated treatment well    Behavior During Therapy [facility] for tasks assessed/performed             Past Medical History:  Diagnosis Date   Frequent UTI    Hypertension    No past surgical history on file. Patient Active Problem List   Diagnosis Date Noted   Fear of flying 05/05/2022   Cervical disc disease 07/15/2021   Fredrickson type IIa hyperlipoproteinemia 05/10/2021   Degenerative tear of acetabular labrum of right hip 12/21/2020   DDD (degenerative disc disease), lumbar 10/10/2020   Vitamin D deficiency 04/30/2020   Hyperlipidemia 09/29/2018   Mass of arm, right 06/23/2018   Overweight with body mass index (BMI) of 29 to 29.9 in adult 09/02/2017   Migraine 05/29/2017   Mild recurrent major depression (HCC) 04/24/2017   Anxiety 12/23/2014   Primary hypertension 03/26/2013    ONSET DATE: 6 month ago  REFERRING DIAG: R medial epicondylitis   THERAPY DIAG:  Medial epicondylitis of right elbow  Pain in right elbow  Muscle weakness (generalized)  Rationale for Evaluation and Treatment: Rehabilitation  SUBJECTIVE:  SUBJECTIVE STATEMENT: I was doing much better- and then I thought that the 1/2 day light duty will help but it is still repetition - just lighter - the days that I don't work pain is better - but end of my work days of 10 hrs - it increase to 7/10     Pt accompanied by: self  PERTINENT HISTORY:DR Corey's note 08/15/22: 52 y.o. female with right medial elbow pain thought to be due to medial epicondylitis. Plan  to refer to occupational therapy.  She lives in [place] so we will use the [facility]. Recommend Voltaren gel and wrist strap or thumb lift with work. We talked about injection versus prednisone today.  Will try course of oral prednisone.  PRECAUTIONS: None  WEIGHT BEARING RESTRICTIONS: No  PAIN:  Coming in sitting no pain and with use less than 2/10 today FALLS: Has patient fallen in last 6 months? No  LIVING ENVIRONMENT: Lives with: lives with their family   PLOF: never had injury or surgery to R UE- had some pain after using crutches in the past  PATIENT GOALS: I want my pain better my right arm so that I can do my job, holding a grandbaby do things around the house without pain  OBJECTIVE:   HAND DOMINANCE: Right  UPPER EXTREMITY ROM:     AT EVAL : Active range of motion in right digits as well as elbow and wrist within normal limits.  Patient with increased pain with wrist flexion, radial and ulnar deviation as well as supination pronation.  Increased pain with gripping and twisting.  As well as increased pain with 3-point pinch. Slight pull with pain over medial epicondyle and volar forearm with wrist extension stretch.   10/14/22:  GRIP R 25, L 50 lbs. Lat grip 9 and L 12 lbs and 3 point  R 6 and L 12 lbs - pain with 3 point pinch and grip on R   SENSATION: At eval - Patient report numbness and pins-and-needles at times in the fifth finger.  Patient has a positive Tinel over the cubital tunnel-but tenderness mostly at the medial epicondyle more than the cubital tunnel.  10/14/22: Negative Tinel and tenderness over the cubital tunnel. No numbness-  7/10 tenderness over medial epicondyle  EDEMA: Patient with increased edema and inflammation on the medial elbow  COGNITION: Overall cognitive status: Within functional limits for tasks assessed  OBSERVATIONS: Patient with tenderness over medial epicondyle.   TODAY'S TREATMENT:                                                                                                                               DATE:11/15/2022:  I know I did not see you for about 2 weeks.  I am doing okay much better if I am just at home.  Even the light duty at work causes my elbow pain and swelling.  The other day they told me to slow down and that helped little bit. The numbness is better that I felt the my pinky but the pain on the days I work is still there. Tenderness at medial epicondyle 7/10    Review with pt activity at work - light duty task need to make sure keep wrist neutral with activities - and stretch out inbetween Pt's work activities - do slow progress down - unable to modify activities  Also reviewed with patient playing and taking care of her grandbabies-picking them up and holding them   Contrast for 8 min - alternate hot and cold to decrease edema and pain at R elbow Recommend patient to perform at home 2-3 times a day if possible.   Soft tissue mobs to volar forearm - using graston tool nr 2 sweeping and MC spreads   Following contrast: gentle stretches  forearm flexors extended arm- in neutral and supination  5 reps hold 5 sec  Tolerating well Cont counter force strap at work - wrist splint at home  Patient had been using Voltaren      Discussed work modifications and alternating activities when she can, she feels it is difficult to modify some of the tasks but has been trying to find positions and ways to hold, pick up and manage items.     Iontophoresis: Iontophoresis with dexamethasone with a medium patch at 2.0 current for 19 minutes performed on right medial epicondyle.  Patient tolerated well skin check done prior and pt to keep patch on for hour after treatment.   PATIENT EDUCATION: Education details: Findings of evaluation and home program Person educated: Patient Education method: Explanation, Demonstration, Tactile cues, Verbal cues, and Handouts Education comprehension: verbalized  understanding, returned demonstration, verbal cues required, tactile cues required, and needs further education   GOALS: Goals reviewed with patient? Yes  LONG TERM GOALS: 6 wks  Resting pain at right elbow decreased to less than 2/10 for patient to discharge  wrist brace as well as initiated extended elbow stretches Baseline: 8/10 pain that can increase with use to more than 10/10 pain.  Tenderness.  Patient fitted with a wrist support brace as well as a counterforce strap to use most of the time.  Elbow pad fabricated for cushioning over medial epicondyle and cubital tunnel no stretches initiate because of increased pain NOW pain at rest 0/10 if not working - increase to 2-3/10 - end of work day still 7/10 Goal status: PROGRESSING  2.  Pain right elbow decrease for patient to tolerate initiating strengthening with pain less than 2/10 Baseline: Resting pain 8/10 can increase to 10/10 with use.  Patient fitted with a wrist splint as well as a counterforce strap.  As well as elbow pad to wear at nighttime as well as when sitting for cushioning.  No strengthening or stretches- NOW pain end of work day 7/10 - off days rest 0/10 and use 2-3/10 - tolerate stretches but not strengthening  Goal status: PROGRESSING  3.  Patient is right grip and prehension strength within normal limits for her age with pain less than a 2/10. Baseline: Not tested tender 8/10 pain over medial epicondyle with increased pain with grip increasing 10/10.  No stretches or stretch exercises initiated NOW grip 23lbs, L 50- prehension decrease- pain mostly with grip and 3 point pinch  Goal status: PROGRESSING  4.  Patient able to hold grandbaby as well as wipe table ,bathing ,dress and cook with pain less than 2/10 Baseline: Pain at rest 8/10 that increased to 10/10 with any activities at work as well as picking up grandbaby-cannot do that anymore as well as bathing and dressing and wiping the table.  Increased pain with  sweeping and cooking NOW pain less 2-6/10 with use at home - depending of was day she worked 10 hrs  Goal status: PROGRESSING ASSESSMENT:  CLINICAL IMPRESSION: Patient present at occupational therapy evaluation with a diagnosis of right dominant hand medial epicondylitis.  Patient  at eval with resting pain 8/10 with increased tenderness and pain with wrist flexors as well as supination /pronation.  Increased pain with gripping and 3-point pinch.  Pain can increase to 10/10 with use.  Patient with increased pain limiting her functional use in ADLs, IADLs as well as work.  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  Pt seen for 11 visits with some periods of missing because schedule and her mom fell - Pain at rest on days if not working 0/10 at times and use 2-4/10 - but work days pain increase to 8/10 at end of 10 hrs - even with 5hrs of light duty. Pt cont to wear counterforce strap at work  and wrist splint at home - her  cubital tunnel symptoms decreased.  Had extended conversation with patient about progress and with her work activities repetitive gripping and pulling and pushing and squeezing.  As well as picking up and holding grandbabies.  Recommend for patient to touch base with her physician again.  If possibly need a shot.  But need to make sure she is off for a few days after words.  As well as reinforced again modifications at work and at home.        PERFORMANCE DEFICITS: in functional skills including ADLs, IADLs, sensation, edema, ROM, strength, pain, flexibility, and UE functional use,  , and psychosocial skills including habits and  routines and behaviors.   IMPAIRMENTS: are limiting patient from ADLs, IADLs, rest and sleep, work, play, leisure, and social participation.   COMORBIDITIES: has no other co-morbidities that affects occupational performance. Patient will benefit from skilled OT to address above impairments and improve overall function.  MODIFICATION OR ASSISTANCE TO  COMPLETE EVALUATION: No modification of tasks or assist necessary to complete an evaluation.  OT OCCUPATIONAL PROFILE AND HISTORY: Problem focused assessment: Including review of records relating to presenting problem.  CLINICAL DECISION MAKING: LOW - limited treatment options, no task modification necessary  REHAB POTENTIAL: Good  EVALUATION COMPLEXITY: Low  PLAN:  OT FREQUENCY: 2x/week  OT DURATION: 8 weeks  PLANNED INTERVENTIONS: therapeutic exercise, neuromuscular re-education, manual therapy, passive range of motion, splinting, ultrasound, iontophoresis, cryotherapy, contrast bath, patient/family education, and DME and/or AE instructions   CONSULTED AND AGREED WITH PLAN OF CARE: Patient  Oletta Cohn, OTR/L,CLT  11/15/2022, 10:39 AM

## 2023-01-06 ENCOUNTER — Other Ambulatory Visit: Payer: BC Managed Care – PPO

## 2023-01-13 ENCOUNTER — Encounter: Payer: BC Managed Care – PPO | Admitting: Family Medicine

## 2023-01-20 ENCOUNTER — Other Ambulatory Visit: Payer: BC Managed Care – PPO

## 2023-01-23 ENCOUNTER — Encounter: Payer: BC Managed Care – PPO | Admitting: Family Medicine

## 2023-01-28 ENCOUNTER — Encounter: Payer: Self-pay | Admitting: Family Medicine

## 2023-01-28 ENCOUNTER — Ambulatory Visit (INDEPENDENT_AMBULATORY_CARE_PROVIDER_SITE_OTHER): Payer: BC Managed Care – PPO | Admitting: Family Medicine

## 2023-01-28 ENCOUNTER — Other Ambulatory Visit: Payer: BC Managed Care – PPO

## 2023-01-28 VITALS — BP 119/76 | HR 71 | Temp 98.0°F | Ht 60.0 in | Wt 142.0 lb

## 2023-01-28 DIAGNOSIS — Z1159 Encounter for screening for other viral diseases: Secondary | ICD-10-CM

## 2023-01-28 DIAGNOSIS — E663 Overweight: Secondary | ICD-10-CM | POA: Diagnosis not present

## 2023-01-28 DIAGNOSIS — Z0000 Encounter for general adult medical examination without abnormal findings: Secondary | ICD-10-CM

## 2023-01-28 DIAGNOSIS — E7800 Pure hypercholesterolemia, unspecified: Secondary | ICD-10-CM | POA: Diagnosis not present

## 2023-01-28 DIAGNOSIS — Z6829 Body mass index (BMI) 29.0-29.9, adult: Secondary | ICD-10-CM

## 2023-01-28 DIAGNOSIS — I10 Essential (primary) hypertension: Secondary | ICD-10-CM | POA: Diagnosis not present

## 2023-01-28 DIAGNOSIS — F330 Major depressive disorder, recurrent, mild: Secondary | ICD-10-CM

## 2023-01-28 MED ORDER — ROSUVASTATIN CALCIUM 5 MG PO TABS: 5.0000 mg | ORAL_TABLET | Freq: Every day | ORAL | 0 refills | Status: DC

## 2023-01-28 MED ORDER — LISINOPRIL-HYDROCHLOROTHIAZIDE 20-12.5 MG PO TABS: 1.0000 | ORAL_TABLET | Freq: Every day | ORAL | 0 refills | Status: DC

## 2023-01-28 MED ORDER — FLUOXETINE HCL 20 MG PO CAPS: 60.0000 mg | ORAL_CAPSULE | Freq: Every day | ORAL | 0 refills | Status: DC

## 2023-01-28 MED ORDER — PHENTERMINE HCL 37.5 MG PO TABS: 37.5000 mg | ORAL_TABLET | Freq: Every day | ORAL | 2 refills | Status: DC

## 2023-01-28 NOTE — Progress Notes (Signed)
 Complete physical exam  Patient: Monica Reed   DOB: 01-31-1970   53 y.o. Female  MRN: [id number]  Subjective:    Chief Complaint  Patient presents with   Annual Exam    Saran Laviolette Garrette is a 53 y.o. female who presents today for a complete physical exam. She reports consuming a general diet.  She tries to exercise routinely as well.  She generally feels well. She reports sleeping well. She does not have additional problems to discuss today.    Most recent fall risk assessment:    01/28/2023    1:28 PM  Fall Risk   Falls in the past year? 0  Number falls in past yr: 0  Injury with Fall? 0  Risk for fall due to : No Fall Risks  Follow up Falls evaluation completed     Most recent depression and anxiety screenings:    01/28/2023    1:29 PM 10/08/2022    9:56 AM  PHQ 2/9 Scores  PHQ - 2 Score 0 0  PHQ- 9 Score 0 0      01/28/2023    1:29 PM 01/28/2022    1:25 PM 11/29/2021   10:33 AM 11/13/2021    1:22 PM  GAD 7 : Generalized Anxiety Score  Nervous, Anxious, on Edge 0 0 0 0  Control/stop worrying 0 0 0 0  Worry too much - different things 0 0 0 0  Trouble relaxing 0 0 0 0  Restless 0 0 0 0  Easily annoyed or irritable 0 0 0 0  Afraid - awful might happen 0 0 0 0  Total GAD 7 Score 0 0 0 0  Anxiety Difficulty Not difficult at all Not difficult at all Not difficult at all     Patient Active Problem List   Diagnosis Date Noted   Fear of flying 05/05/2022   Cervical disc disease 07/15/2021   Fredrickson type IIa hyperlipoproteinemia 05/10/2021   Degenerative tear of acetabular labrum of right hip 12/21/2020   DDD (degenerative disc disease), lumbar 10/10/2020   Vitamin D  deficiency 04/30/2020   Hyperlipidemia 09/29/2018   Mass of arm, right 06/23/2018   Overweight with body mass index (BMI) of 29 to 29.9 in adult 09/02/2017   Migraine 05/29/2017   Mild recurrent major depression (HCC) 04/24/2017   Anxiety 12/23/2014   Primary hypertension 03/26/2013     History reviewed. No pertinent surgical history. Social History   Tobacco Use   Smoking status: Never    Passive exposure: Never   Smokeless tobacco: Never  Vaping Use   Vaping status: Never Used  Substance Use Topics   Alcohol use: No   Drug use: No   Family History  Problem Relation Age of Onset   Arthritis Mother    Cancer Father        prostate   Heart attack Father    Healthy Sister    Healthy Brother    Hypertension Daughter    Healthy Brother    Healthy Daughter    Healthy Daughter    Allergies  Allergen Reactions   Cyclobenzaprine Swelling and Anaphylaxis    Had difficulty swallowing after taking  Had difficulty swallowing after taking      Patient Care Team: Wallace Joesph LABOR, PA as PCP - General (Family Medicine) Sarrah Browning, MD as Consulting Physician (Obstetrics and Gynecology)   Outpatient Medications Prior to Visit  Medication Sig   gabapentin  (NEURONTIN ) 100 MG capsule TAKE 1-3 CAPSULES (100-300 MG  TOTAL) BY MOUTH 3 (THREE) TIMES DAILY AS NEEDED.   loratadine (CLARITIN) 10 MG tablet Take 10 mg by mouth daily.   norethindrone-ethinyl estradiol (LOESTRIN FE) 1-20 MG-MCG tablet Take 1 tablet by mouth daily.   [DISCONTINUED] FLUoxetine  (PROZAC ) 20 MG capsule TAKE 3 CAPSULES BY MOUTH EVERY DAY   [DISCONTINUED] lisinopril -hydrochlorothiazide  (ZESTORETIC ) 20-12.5 MG tablet TAKE 1 TABLET BY MOUTH EVERY DAY   [DISCONTINUED] phentermine  (ADIPEX-P ) 37.5 MG tablet Take 1 tablet (37.5 mg total) by mouth daily before breakfast.   [DISCONTINUED] rosuvastatin  (CRESTOR ) 5 MG tablet Take 1 tablet (5 mg total) by mouth daily.   [DISCONTINUED] ALPRAZolam  (XANAX ) 0.5 MG tablet Take 1 tablet po two hours prior to flight. May take 1 tablet po one hour prior to flight PRN. May take 1 tablet po upon boarding flight PRN. May take 1 tablet po during flight if needed. (Patient not taking: Reported on 01/28/2023)   No facility-administered medications prior to visit.     Review of Systems  Constitutional:  Negative for chills, fever and malaise/fatigue.  HENT:  Negative for congestion and hearing loss.   Eyes:  Negative for blurred vision and double vision.  Respiratory:  Negative for cough and shortness of breath.   Cardiovascular:  Negative for chest pain, palpitations and leg swelling.  Gastrointestinal:  Negative for abdominal pain, constipation, diarrhea and heartburn.  Genitourinary:  Negative for frequency and urgency.  Musculoskeletal:  Negative for myalgias and neck pain.  Neurological:  Negative for headaches.  Endo/Heme/Allergies:  Negative for polydipsia.  Psychiatric/Behavioral:  Negative for depression. The patient is not nervous/anxious and does not have insomnia.       Objective:    BP 119/76   Pulse 71   Temp 98 F (36.7 C) (Oral)   Ht 5' (1.524 m)   Wt 142 lb 0.6 oz (64.4 kg)   SpO2 98%   BMI 27.74 kg/m    Physical Exam Constitutional:      General: She is not in acute distress.    Appearance: Normal appearance.  HENT:     Head: Normocephalic and atraumatic.     Right Ear: Tympanic membrane, ear canal and external ear normal.     Left Ear: Tympanic membrane, ear canal and external ear normal.     Nose: Nose normal.     Mouth/Throat:     Mouth: Mucous membranes are moist.     Pharynx: No oropharyngeal exudate or posterior oropharyngeal erythema.  Eyes:     Extraocular Movements: Extraocular movements intact.     Conjunctiva/sclera: Conjunctivae normal.     Pupils: Pupils are equal, round, and reactive to light.     Comments: Not wearing glasses or contacts.  She will lose his license for reading.  Neck:     Thyroid: No thyroid mass, thyromegaly or thyroid tenderness.  Cardiovascular:     Rate and Rhythm: Normal rate and regular rhythm.     Heart sounds: Normal heart sounds. No murmur heard.    No friction rub. No gallop.  Pulmonary:     Effort: Pulmonary effort is normal. No respiratory distress.     Breath  sounds: Normal breath sounds. No wheezing, rhonchi or rales.  Abdominal:     General: Abdomen is flat. Bowel sounds are normal. There is no distension.     Palpations: There is no mass.     Tenderness: There is no abdominal tenderness. There is no guarding.  Musculoskeletal:        General: Normal range of  motion.     Cervical back: Normal range of motion and neck supple.  Lymphadenopathy:     Cervical: No cervical adenopathy.  Skin:    General: Skin is warm and dry.  Neurological:     Mental Status: She is alert and oriented to person, place, and time.     Cranial Nerves: No cranial nerve deficit.     Motor: No weakness.     Deep Tendon Reflexes: Reflexes normal.  Psychiatric:        Mood and Affect: Mood normal.        Assessment & Plan:    Routine Health Maintenance and Physical Exam  Immunization History  Administered Date(s) Administered   PFIZER(Purple Top)SARS-COV-2 Vaccination 05/13/2019, 06/03/2019   Tdap 12/23/2014   Unspecified SARS-COV-2 Vaccination 04/22/2019   Zoster Recombinant(Shingrix ) 07/05/2021, 10/29/2021    Health Maintenance  Topic Date Due   HIV Screening  Never done   Hepatitis C Screening  Never done   Cervical Cancer Screening (HPV/Pap Cotest)  08/06/2021   COVID-19 Vaccine (4 - 2024-25 season) 09/22/2022   MAMMOGRAM  11/17/2022   INFLUENZA VACCINE  04/21/2023 (Originally 08/22/2022)   Fecal DNA (Cologuard)  11/21/2024   DTaP/Tdap/Td (2 - Td or Tdap) 12/22/2024   Zoster Vaccines- Shingrix   Completed   HPV VACCINES  Aged Out    This morning, collected labs including CBC, CMP, lipid panel, A1C, TSH, and vitamin D .  She has an upcoming appointment with [facility] to update Pap smear and mammogram.  PCP will request copies at that time.  Discussed health benefits of physical activity, and encouraged her to engage in regular exercise appropriate for her age and condition.  Wellness examination  Elevated LDL cholesterol level -      Rosuvastatin  Calcium ; Take 1 tablet (5 mg total) by mouth daily.  Dispense: 90 tablet; Refill: 0  Hypertension, unspecified type -     Lisinopril -hydroCHLOROthiazide ; Take 1 tablet by mouth daily.  Dispense: 90 tablet; Refill: 0  Overweight with body mass index (BMI) of 29 to 29.9 in adult -     Phentermine  HCl; Take 1 tablet (37.5 mg total) by mouth daily before breakfast.  Dispense: 30 tablet; Refill: 2  Mild recurrent major depression (HCC) -     FLUoxetine  HCl; Take 3 capsules (60 mg total) by mouth daily.  Dispense: 270 capsule; Refill: 0  Continue routine medications unless lab results warrant change.  Return in about 3 months (around 04/28/2023) for follow-up for weight management (phentermine ).     Joesph DELENA Sear, PA

## 2023-01-29 LAB — COMPREHENSIVE METABOLIC PANEL
ALT: 9 [IU]/L (ref 0–32)
AST: 13 [IU]/L (ref 0–40)
Albumin: 4.2 g/dL (ref 3.8–4.9)
Alkaline Phosphatase: 57 [IU]/L (ref 44–121)
BUN/Creatinine Ratio: 18 (ref 9–23)
BUN: 12 mg/dL (ref 6–24)
Bilirubin Total: 0.6 mg/dL (ref 0.0–1.2)
CO2: 26 mmol/L (ref 20–29)
Calcium: 8.6 mg/dL — ABNORMAL LOW (ref 8.7–10.2)
Chloride: 100 mmol/L (ref 96–106)
Creatinine, Ser: 0.65 mg/dL (ref 0.57–1.00)
Globulin, Total: 2.5 g/dL (ref 1.5–4.5)
Glucose: 74 mg/dL (ref 70–99)
Potassium: 3.8 mmol/L (ref 3.5–5.2)
Sodium: 139 mmol/L (ref 134–144)
Total Protein: 6.7 g/dL (ref 6.0–8.5)
eGFR: 106 mL/min/{1.73_m2} (ref >59)

## 2023-01-29 LAB — HEMOGLOBIN A1C
Est. average glucose Bld gHb Est-mCnc: 111 mg/dL
Hgb A1c MFr Bld: 5.5 % (ref 4.8–5.6)

## 2023-01-29 LAB — CBC WITH DIFFERENTIAL/PLATELET
Basophils Absolute: 0 10*3/uL (ref 0.0–0.2)
Basos: 1 %
EOS (ABSOLUTE): 0.1 10*3/uL (ref 0.0–0.4)
Eos: 2 %
Hematocrit: 43.3 % (ref 34.0–46.6)
Hemoglobin: 14.6 g/dL (ref 11.1–15.9)
Immature Grans (Abs): 0 10*3/uL (ref 0.0–0.1)
Immature Granulocytes: 1 %
Lymphocytes Absolute: 2.3 10*3/uL (ref 0.7–3.1)
Lymphs: 32 %
MCH: 31.5 pg (ref 26.6–33.0)
MCHC: 33.7 g/dL (ref 31.5–35.7)
MCV: 93 fL (ref 79–97)
Monocytes Absolute: 0.4 10*3/uL (ref 0.1–0.9)
Monocytes: 6 %
Neutrophils Absolute: 4.3 10*3/uL (ref 1.4–7.0)
Neutrophils: 58 %
Platelets: 316 10*3/uL (ref 150–450)
RBC: 4.64 x10E6/uL (ref 3.77–5.28)
RDW: 12.4 % (ref 11.7–15.4)
WBC: 7.2 10*3/uL (ref 3.4–10.8)

## 2023-01-29 LAB — LIPID PANEL
Chol/HDL Ratio: 4.2 {ratio} (ref 0.0–4.4)
Cholesterol, Total: 222 mg/dL — ABNORMAL HIGH (ref 100–199)
HDL: 53 mg/dL (ref >39)
LDL Chol Calc (NIH): 150 mg/dL — ABNORMAL HIGH (ref 0–99)
Triglycerides: 104 mg/dL (ref 0–149)
VLDL Cholesterol Cal: 19 mg/dL (ref 5–40)

## 2023-01-29 LAB — HEPATITIS C ANTIBODY: Hep C Virus Ab: NONREACTIVE

## 2023-01-29 LAB — TSH RFX ON ABNORMAL TO FREE T4: TSH: 1.34 u[IU]/mL (ref 0.450–4.500)

## 2023-01-29 LAB — HIV ANTIBODY (ROUTINE TESTING W REFLEX): HIV Screen 4th Generation wRfx: NONREACTIVE

## 2023-01-29 LAB — VITAMIN D 25 HYDROXY (VIT D DEFICIENCY, FRACTURES): Vit D, 25-Hydroxy: 31.6 ng/mL (ref 30.0–100.0)

## 2023-02-05 DIAGNOSIS — Z124 Encounter for screening for malignant neoplasm of cervix: Secondary | ICD-10-CM | POA: Diagnosis not present

## 2023-02-05 DIAGNOSIS — Z1231 Encounter for screening mammogram for malignant neoplasm of breast: Secondary | ICD-10-CM | POA: Diagnosis not present

## 2023-02-05 DIAGNOSIS — Z01419 Encounter for gynecological examination (general) (routine) without abnormal findings: Secondary | ICD-10-CM | POA: Diagnosis not present

## 2023-02-11 ENCOUNTER — Other Ambulatory Visit: Payer: Self-pay | Admitting: Obstetrics and Gynecology

## 2023-02-11 DIAGNOSIS — R928 Other abnormal and inconclusive findings on diagnostic imaging of breast: Secondary | ICD-10-CM

## 2023-02-24 ENCOUNTER — Encounter: Payer: Self-pay | Admitting: Family Medicine

## 2023-02-24 ENCOUNTER — Ambulatory Visit: Payer: BC Managed Care – PPO | Admitting: Family Medicine

## 2023-02-24 VITALS — BP 124/82 | HR 73 | Ht 60.0 in | Wt 146.0 lb

## 2023-02-24 DIAGNOSIS — M7701 Medial epicondylitis, right elbow: Secondary | ICD-10-CM

## 2023-02-24 MED ORDER — LORAZEPAM 0.5 MG PO TABS: ORAL_TABLET | ORAL | 0 refills | Status: AC

## 2023-02-24 NOTE — Progress Notes (Signed)
   I, Stevenson Clinch, CMA acting as a scribe for Monica Graham, MD.  Monica Reed is a 53 y.o. female who presents to [facility] at [facility] today for cont'd R arm pain thought to be do to medial epicondylitis. Pt was last seen by Dr. Denyse Amass on 08/15/22 and was prescribed prednisone, advised to use Voltaren gel, and was referred to OT, completing 11 visits.  Today, pt reports continued right arm pain. Notes some improvement while in OT but sx returned once going back to work. Minimal improvement with oral Prednisone. Pain now radiating into the lower arm, feels cramped at times. Notes weakness in the arm and decreased grip strength. Notes n/t in the 5th finger. Taking Gabapentin at bedtime, helps some.  She is  very anxious about the idea of getting an injection.  Dx imaging: 08/15/21 C-spine MRI             05/25/21 C-spine XR  Pertinent review of systems: No fevers or chills  Relevant historical information: History of depression.  Works a physically demanding job   Exam:  BP 124/82   Pulse 73   Ht 5' (1.524 m)   Wt 146 lb (66.2 kg)   SpO2 98%   BMI 28.51 kg/m  General: Well Developed, well nourished, and in no acute distress.   MSK: Right arm normal-appearing Tender palpation medial epicondyle.  Normal elbow motion. Pain with resisted wrist flexion felt in the medial epicondyle.    Assessment and Plan: 53 y.o. female with right medial elbow pain due to medial epicondylitis.  Not improved with conservative management including occupational therapy and home exercise program over the last longer than 3 months. We discussed options.  Plan for MRI to further clarify source of pain and for potential injection or surgical planning. She has needle phobia so anticipate potential lorazepam prior to injection.  I prescribed this medicine now.  She will need a driver if she does take it.   PDMP reviewed during this encounter. Orders Placed This Encounter   Procedures   MR ELBOW RIGHT WO CONTRAST    Standing Status:   Future    Expiration Date:   02/24/2024    What is the patient's sedation requirement?:   No Sedation    Does the patient have a pacemaker or implanted devices?:   No    Preferred imaging location?:   [facility] (table limit-350lbs)   Meds ordered this encounter  Medications   LORazepam (ATIVAN) 0.5 MG tablet    Sig: 1-2 tabs 30 - 60 min prior to injection. Do not drive with this medicine.    Dispense:  4 tablet    Refill:  0     Discussed warning signs or symptoms. Please see discharge instructions. Patient expresses understanding.   The above documentation has been reviewed and is accurate and complete Monica Reed, M.D.

## 2023-02-24 NOTE — Patient Instructions (Signed)
Thank you for coming in today.   You should hear from MRI scheduling within 1 week. If you do not hear please let me know.    Once I get the results back I likely will recommend coming in to talk about it and likely an injection.   You can take the ativan before the injection if it is something we are going to do. Make sure you have a drive you if you take this medicine.

## 2023-02-26 ENCOUNTER — Other Ambulatory Visit: Payer: BC Managed Care – PPO

## 2023-02-27 ENCOUNTER — Encounter: Payer: Self-pay | Admitting: Family Medicine

## 2023-03-03 ENCOUNTER — Other Ambulatory Visit: Payer: BC Managed Care – PPO

## 2023-03-10 ENCOUNTER — Ambulatory Visit: Payer: BC Managed Care – PPO

## 2023-03-10 DIAGNOSIS — G8929 Other chronic pain: Secondary | ICD-10-CM | POA: Diagnosis not present

## 2023-03-10 DIAGNOSIS — M7701 Medial epicondylitis, right elbow: Secondary | ICD-10-CM

## 2023-03-10 DIAGNOSIS — S56211A Strain of other flexor muscle, fascia and tendon at forearm level, right arm, initial encounter: Secondary | ICD-10-CM | POA: Diagnosis not present

## 2023-03-10 DIAGNOSIS — M25521 Pain in right elbow: Secondary | ICD-10-CM | POA: Diagnosis not present

## 2023-03-11 ENCOUNTER — Ambulatory Visit
Admission: RE | Admit: 2023-03-11 | Discharge: 2023-03-11 | Disposition: A | Discharge status: Home / Self Care | Payer: BC Managed Care – PPO | Source: Ambulatory Visit | Attending: Obstetrics and Gynecology | Admitting: Obstetrics and Gynecology

## 2023-03-11 DIAGNOSIS — R928 Other abnormal and inconclusive findings on diagnostic imaging of breast: Secondary | ICD-10-CM

## 2023-03-11 DIAGNOSIS — N6311 Unspecified lump in the right breast, upper outer quadrant: Secondary | ICD-10-CM | POA: Diagnosis not present

## 2023-03-17 ENCOUNTER — Encounter: Payer: Self-pay | Admitting: Family Medicine

## 2023-03-17 DIAGNOSIS — M7701 Medial epicondylitis, right elbow: Secondary | ICD-10-CM

## 2023-03-17 NOTE — Progress Notes (Signed)
 Right elbow MRI shows partial-thickness tear at the medial epicondyle which would look a lot like medial epicondylitis or golfer's elbow.  I could do an injection which could help.  If so we will need to give you some antianxiety medicine ahead of time.  Please let me know what you would like.

## 2023-03-17 NOTE — Telephone Encounter (Signed)
 Forwarding to Dr. Denyse Amass to review and advise.

## 2023-03-24 ENCOUNTER — Encounter: Payer: Self-pay | Admitting: Family Medicine

## 2023-03-25 NOTE — Telephone Encounter (Signed)
 Forwarding to Dr. Denyse Amass to review and advise.

## 2023-03-31 ENCOUNTER — Telehealth: Payer: Self-pay

## 2023-03-31 NOTE — Telephone Encounter (Signed)
 Working on [employer] form, additional information needed from patient.   Called Kendra and left VM to call the office.

## 2023-03-31 NOTE — Telephone Encounter (Signed)
Form completed and placed on Dr. Corey's desk to review and sign.  

## 2023-03-31 NOTE — Telephone Encounter (Signed)
 Pt returned call, intermittent leave 1-3 x weekly.

## 2023-03-31 NOTE — Telephone Encounter (Signed)
 FMLA form to be completed  Last OV 02/23/33

## 2023-04-01 NOTE — Telephone Encounter (Signed)
 Forms faxed successfully to [phone], sent to scan.

## 2023-04-01 NOTE — Telephone Encounter (Signed)
 Form reviewed and signed by Dr. Denyse Amass and placed at the front desk for faxing/scanning.

## 2023-04-07 NOTE — Telephone Encounter (Signed)
 Spoke with patient, she says that the Gabapentin has been helpful, she has been taking 100 mg at bedtime. She is unable to take during the day because it makes her too drowsy and she cannot function at work.   Forwarding to Dr. Denyse Amass to review and advise.

## 2023-04-07 NOTE — Telephone Encounter (Signed)
 Called pt at [phone] and left VM to call the office.

## 2023-04-08 MED ORDER — MELOXICAM 15 MG PO TABS: ORAL_TABLET | ORAL | 3 refills | Status: DC

## 2023-04-08 NOTE — Telephone Encounter (Signed)
 Fax received requesting clarification on FMLA form.

## 2023-04-08 NOTE — Telephone Encounter (Signed)
 Forwarding to Dr. Denyse Amass to review and advise.

## 2023-04-08 NOTE — Addendum Note (Signed)
 Addended by: Rodolph Bong on: 04/08/2023 06:36 AM   Modules accepted: Orders

## 2023-04-09 NOTE — Progress Notes (Unsigned)
   Rubin Payor, PhD, LAT, ATC acting as a scribe for [name], MD.  Monica Reed is a 53 y.o. female who presents to [facility] at [facility] today for L arm pain x ***. Pt locates pain to ***  Radiates: Paresthesia: Grip strength: Aggravates: Treatments tried:  Pertinent review of systems: ***  Relevant historical information: ***   Exam:  There were no vitals taken for this visit. General: Well Developed, well nourished, and in no acute distress.   MSK: ***    Lab and Radiology Results No results found for this or any previous visit (from the past 72 hours). No results found.     Assessment and Plan: 53 y.o. female with ***   PDMP not reviewed this encounter. No orders of the defined types were placed in this encounter.  No orders of the defined types were placed in this encounter.    Discussed warning signs or symptoms. Please see discharge instructions. Patient expresses understanding.   ***

## 2023-04-09 NOTE — Telephone Encounter (Signed)
 Faxed

## 2023-04-09 NOTE — Telephone Encounter (Signed)
 Clarification documentation provided, form placed at the front desk for faxing/scanning.

## 2023-04-10 ENCOUNTER — Ambulatory Visit: Admitting: Family Medicine

## 2023-04-10 ENCOUNTER — Encounter: Payer: Self-pay | Admitting: Family Medicine

## 2023-04-10 ENCOUNTER — Other Ambulatory Visit: Payer: Self-pay

## 2023-04-10 VITALS — BP 134/88 | HR 67 | Ht 60.0 in

## 2023-04-10 DIAGNOSIS — M79602 Pain in left arm: Secondary | ICD-10-CM | POA: Diagnosis not present

## 2023-04-10 MED ORDER — PREDNISONE 50 MG PO TABS: ORAL_TABLET | ORAL | 0 refills | Status: DC

## 2023-04-10 MED ORDER — NITROGLYCERIN 0.2 MG/HR TD PT24: 0.2000 mg | MEDICATED_PATCH | Freq: Every day | TRANSDERMAL | 1 refills | Status: AC

## 2023-04-10 NOTE — Patient Instructions (Addendum)
 Thank you for coming in today.   Take the meloxicam, as previously prescribed.  Back up prescriptions, prednisone and nitroglycerin patches, have been sent back to your pharmacy.  Nitroglycerin Protocol  Apply 1/4 nitroglycerin patch to affected area daily. Change position of patch within the affected area every 24 hours. You may experience a headache during the first 1-2 weeks of using the patch, these should subside. If you experience headaches after beginning nitroglycerin patch treatment, you may take your preferred over the counter pain reliever. Another side effect of the nitroglycerin patch is skin irritation or rash related to patch adhesive. Please notify our office if you develop more severe headaches or rash, and stop the patch. Tendon healing with nitroglycerin patch may require 12 to 24 weeks depending on the extent of injury. Men should not use if taking Viagra, Cialis, or Levitra.  Do not use if you have migraines or rosacea.

## 2023-04-22 ENCOUNTER — Ambulatory Visit: Payer: BC Managed Care – PPO | Admitting: Orthopedic Surgery

## 2023-04-22 DIAGNOSIS — M7711 Lateral epicondylitis, right elbow: Secondary | ICD-10-CM | POA: Diagnosis not present

## 2023-04-22 DIAGNOSIS — M7701 Medial epicondylitis, right elbow: Secondary | ICD-10-CM | POA: Diagnosis not present

## 2023-04-22 NOTE — Progress Notes (Signed)
 Monica Reed [place] - 53 y.o. female MRN [id number]  Date of birth: [birth year]  Office Visit Note: Visit Date: 04/22/2023 PCP: Melida Quitter, PA Referred by: Rodolph Bong, MD  Subjective: No chief complaint on file.  HPI: Monica Reed is a pleasant 53 y.o. female who presents today for evaluation of ongoing right elbow pain that is migratory in nature, she describes a history of lateral elbow pain with migration to the more medial aspect, also describing occasional numbness and tingling from the medial aspect of the elbow radiating towards the small finger.  She has done extensive therapy, counterforce bracing and activity modification with persistent symptoms.  She does work in a [employer] and does heavy lifting and repetitive labor which is likely contributing to her ongoing problem.  Pertinent ROS were reviewed with the patient and found to be negative unless otherwise specified above in HPI.   Visit Reason: right elbow pain, occasional numbness and tingling Duration of symptoms: 1 year Hand dominance: right Occupation: Company secretary Diabetic: No Smoking: No Heart/Lung History: None Blood Thinners: None  Prior Testing/EMG: MRI 03/15/23 Injections (Date): None Treatments: OT for right elbow lateral epicondylitis Prior Surgery: None    Assessment & Plan: Visit Diagnoses:  1. Lateral epicondylitis, right elbow   2. Medial epicondylitis of elbow, right     Plan: Extensive discussion was had with the patient today regarding her ongoing right lateral epicondylitis and medial epicondylitis.  I reviewed the results of her MRI as well in detail of the right elbow.    On today's examination, her major issue is the medial epicondylitis.  I did explain that there is likely an element of some cubital tunnel syndrome as well which is intermittent in nature and is likely responded to some of the occupational therapy that she previously underwent.  I did explain that if  the numbness and tingling should persist or worsen throughout the right arm with radiation into the ulnar aspect of the hand, she would be an appropriate candidate for electrodiagnostic study in order to help diagnose any potential nerve compression in this region.  As for the medial epicondylitis, she does have some tenderness in this region today, I did offer her a cortisone injection in this region today which she declined.  She would like to continue with conservative measures and activity modification for the time being.  She will return to me in the future should the symptoms worsen for repeat discussion.   I spent 45 minutes in the care of this patient today including review of previous documentation, imaging reviewed, face-to-face time discussing all options regarding treatment and documenting the encounter.   Follow-up: No follow-ups on file.   Meds & Orders: No orders of the defined types were placed in this encounter.  No orders of the defined types were placed in this encounter.    Procedures: No procedures performed      Clinical History: No specialty comments available.  She reports that she has never smoked. She has never been exposed to tobacco smoke. She has never used smokeless tobacco.  Recent Labs    01/28/23 0904  HGBA1C 5.5    Objective:   Vital Signs: There were no vitals taken for this visit.  Physical Exam  Gen: Well-appearing, in no acute distress; non-toxic CV: Regular Rate. Well-perfused. Warm.  Resp: Breathing unlabored on room air; no wheezing. Psych: Fluid speech in conversation; appropriate affect; normal thought process  Ortho Exam PHYSICAL EXAM:  General: Patient  is well appearing and in no distress.  Skin and Muscle: No significant skin changes are apparent to hands or elbows.  Muscle bulk and contour normal, no signs of atrophy.     Range of Motion and Palpation Tests: Mobility is full about the elbows with flexion and extension.   Forearm supination and pronation are 85/85 bilaterally.  Wrist flexion/extension is 75/65 bilaterally.  Digital flexion and extension are full.  Thumb opposition is full to the base of the small fingers bilaterally.    Moderate tenderness over the medial epicondyle right elbow, pain with resisted wrist flexion as well  Minimal tenderness over the lateral aspect of the right elbow, no significant pain with resisted wrist extension   Neurologic, Vascular, Motor: Sensation is intact to light touch in the median/radial/ulnar distributions.  Tinel's testing mildly positive at cubital tunnel right elbow.  AIN/PIN/interosseous intact. Fingers pink and well perfused.  Capillary refill is brisk.      Lab Results  Component Value Date   HGBA1C 5.5 01/28/2023     Imaging: Prior right elbow MRI was reviewed in detail  Past Medical/Family/Surgical/Social History: Medications & Allergies reviewed per EMR, new medications updated. Patient Active Problem List   Diagnosis Date Noted   Fear of flying 05/05/2022   Cervical disc disease 07/15/2021   Fredrickson type IIa hyperlipoproteinemia 05/10/2021   Degenerative tear of acetabular labrum of right hip 12/21/2020   DDD (degenerative disc disease), lumbar 10/10/2020   Vitamin D deficiency 04/30/2020   Hyperlipidemia 09/29/2018   Mass of arm, right 06/23/2018   Overweight with body mass index (BMI) of 29 to 29.9 in adult 09/02/2017   Migraine 05/29/2017   Mild recurrent major depression (HCC) 04/24/2017   Anxiety 12/23/2014   Primary hypertension 03/26/2013   Past Medical History:  Diagnosis Date   Frequent UTI    Hypertension    Family History  Problem Relation Age of Onset   Arthritis Mother    Cancer Father        prostate   Heart attack Father    Healthy Sister    Healthy Brother    Hypertension Daughter    Healthy Brother    Healthy Daughter    Healthy Daughter    No past surgical history on file. Social History    Occupational History   Not on file  Tobacco Use   Smoking status: Never    Passive exposure: Never   Smokeless tobacco: Never  Vaping Use   Vaping status: Never Used  Substance and Sexual Activity   Alcohol use: No   Drug use: No   Sexual activity: Yes    Partners: Male    Birth control/protection: Pill    Juanita Devincent Fara Boros) Shawne Eskelson, M.D. [facility], Hand Surgery

## 2023-05-05 ENCOUNTER — Other Ambulatory Visit: Payer: Self-pay | Admitting: Family Medicine

## 2023-05-05 DIAGNOSIS — F330 Major depressive disorder, recurrent, mild: Secondary | ICD-10-CM

## 2023-07-24 ENCOUNTER — Ambulatory Visit

## 2023-07-24 VITALS — BP 132/83 | HR 70 | Temp 97.9°F | Ht 60.0 in | Wt 150.1 lb

## 2023-07-24 DIAGNOSIS — L309 Dermatitis, unspecified: Secondary | ICD-10-CM | POA: Insufficient documentation

## 2023-07-24 DIAGNOSIS — L2082 Flexural eczema: Secondary | ICD-10-CM | POA: Diagnosis not present

## 2023-07-24 DIAGNOSIS — E663 Overweight: Secondary | ICD-10-CM | POA: Diagnosis not present

## 2023-07-24 DIAGNOSIS — Z6829 Body mass index (BMI) 29.0-29.9, adult: Secondary | ICD-10-CM

## 2023-07-24 MED ORDER — PHENTERMINE HCL 37.5 MG PO TABS: 37.5000 mg | ORAL_TABLET | Freq: Every day | ORAL | 2 refills | Status: DC

## 2023-07-24 MED ORDER — TRIAMCINOLONE ACETONIDE 0.025 % EX OINT: 1.0000 | TOPICAL_OINTMENT | Freq: Two times a day (BID) | CUTANEOUS | 0 refills | Status: DC

## 2023-07-24 NOTE — Assessment & Plan Note (Signed)
 Starting Weight: 151  Current weight: 150 Previous weight: 146 Change in weight: +4 pounds Goal weight: 140 Dietary goals: High-protein, high-fiber.  Avoiding fast/fried foods. Exercise goals: Increasing exercise as tolerated to include walking Medication: Phentermine  37.5 mg daily refilled today. Follow-up and referrals: Follow-up in 3 months to reassess weight loss

## 2023-07-24 NOTE — Patient Instructions (Addendum)
 It was nice to see you today!  As we discussed in clinic   -I have sent in refills for the phentermine  37.5 mg.  Continue taking this daily to augment weight loss.  Keep up the great work! - I have also sent in triamcinolone  ointment to use over the rash on your arms.  This should work better than the hydrocortisone.  Use this twice a day for 2 weeks, then you can use it as needed for flareups.  If the rash is not getting better, please send a MyChart message and let me know and we can try a stronger steroid cream.  I will see you back in 3 months for follow-up.  It was nice to meet you!  If you have any problems before your next visit feel free to message me via MyChart (minor issues or questions) or call the office, otherwise you may reach out to schedule an office visit.  Thank you! Saddie Sacks, PA-C

## 2023-07-24 NOTE — Progress Notes (Signed)
 Established Patient Office Visit  Subjective   Patient ID: Monica Reed, female    DOB: [birth year]  Age: 53 y.o. MRN: [id number]  Chief Complaint  Patient presents with   Medication Management    HPI  Emya Picado Faas is a 53 y.o. y/o female who presents to the clinic today for follow up on weight management. Patient currently taking 37.5 mg phentermine  daily for weight loss. Reports she is taking this medication daily. Denies palpitations, insomnia.   Reports that she has been out of this medication for over a month, so she has gained a few pounds during this time.  Reports that she is overall happy with the medication and would like to continue at the current dose and reassess weight loss in 3 months.  Reports that she is exercising as tolerated, however due to knee/arm pain she is not able to exercise as much as she would like.  Does report that she is eating a cleaner diet and avoiding fast food/fried foods.  Patient also reports an approximately quarter sized rash on both aspects of her dorsal forearms.  Reports that the rash comes and goes, it is itchy when it flares up, and is not responding to over-the-counter hydrocortisone 1% cream.  Denies the rash spreading or recurring anywhere else on her body.    ROS Per HPI.    Objective:     BP 132/83   Pulse 70   Temp 97.9 F (36.6 C) (Oral)   Ht 5' (1.524 m)   Wt 150 lb 1.3 oz (68.1 kg)   SpO2 98%   BMI 29.31 kg/m    Physical Exam Constitutional:      General: She is not in acute distress.    Appearance: Normal appearance.  Cardiovascular:     Rate and Rhythm: Normal rate and regular rhythm.     Heart sounds: Normal heart sounds. No murmur heard.    No friction rub. No gallop.  Pulmonary:     Effort: Pulmonary effort is normal. No respiratory distress.     Breath sounds: Normal breath sounds.  Musculoskeletal:        General: No swelling.  Skin:    General: Skin is warm and dry.     Findings: Rash  (Approximately quarter sized area of erythema, scaling of the dorsal aspect of bilateral forearms.) present.  Neurological:     General: No focal deficit present.     Mental Status: She is alert.  Psychiatric:        Mood and Affect: Mood normal.        Behavior: Behavior normal.        Thought Content: Thought content normal.      No results found for any visits on 07/24/23.    The 10-year ASCVD risk score (Arnett DK, et al., 2019) is: 2.8%    Assessment & Plan:   Flexural eczema Assessment & Plan: Start triamcinolone  0.025% cream twice daily for 2 weeks over the affected areas.  Advised patient not to combine this medication with hydrocortisone 1% that she has been using over-the-counter.  After using consistently for 2 weeks, okay to use as needed for eczema flares.   Overweight with body mass index (BMI) of 29 to 29.9 in adult Assessment & Plan: Starting Weight: 151  Current weight: 150 Previous weight: 146 Change in weight: +4 pounds Goal weight: 140 Dietary goals: High-protein, high-fiber.  Avoiding fast/fried foods. Exercise goals: Increasing exercise as tolerated to include walking Medication: Phentermine   37.5 mg daily refilled today. Follow-up and referrals: Follow-up in 3 months to reassess weight loss   Orders: -     Phentermine  HCl; Take 1 tablet (37.5 mg total) by mouth daily before breakfast.  Dispense: 30 tablet; Refill: 2  Other orders -     Triamcinolone  Acetonide; Apply 1 Application topically 2 (two) times daily.  Dispense: 30 g; Refill: 0    Return in about 3 months (around 10/24/2023) for Weight.    [name], PA-C

## 2023-07-24 NOTE — Assessment & Plan Note (Signed)
 Start triamcinolone  0.025% cream twice daily for 2 weeks over the affected areas.  Advised patient not to combine this medication with hydrocortisone 1% that she has been using over-the-counter.  After using consistently for 2 weeks, okay to use as needed for eczema flares.

## 2023-08-18 ENCOUNTER — Other Ambulatory Visit: Payer: Self-pay | Admitting: Family Medicine

## 2023-08-18 NOTE — Telephone Encounter (Signed)
 Last OV 04/10/23 Next OV not schedule  Last refill 04/08/23 Qty #30/3   Renal labs 01/28/23  Component Ref Range & Units (hover) 6 mo ago (01/28/23) 2 yr ago (07/23/21) 2 yr ago (03/12/21) 3 yr ago (04/20/20) 4 yr ago (02/23/19) 4 yr ago (09/21/18)  Glucose 74 77 67 Low  75 R 80 R 83 R  BUN 12 11 12 16 10 10   Creatinine, Ser 0.65 0.72 0.67 0.73 0.83 0.59  eGFR 106 101 106 101    BUN/Creatinine Ratio 18 15 18 22 12 17   Sodium 139 138 137 137 142 138  Potassium 3.8 4.1 3.7 4.5 4.5 4.0  Chloride 100 101 101 98 102 102  CO2 26 24 22 22 27 23   Calcium  8.6 Low  9.3 8.7 9.6 9.5 8.4 Low   Total Protein 6.7 6.7  7.7 7.3 6.6  Albumin 4.2 4.3 CM  4.5 R 4.4 R 4.0 R  Globulin, Total 2.5 2.4  3.2 2.9 2.6  Bilirubin Total 0.6 0.4  0.4 0.3 0.3  Alkaline Phosphatase 57 65  62 57 R 51 R  AST 13 14  15 17 15   ALT 9 10  9 11  7

## 2023-09-06 ENCOUNTER — Other Ambulatory Visit: Payer: Self-pay | Admitting: Family Medicine

## 2023-09-06 DIAGNOSIS — I10 Essential (primary) hypertension: Secondary | ICD-10-CM

## 2023-09-23 ENCOUNTER — Encounter: Payer: Self-pay | Admitting: Sports Medicine

## 2023-10-28 ENCOUNTER — Ambulatory Visit

## 2023-11-24 ENCOUNTER — Encounter: Payer: Self-pay | Admitting: Radiology

## 2023-11-27 ENCOUNTER — Ambulatory Visit

## 2023-11-27 VITALS — BP 151/85 | HR 67 | Temp 98.4°F | Ht 60.0 in | Wt 150.0 lb

## 2023-11-27 DIAGNOSIS — E663 Overweight: Secondary | ICD-10-CM | POA: Diagnosis not present

## 2023-11-27 DIAGNOSIS — E785 Hyperlipidemia, unspecified: Secondary | ICD-10-CM

## 2023-11-27 DIAGNOSIS — Z6829 Body mass index (BMI) 29.0-29.9, adult: Secondary | ICD-10-CM

## 2023-11-27 DIAGNOSIS — F330 Major depressive disorder, recurrent, mild: Secondary | ICD-10-CM

## 2023-11-27 DIAGNOSIS — E7800 Pure hypercholesterolemia, unspecified: Secondary | ICD-10-CM | POA: Diagnosis not present

## 2023-11-27 DIAGNOSIS — I10 Essential (primary) hypertension: Secondary | ICD-10-CM

## 2023-11-27 MED ORDER — PHENTERMINE HCL 37.5 MG PO TABS: 37.5000 mg | ORAL_TABLET | Freq: Every day | ORAL | 2 refills | Status: AC

## 2023-11-27 MED ORDER — ROSUVASTATIN CALCIUM 5 MG PO TABS
5.0000 mg | ORAL_TABLET | Freq: Every day | ORAL | 0 refills | Status: AC
Start: 2023-11-27 — End: ?

## 2023-11-27 MED ORDER — FLUOXETINE HCL 20 MG PO CAPS: 40.0000 mg | ORAL_CAPSULE | Freq: Every day | ORAL | 2 refills | Status: DC

## 2023-11-27 NOTE — Assessment & Plan Note (Signed)
 BP goal <140/90.  Blood pressure is above goal today, however she reports that she forgot to take her blood pressure medication today due to keeping her grandchildren.  Discussed that her blood pressure at home normally is at goal.  Counseled on medication compliance and will reassess at her upcoming appointment.  Continue lisinopril -hydrochlorothiazide  20-12.5 mg daily for now.

## 2023-11-27 NOTE — Patient Instructions (Signed)
 It was nice to see you today!  As we discussed in clinic:  - Continue with all of your current medications as prescribed. I have sent refills on your phentermine , prozac , and rosuvastatin . - I will plan to see you back in 3 months for a physical with labs the week before!  If you have any problems before your next visit feel free to message me via MyChart (minor issues or questions) or call the office, otherwise you may reach out to schedule an office visit.  Thank you! Saddie Sacks, PA-C

## 2023-11-27 NOTE — Assessment & Plan Note (Signed)
 Stable on Prozac  40 mg daily.  Refill sent to pharmacy today.

## 2023-11-27 NOTE — Assessment & Plan Note (Signed)
 Starting Weight: 151   Current weight: 150 Previous weight: 150 Change in weight: 0 Goal weight: 140 Dietary goals: High-protein, high-fiber.  Avoiding fast/fried foods. Exercise goals: Increasing exercise as tolerated to include walking Medication: Phentermine  37.5 mg daily refilled today. Follow-up and referrals: Follow-up in 3 months to reassess weight loss

## 2023-11-27 NOTE — Progress Notes (Signed)
 Established Patient Office Visit  Subjective   Patient ID: Monica Reed, female    DOB: 10-31-1970  Age: 53 y.o. MRN: [id number]  Chief Complaint  Patient presents with   Medication Management    HPI  Monica Reed is a 53 year old female who presents to the clinic today for follow-up on hypertension and weight management.  Patient has been on phentermine  37.5 mg daily for several months now and is maintaining her current weight of 150.  She denies side effects from phentermine  and is tolerating the medication well.  She is combining this medication with low-calorie diet and regular physical activity. She is currently taking lisinopril -HCTZ 20-12.5 mg daily for blood pressure.  Does report that her blood pressure normally is at goal at home, however today she did forget to take her blood pressure medication and so it is elevated.  Denies side effect such as headache, vision changes, chest pain, swelling.   ROS Per HPI.    Objective:     BP (!) 151/85   Pulse 67   Temp 98.4 F (36.9 C) (Oral)   Ht 5' (1.524 m)   Wt 150 lb 0.6 oz (68.1 kg)   SpO2 100%   BMI 29.30 kg/m    Physical Exam Constitutional:      General: She is not in acute distress.    Appearance: Normal appearance.  Cardiovascular:     Rate and Rhythm: Normal rate and regular rhythm.     Heart sounds: Normal heart sounds. No murmur heard.    No friction rub. No gallop.  Pulmonary:     Effort: Pulmonary effort is normal. No respiratory distress.     Breath sounds: Normal breath sounds.  Abdominal:     General: Bowel sounds are normal.  Musculoskeletal:        General: No swelling.     Cervical back: Neck supple.  Lymphadenopathy:     Cervical: No cervical adenopathy.  Skin:    General: Skin is warm and dry.  Neurological:     General: No focal deficit present.     Mental Status: She is alert.  Psychiatric:        Mood and Affect: Mood normal.        Behavior: Behavior normal.         Thought Content: Thought content normal.      No results found for any visits on 11/27/23.    The 10-year ASCVD risk score (Arnett DK, et al., 2019) is: 3.6%    Assessment & Plan:   Hyperlipidemia, unspecified hyperlipidemia type Assessment & Plan: Will recheck lipid panel with labs before physical.  Continue rosuvastatin  5 mg daily for now.   Elevated LDL cholesterol level -     Rosuvastatin  Calcium ; Take 1 tablet (5 mg total) by mouth daily.  Dispense: 90 tablet; Refill: 0  Overweight with body mass index (BMI) of 29 to 29.9 in adult Assessment & Plan: Starting Weight: 151   Current weight: 150 Previous weight: 150 Change in weight: 0 Goal weight: 140 Dietary goals: High-protein, high-fiber.  Avoiding fast/fried foods. Exercise goals: Increasing exercise as tolerated to include walking Medication: Phentermine  37.5 mg daily refilled today. Follow-up and referrals: Follow-up in 3 months to reassess weight loss  Orders: -     Phentermine  HCl; Take 1 tablet (37.5 mg total) by mouth daily before breakfast.  Dispense: 30 tablet; Refill: 2  Mild recurrent major depression Assessment & Plan: Stable on Prozac  40 mg daily.  Refill sent to pharmacy today.  Orders: -     FLUoxetine  HCl; Take 2 capsules (40 mg total) by mouth daily.  Dispense: 90 capsule; Refill: 2  Primary hypertension Assessment & Plan: BP goal <140/90.  Blood pressure is above goal today, however she reports that she forgot to take her blood pressure medication today due to keeping her grandchildren.  Discussed that her blood pressure at home normally is at goal.  Counseled on medication compliance and will reassess at her upcoming appointment.  Continue lisinopril -hydrochlorothiazide  20-12.5 mg daily for now.     Return in about 3 months (around 02/27/2024) for Physical.    [name], PA-C

## 2023-11-27 NOTE — Assessment & Plan Note (Signed)
 Will recheck lipid panel with labs before physical.  Continue rosuvastatin  5 mg daily for now.

## 2023-12-01 ENCOUNTER — Encounter: Payer: Self-pay | Admitting: Family Medicine

## 2023-12-02 ENCOUNTER — Telehealth: Payer: Self-pay

## 2023-12-02 NOTE — Telephone Encounter (Signed)
 Update FMLA to extend through the end of 11/2023. F/U visit scheduled for 12/11/23.   See mychart message.

## 2023-12-02 NOTE — Telephone Encounter (Signed)
 Called and spoke with patient.   Last FMLA form was written to be in effect for 3 months but employer / HR accidentally wrote it up as 1 year. Now needs updated for to reflect current accommodations.   Pt scheduled OV for 12/11/23, will extend FMLA through the end of Nov and then complete new forms after f/u visit.

## 2023-12-03 NOTE — Telephone Encounter (Addendum)
 Form completed, placed on Dr. Virgilio desk to review and sign.   Will upload a copy to pt chart for pt to review once signed.

## 2023-12-04 NOTE — Telephone Encounter (Signed)
 Faxed, copy sent to scan and original placed in Brandy's box.

## 2023-12-04 NOTE — Telephone Encounter (Signed)
 Form reviewed and signed by Dr. Denyse Amass, placed at the front desk for faxing/scanning.

## 2023-12-09 ENCOUNTER — Other Ambulatory Visit: Payer: Self-pay

## 2023-12-10 ENCOUNTER — Other Ambulatory Visit: Payer: Self-pay

## 2023-12-10 DIAGNOSIS — I10 Essential (primary) hypertension: Secondary | ICD-10-CM

## 2023-12-11 ENCOUNTER — Other Ambulatory Visit: Payer: Self-pay

## 2023-12-11 ENCOUNTER — Ambulatory Visit: Admitting: Family Medicine

## 2023-12-11 ENCOUNTER — Telehealth: Payer: Self-pay

## 2023-12-11 VITALS — BP 150/88 | HR 90 | Ht 60.0 in | Wt 149.0 lb

## 2023-12-11 DIAGNOSIS — M79601 Pain in right arm: Secondary | ICD-10-CM | POA: Diagnosis not present

## 2023-12-11 DIAGNOSIS — M7701 Medial epicondylitis, right elbow: Secondary | ICD-10-CM | POA: Diagnosis not present

## 2023-12-11 NOTE — Progress Notes (Signed)
   LILLETTE Monica Collet, PhD, LAT, ATC acting as a scribe for Monica Lloyd, MD.  Monica Reed is a 53 y.o. Monica who presents to [facility] today for f/u R arm pain and to update her FMLA paper. Pt was last seen by Dr. Lloyd on 04/10/23 and was prescribed nitroglycerin  patches and prednisone .  Today, pt reports R arm is still painful, worsening over the last month. She locates pain to the medial aspect of the R elbow and until the cubital foss w/ burning into her R thumb.  +numbness 5th finger/MC. She notes weakness/heaviness esp at work and at night.   Dx testing: 03/10/23 R elbow MRI  Pertinent review of systems: No fevers or chills  Relevant historical information: Hypertension   Exam:  BP (!) 150/88   Pulse 90   Ht 5' (1.524 m)   Wt 149 lb (67.6 kg)   SpO2 99%   BMI 29.10 kg/m  General: Well Developed, well nourished, and in no acute distress.   MSK: Right forearm: Normal appearing. Tender palpation medial epicondyle. Strength is intact. Pulses cap refill and sensation are intact distally.    Lab and Radiology Results No results found for this or any previous visit (from the past 72 hours). No results found.     Assessment and Plan: 53 y.o. Monica with right forearm pain.  Medial elbow pain due to medial epicondylitis.  This is seen on MRI. Patient additionally has ulnar paresthesias concerning for cubital tunnel syndrome.  Plan for nerve conduction study.  Consider steroid injection if not better.  She would like to avoid injection if possible.  Plan for nerve conduction study and recheck after MRI.   PDMP not reviewed this encounter. Orders Placed This Encounter  Procedures   Ambulatory referral to Neurology    Referral Priority:   Routine    Referral Type:   Consultation    Referral Reason:   Specialty Services Required    Requested Specialty:   Neurology    Number of Visits Requested:   1   NCV with EMG(electromyography)     R UE    Standing Status:   Future    Expiration Date:   12/10/2024   No orders of the defined types were placed in this encounter.    Discussed warning signs or symptoms. Please see discharge instructions. Patient expresses understanding.   The above documentation has been reviewed and is accurate and complete Monica Reed, M.D.

## 2023-12-11 NOTE — Telephone Encounter (Signed)
 Updated FMLA form need to be completed and faxed back after re-eval 12/11/23.

## 2023-12-11 NOTE — Patient Instructions (Addendum)
 Thank you for coming in today.   I've referred you to Neurology for a Nerve Conduction Study.  Let us  know if you don't hear from them in one week.   Work paperwork completed  Check back once we get the Nerve Conduction Study results back.

## 2023-12-16 NOTE — Telephone Encounter (Signed)
 From:  Billi Bright Takahashi Sent:  12/11/2023  3:42 PM EST To:    Patient Medical Advice Request Message List  When you put all my paper, my restriction for me not lift anything over 20 can you also have them not having me push nothing over 20 pounds also thank you

## 2023-12-17 NOTE — Telephone Encounter (Signed)
 Form completed, placed on Dr. Virgilio desk to review and sign.   Pending NCV / EMG Intermittent time off to attend appointments.  Intermittent time off for sx exacerbation, 1-2 x weekly, 1 day per episode. No pushing, pulling, or lifting > 20 lbs Should remain in effect for 6 months.

## 2023-12-20 ENCOUNTER — Other Ambulatory Visit: Payer: Self-pay

## 2023-12-20 DIAGNOSIS — F330 Major depressive disorder, recurrent, mild: Secondary | ICD-10-CM

## 2023-12-22 NOTE — Telephone Encounter (Signed)
 Form reviewed and signed by Dr. Alease Hunter. Placed at the front desk for faxing/scanning.

## 2023-12-30 ENCOUNTER — Encounter: Payer: Self-pay | Admitting: Neurology

## 2024-02-17 ENCOUNTER — Other Ambulatory Visit: Payer: Self-pay

## 2024-02-17 DIAGNOSIS — E663 Overweight: Secondary | ICD-10-CM

## 2024-02-17 DIAGNOSIS — E785 Hyperlipidemia, unspecified: Secondary | ICD-10-CM

## 2024-02-17 DIAGNOSIS — I10 Essential (primary) hypertension: Secondary | ICD-10-CM

## 2024-02-17 DIAGNOSIS — E559 Vitamin D deficiency, unspecified: Secondary | ICD-10-CM

## 2024-02-20 ENCOUNTER — Other Ambulatory Visit

## 2024-02-20 DIAGNOSIS — E785 Hyperlipidemia, unspecified: Secondary | ICD-10-CM

## 2024-02-20 DIAGNOSIS — I10 Essential (primary) hypertension: Secondary | ICD-10-CM

## 2024-02-20 DIAGNOSIS — E559 Vitamin D deficiency, unspecified: Secondary | ICD-10-CM

## 2024-02-20 DIAGNOSIS — E663 Overweight: Secondary | ICD-10-CM

## 2024-02-21 LAB — CBC WITH DIFFERENTIAL/PLATELET
Basophils Absolute: 0 10*3/uL (ref 0.0–0.2)
Basos: 0 %
EOS (ABSOLUTE): 0.2 10*3/uL (ref 0.0–0.4)
Eos: 2 %
Hematocrit: 42.3 % (ref 34.0–46.6)
Hemoglobin: 14.2 g/dL (ref 11.1–15.9)
Immature Grans (Abs): 0 10*3/uL (ref 0.0–0.1)
Immature Granulocytes: 0 %
Lymphocytes Absolute: 2.8 10*3/uL (ref 0.7–3.1)
Lymphs: 36 %
MCH: 31.5 pg (ref 26.6–33.0)
MCHC: 33.6 g/dL (ref 31.5–35.7)
MCV: 94 fL (ref 79–97)
Monocytes Absolute: 0.6 10*3/uL (ref 0.1–0.9)
Monocytes: 7 %
Neutrophils Absolute: 4.3 10*3/uL (ref 1.4–7.0)
Neutrophils: 55 %
Platelets: 304 10*3/uL (ref 150–450)
RBC: 4.51 x10E6/uL (ref 3.77–5.28)
RDW: 12.3 % (ref 11.7–15.4)
WBC: 7.9 10*3/uL (ref 3.4–10.8)

## 2024-02-21 LAB — HEMOGLOBIN A1C
Est. average glucose Bld gHb Est-mCnc: 111 mg/dL
Hgb A1c MFr Bld: 5.5 % (ref 4.8–5.6)

## 2024-02-21 LAB — COMPREHENSIVE METABOLIC PANEL WITH GFR
ALT: 11 [IU]/L (ref 0–32)
AST: 19 [IU]/L (ref 0–40)
Albumin: 4.5 g/dL (ref 3.8–4.9)
Alkaline Phosphatase: 76 [IU]/L (ref 49–135)
BUN/Creatinine Ratio: 16 (ref 9–23)
BUN: 11 mg/dL (ref 6–24)
Bilirubin Total: 1 mg/dL (ref 0.0–1.2)
CO2: 25 mmol/L (ref 20–29)
Calcium: 9.3 mg/dL (ref 8.7–10.2)
Chloride: 97 mmol/L (ref 96–106)
Creatinine, Ser: 0.7 mg/dL (ref 0.57–1.00)
Globulin, Total: 2.4 g/dL (ref 1.5–4.5)
Glucose: 79 mg/dL (ref 70–99)
Potassium: 3.7 mmol/L (ref 3.5–5.2)
Sodium: 138 mmol/L (ref 134–144)
Total Protein: 6.9 g/dL (ref 6.0–8.5)
eGFR: 103 mL/min/{1.73_m2}

## 2024-02-21 LAB — LIPID PANEL
Chol/HDL Ratio: 3.8 ratio (ref 0.0–4.4)
Cholesterol, Total: 254 mg/dL — ABNORMAL HIGH (ref 100–199)
HDL: 67 mg/dL
LDL Chol Calc (NIH): 175 mg/dL — ABNORMAL HIGH (ref 0–99)
Triglycerides: 75 mg/dL (ref 0–149)
VLDL Cholesterol Cal: 12 mg/dL (ref 5–40)

## 2024-02-21 LAB — TSH: TSH: 1.46 u[IU]/mL (ref 0.450–4.500)

## 2024-02-21 LAB — VITAMIN D 25 HYDROXY (VIT D DEFICIENCY, FRACTURES): Vit D, 25-Hydroxy: 18.3 ng/mL — ABNORMAL LOW (ref 30.0–100.0)

## 2024-02-23 ENCOUNTER — Ambulatory Visit: Payer: Self-pay

## 2024-02-27 ENCOUNTER — Ambulatory Visit

## 2024-03-02 ENCOUNTER — Ambulatory Visit

## 2024-03-09 ENCOUNTER — Encounter: Admitting: Neurology
